# Patient Record
Sex: Female | Born: 1945 | Race: Black or African American | Hispanic: No | Marital: Single | Smoking: Never smoker
Health system: Southern US, Community
[De-identification: ages and names within clinical notes are randomized; demographics above are authoritative.]

## PROBLEM LIST (undated history)

## (undated) DIAGNOSIS — I471 Supraventricular tachycardia, unspecified: Secondary | ICD-10-CM

## (undated) DIAGNOSIS — E559 Vitamin D deficiency, unspecified: Secondary | ICD-10-CM

## (undated) DIAGNOSIS — R000 Tachycardia, unspecified: Secondary | ICD-10-CM

## (undated) DIAGNOSIS — I6523 Occlusion and stenosis of bilateral carotid arteries: Secondary | ICD-10-CM

## (undated) DIAGNOSIS — I2510 Atherosclerotic heart disease of native coronary artery without angina pectoris: Secondary | ICD-10-CM

## (undated) DIAGNOSIS — M545 Low back pain, unspecified: Secondary | ICD-10-CM

## (undated) DIAGNOSIS — K635 Polyp of colon: Secondary | ICD-10-CM

## (undated) DIAGNOSIS — R32 Unspecified urinary incontinence: Secondary | ICD-10-CM

## (undated) DIAGNOSIS — I5189 Other ill-defined heart diseases: Secondary | ICD-10-CM

## (undated) DIAGNOSIS — R202 Paresthesia of skin: Secondary | ICD-10-CM

## (undated) DIAGNOSIS — F419 Anxiety disorder, unspecified: Secondary | ICD-10-CM

## (undated) DIAGNOSIS — E538 Deficiency of other specified B group vitamins: Secondary | ICD-10-CM

## (undated) DIAGNOSIS — N8110 Cystocele, unspecified: Secondary | ICD-10-CM

## (undated) DIAGNOSIS — E039 Hypothyroidism, unspecified: Secondary | ICD-10-CM

## (undated) DIAGNOSIS — K219 Gastro-esophageal reflux disease without esophagitis: Secondary | ICD-10-CM

## (undated) DIAGNOSIS — E669 Obesity, unspecified: Secondary | ICD-10-CM

## (undated) DIAGNOSIS — G459 Transient cerebral ischemic attack, unspecified: Secondary | ICD-10-CM

## (undated) DIAGNOSIS — Z7902 Long term (current) use of antithrombotics/antiplatelets: Secondary | ICD-10-CM

## (undated) DIAGNOSIS — I639 Cerebral infarction, unspecified: Secondary | ICD-10-CM

## (undated) DIAGNOSIS — J45909 Unspecified asthma, uncomplicated: Secondary | ICD-10-CM

## (undated) DIAGNOSIS — G8929 Other chronic pain: Secondary | ICD-10-CM

## (undated) DIAGNOSIS — F32A Depression, unspecified: Secondary | ICD-10-CM

## (undated) DIAGNOSIS — I10 Essential (primary) hypertension: Secondary | ICD-10-CM

## (undated) DIAGNOSIS — E785 Hyperlipidemia, unspecified: Secondary | ICD-10-CM

## (undated) DIAGNOSIS — N3281 Overactive bladder: Secondary | ICD-10-CM

## (undated) DIAGNOSIS — E119 Type 2 diabetes mellitus without complications: Secondary | ICD-10-CM

## (undated) DIAGNOSIS — M1990 Unspecified osteoarthritis, unspecified site: Secondary | ICD-10-CM

## (undated) HISTORY — DX: Supraventricular tachycardia: I47.1

## (undated) HISTORY — DX: Atherosclerotic heart disease of native coronary artery without angina pectoris: I25.10

## (undated) HISTORY — PX: CARPAL TUNNEL RELEASE: SHX101

## (undated) HISTORY — DX: Supraventricular tachycardia, unspecified: I47.10

## (undated) HISTORY — PX: ABDOMINAL HYSTERECTOMY: SHX81

## (undated) HISTORY — PX: CERVICAL FUSION: SHX112

## (undated) HISTORY — DX: Hyperlipidemia, unspecified: E78.5

## (undated) HISTORY — DX: Essential (primary) hypertension: I10

## (undated) HISTORY — DX: Tachycardia, unspecified: R00.0

## (undated) HISTORY — PX: TONSILLECTOMY: SUR1361

## (undated) HISTORY — PX: TUBAL LIGATION: SHX77

## (undated) HISTORY — PX: KNEE ARTHROSCOPY: SUR90

## (undated) SURGERY — ECHOCARDIOGRAM, TRANSESOPHAGEAL
Anesthesia: Choice

---

## 1997-05-16 HISTORY — PX: PARTIAL HYSTERECTOMY: SHX80

## 2005-01-13 ENCOUNTER — Ambulatory Visit: Payer: Self-pay | Admitting: General Practice

## 2005-05-28 ENCOUNTER — Emergency Department: Payer: Self-pay | Admitting: Emergency Medicine

## 2005-10-29 ENCOUNTER — Ambulatory Visit: Payer: Self-pay | Admitting: Orthopedic Surgery

## 2005-12-14 ENCOUNTER — Ambulatory Visit: Payer: Self-pay | Admitting: Orthopaedic Surgery

## 2008-05-16 DIAGNOSIS — R000 Tachycardia, unspecified: Secondary | ICD-10-CM

## 2008-05-16 HISTORY — DX: Tachycardia, unspecified: R00.0

## 2009-05-16 DIAGNOSIS — I2510 Atherosclerotic heart disease of native coronary artery without angina pectoris: Secondary | ICD-10-CM

## 2009-05-16 HISTORY — DX: Atherosclerotic heart disease of native coronary artery without angina pectoris: I25.10

## 2009-05-16 HISTORY — PX: CARDIAC CATHETERIZATION: SHX172

## 2012-05-02 ENCOUNTER — Emergency Department: Payer: Self-pay | Admitting: Emergency Medicine

## 2012-05-02 LAB — COMPREHENSIVE METABOLIC PANEL
Anion Gap: 7 (ref 7–16)
BUN: 13 mg/dL (ref 7–18)
Calcium, Total: 9.1 mg/dL (ref 8.5–10.1)
Co2: 26 mmol/L (ref 21–32)
EGFR (African American): >60
EGFR (Non-African Amer.): >60
Potassium: 3.3 mmol/L — ABNORMAL LOW (ref 3.5–5.1)
SGOT(AST): 25 U/L (ref 15–37)
Sodium: 140 mmol/L (ref 136–145)

## 2012-05-02 LAB — CBC
HCT: 43.8 % (ref 35.0–47.0)
HGB: 15.3 g/dL (ref 12.0–16.0)
MCH: 29.2 pg (ref 26.0–34.0)
MCHC: 34.9 g/dL (ref 32.0–36.0)
MCV: 83 fL (ref 80–100)
Platelet: 329 10*3/uL (ref 150–440)
RBC: 5.25 10*6/uL — ABNORMAL HIGH (ref 3.80–5.20)
RDW: 12.7 % (ref 11.5–14.5)
WBC: 5.8 10*3/uL (ref 3.6–11.0)

## 2012-05-02 LAB — MAGNESIUM: Magnesium: 1.8 mg/dL

## 2012-05-02 LAB — TROPONIN I: Troponin-I: 0.02 ng/mL

## 2012-05-02 LAB — CK TOTAL AND CKMB (NOT AT ARMC): CK, Total: 364 U/L — ABNORMAL HIGH (ref 21–215)

## 2012-05-14 ENCOUNTER — Ambulatory Visit (INDEPENDENT_AMBULATORY_CARE_PROVIDER_SITE_OTHER): Payer: BC Managed Care – PPO | Admitting: Cardiovascular Disease

## 2012-05-14 ENCOUNTER — Encounter: Payer: Self-pay | Admitting: Cardiovascular Disease

## 2012-05-14 VITALS — BP 140/80 | HR 71 | Ht 62.0 in | Wt 165.8 lb

## 2012-05-14 DIAGNOSIS — I471 Supraventricular tachycardia: Secondary | ICD-10-CM

## 2012-05-14 DIAGNOSIS — I2510 Atherosclerotic heart disease of native coronary artery without angina pectoris: Secondary | ICD-10-CM

## 2012-05-14 DIAGNOSIS — R002 Palpitations: Secondary | ICD-10-CM

## 2012-05-14 DIAGNOSIS — I10 Essential (primary) hypertension: Secondary | ICD-10-CM

## 2012-05-14 NOTE — Patient Instructions (Addendum)
Your physician has requested that you have an echocardiogram. Echocardiography is a painless test that uses sound waves to create images of your heart. It provides your doctor with information about the size and shape of your heart and how well your heart's chambers and valves are working. This procedure takes approximately one hour. There are no restrictions for this procedure.  Follow up in 3 months.  

## 2012-05-19 ENCOUNTER — Encounter: Payer: Self-pay | Admitting: Cardiovascular Disease

## 2012-05-19 DIAGNOSIS — I2510 Atherosclerotic heart disease of native coronary artery without angina pectoris: Secondary | ICD-10-CM | POA: Insufficient documentation

## 2012-05-19 DIAGNOSIS — E785 Hyperlipidemia, unspecified: Secondary | ICD-10-CM | POA: Insufficient documentation

## 2012-05-19 DIAGNOSIS — I471 Supraventricular tachycardia, unspecified: Secondary | ICD-10-CM | POA: Insufficient documentation

## 2012-05-19 DIAGNOSIS — I10 Essential (primary) hypertension: Secondary | ICD-10-CM | POA: Insufficient documentation

## 2012-05-19 NOTE — Assessment & Plan Note (Signed)
Her blood pressure is well controlled on current medications. 

## 2012-05-19 NOTE — Assessment & Plan Note (Signed)
The patient had a recent episode of supraventricular tachycardia. Her episodes overall doesn't seem to be very frequent. We discussed vagal maneuvers today to terminate the tachycardia if needed. She was recently started on diltiazem to take in addition to metoprolol. If she develops any further frequent episodes, an antiarrhythmic medication or catheter ablation can be considered. I will request an echocardiogram to make sure she does not have any structural heart abnormalities.

## 2012-05-19 NOTE — Progress Notes (Signed)
HPI  This is a 67 year old female who was referred from the emergency room at [facility] for evaluation of supraventricular tachycardia. The patient has known history of paroxysmal supraventricular tachycardia which was diagnosed about 2 years ago. Since that time she reports having 3 episodes that required emergency room visits and termination with adenosine. She has been evaluated at [facility] in the past and was treated with metoprolol. She had a cardiac catheterization done in 2011 which showed a 50% LAD stenosis without any other obstructive disease. She was evaluated with a nuclear stress test in October of 2013 due to atypical chest pain. The stress test was normal. She presented recently to [facility] with tachycardia and was noted to have narrow complex tachycardia with a heart rate of 140 beats per minute. She converted to normal sinus rhythm with adenosine. Her labs were unremarkable except for mild hypokalemia with a potassium of 3.3. Cardiac enzymes were normal. D-dimer and TSH were both normal as well. Chest x-ray showed no acute abnormalities. Diltiazem extended release 120 mg once daily was added. She denies any chest pain or dyspnea at this time.  Allergies  Allergen Reactions  . Sulfa Antibiotics      Current Outpatient Prescriptions on File Prior to Visit  Medication Sig Dispense Refill  . diltiazem (CARDIZEM) 120 MG tablet Take 120 mg by mouth every 12 (twelve) hours.      Marland Kitchen estrogens, conjugated, (PREMARIN) 0.3 MG tablet Take 0.3 mg by mouth daily. Take daily for 21 days then do not take for 7 days.      Marland Kitchen lovastatin (MEVACOR) 40 MG tablet Take 40 mg by mouth at bedtime.      . metoprolol succinate (TOPROL-XL) 50 MG 24 hr tablet Take 50 mg by mouth at bedtime. Take with or immediately following a meal.         Past Medical History  Diagnosis Date  . Tachycardia 2010  . PSVT (paroxysmal supraventricular tachycardia)   . Coronary artery disease 2011    Cardiac cath in 2011 at [facility]: 50%  mid LAD stenosis without evidence of obstructive disease. Normal ejection fraction. Nuclear stress test done in October of 2013 4 atypical chest pain was normal.  . Hyperlipidemia   . Hypertension      Past Surgical History  Procedure Date  . Partial hysterectomy 1999  . Tonsillectomy   . Cardiac catheterization 2011    50% to the mid LAD lesion     Family History  Problem Relation Age of Onset  . Hypertension Mother   . Heart attack Father 75    MI  . Heart disease Father   . Hypertension Father   . Hypertension Sister   . Hyperlipidemia Sister   . Heart attack Brother     cardiac arrest  . Hypertension Sister   . Hyperlipidemia Sister      History   Social History  . Marital Status: Married    Spouse Name: N/A    Number of Children: N/A  . Years of Education: N/A   Occupational History  . Not on file.   Social History Main Topics  . Smoking status: Never Smoker   . Smokeless tobacco: Not on file  . Alcohol Use: No  . Drug Use: No  . Sexually Active: Not on file   Other Topics Concern  . Not on file   Social History Narrative  . No narrative on file     ROS Constitutional: Negative for fever, chills, diaphoresis,  activity change, appetite change and fatigue.  HENT: Negative for hearing loss, nosebleeds, congestion, sore throat, facial swelling, drooling, trouble swallowing, neck pain, voice change, sinus pressure and tinnitus.  Eyes: Negative for photophobia, pain, discharge and visual disturbance.  Respiratory: Negative for apnea, cough, chest tightness, shortness of breath and wheezing.  Cardiovascular: Negative for chest pain, palpitations and leg swelling.  Gastrointestinal: Negative for nausea, vomiting, abdominal pain, diarrhea, constipation, blood in stool and abdominal distention.  Genitourinary: Negative for dysuria, urgency, frequency, hematuria and decreased urine volume.  Musculoskeletal: Negative for myalgias, back pain, joint swelling,  arthralgias and gait problem.  Skin: Negative for color change, pallor, rash and wound.  Neurological: Negative for dizziness, tremors, seizures, syncope, speech difficulty, weakness, light-headedness, numbness and headaches.  Psychiatric/Behavioral: Negative for suicidal ideas, hallucinations, behavioral problems and agitation. The patient is not nervous/anxious.     PHYSICAL EXAM   BP 140/80  Pulse 71  Ht 5\' 2"  (1.575 m)  Wt 165 lb 12 oz (75.184 kg)  BMI 30.32 kg/m2 Constitutional: She is oriented to person, place, and time. She appears well-developed and well-nourished. No distress.  HENT: No nasal discharge.  Head: Normocephalic and atraumatic.  Eyes: Pupils are equal and round. Right eye exhibits no discharge. Left eye exhibits no discharge.  Neck: Normal range of motion. Neck supple. No JVD present. No thyromegaly present.  Cardiovascular: Normal rate, regular rhythm, normal heart sounds. Exam reveals no gallop and no friction rub. No murmur heard.  Pulmonary/Chest: Effort normal and breath sounds normal. No stridor. No respiratory distress. She has no wheezes. She has no rales. She exhibits no tenderness.  Abdominal: Soft. Bowel sounds are normal. She exhibits no distension. There is no tenderness. There is no rebound and no guarding.  Musculoskeletal: Normal range of motion. She exhibits no edema and no tenderness.  Neurological: She is alert and oriented to person, place, and time. Coordination normal.  Skin: Skin is warm and dry. No rash noted. She is not diaphoretic. No erythema. No pallor.  Psychiatric: She has a normal mood and affect. Her behavior is normal. Judgment and thought content normal.     EKG: Normal sinus rhythm.   ASSESSMENT AND PLAN

## 2012-05-19 NOTE — Assessment & Plan Note (Signed)
She has no symptoms suggestive of angina. Recent stress test in October  was normal.

## 2012-05-29 ENCOUNTER — Other Ambulatory Visit (INDEPENDENT_AMBULATORY_CARE_PROVIDER_SITE_OTHER): Payer: BC Managed Care – PPO

## 2012-05-29 ENCOUNTER — Other Ambulatory Visit: Payer: Self-pay

## 2012-05-29 DIAGNOSIS — I471 Supraventricular tachycardia: Secondary | ICD-10-CM

## 2012-08-20 ENCOUNTER — Ambulatory Visit: Payer: BC Managed Care – PPO | Admitting: Cardiovascular Disease

## 2012-12-04 ENCOUNTER — Ambulatory Visit (INDEPENDENT_AMBULATORY_CARE_PROVIDER_SITE_OTHER): Payer: BC Managed Care – PPO | Admitting: Cardiovascular Disease

## 2012-12-04 ENCOUNTER — Encounter: Payer: Self-pay | Admitting: Cardiovascular Disease

## 2012-12-04 VITALS — BP 138/84 | HR 79 | Ht 62.0 in | Wt 174.8 lb

## 2012-12-04 DIAGNOSIS — R609 Edema, unspecified: Secondary | ICD-10-CM

## 2012-12-04 DIAGNOSIS — I471 Supraventricular tachycardia: Secondary | ICD-10-CM

## 2012-12-04 DIAGNOSIS — I2510 Atherosclerotic heart disease of native coronary artery without angina pectoris: Secondary | ICD-10-CM

## 2012-12-04 DIAGNOSIS — I25810 Atherosclerosis of coronary artery bypass graft(s) without angina pectoris: Secondary | ICD-10-CM

## 2012-12-04 DIAGNOSIS — R600 Localized edema: Secondary | ICD-10-CM | POA: Insufficient documentation

## 2012-12-04 MED ORDER — DILTIAZEM HCL ER COATED BEADS 120 MG PO CP24: 120.0000 mg | ORAL_CAPSULE | Freq: Every day | ORAL | Status: DC

## 2012-12-04 MED ORDER — METOPROLOL SUCCINATE ER 100 MG PO TB24: 100.0000 mg | ORAL_TABLET | Freq: Every day | ORAL | Status: DC

## 2012-12-04 NOTE — Progress Notes (Signed)
HPI  This is a 67 year old female who is here for a follow up regarding paroxysmal supraventricular tachycardia. The patient has known history of paroxysmal supraventricular tachycardia which was diagnosed in 2011-2012 . Since that time she reports having 3 episodes that required emergency room visits and termination with adenosine. She has been evaluated at [facility] in the past and was treated with metoprolol. She had a cardiac catheterization done in 2011 which showed a 50% LAD stenosis without any other obstructive disease. She was evaluated with a nuclear stress test in October of 2013 due to atypical chest pain. The stress test was normal. She presented last year to [facility] with tachycardia and was noted to have narrow complex tachycardia with a heart rate of 140 beats per minute. She converted to normal sinus rhythm with adenosine. Her labs were unremarkable except for mild hypokalemia with a potassium of 3.3. Cardiac enzymes were normal. D-dimer and TSH were both normal as well.  Diltiazem extended release as needed. No episodes since then but she reports significant LE edema.     Allergies  Allergen Reactions  . Sulfa Antibiotics      Current Outpatient Prescriptions on File Prior to Visit  Medication Sig Dispense Refill  . aspirin 81 MG tablet Take 81 mg by mouth daily.      Marland Kitchen diltiazem (CARDIZEM) 120 MG tablet Take 120 mg by mouth every 12 (twelve) hours.      Marland Kitchen estrogens, conjugated, (PREMARIN) 0.3 MG tablet Take 0.3 mg by mouth daily. Take daily for 21 days then do not take for 7 days.      Marland Kitchen LORazepam (ATIVAN) 0.5 MG tablet Take 0.5 mg by mouth daily as needed.      . lovastatin (MEVACOR) 40 MG tablet Take 40 mg by mouth at bedtime.      . metoprolol succinate (TOPROL-XL) 50 MG 24 hr tablet Take 50 mg by mouth at bedtime. Take with or immediately following a meal.      . Misc Natural Products (GINKOGIN PO) Take by mouth daily.      . Multiple Vitamin (MULTIVITAMIN) tablet Take 1 tablet  by mouth daily.      . niacin 500 MG tablet Take 500 mg by mouth daily with breakfast.       No current facility-administered medications on file prior to visit.     Past Medical History  Diagnosis Date  . Tachycardia 2010  . PSVT (paroxysmal supraventricular tachycardia)   . Coronary artery disease 2011    Cardiac cath in 2011 at [facility]: 50% mid LAD stenosis without evidence of obstructive disease. Normal ejection fraction. Nuclear stress test done in October of 2013 4 atypical chest pain was normal.  . Hyperlipidemia   . Hypertension      Past Surgical History  Procedure Laterality Date  . Partial hysterectomy  1999  . Tonsillectomy    . Cardiac catheterization  2011    50% to the mid LAD lesion     Family History  Problem Relation Age of Onset  . Hypertension Mother   . Heart attack Father 29    MI  . Heart disease Father   . Hypertension Father   . Hypertension Sister   . Hyperlipidemia Sister   . Heart attack Brother     cardiac arrest  . Hypertension Sister   . Hyperlipidemia Sister      History   Social History  . Marital Status: Married    Spouse Name: N/A  Number of Children: N/A  . Years of Education: N/A   Occupational History  . Not on file.   Social History Main Topics  . Smoking status: Never Smoker   . Smokeless tobacco: Not on file  . Alcohol Use: No  . Drug Use: No  . Sexually Active: Not on file   Other Topics Concern  . Not on file   Social History Narrative  . No narrative on file        PHYSICAL EXAM   There were no vitals taken for this visit. Constitutional: She is oriented to person, place, and time. She appears well-developed and well-nourished. No distress.  HENT: No nasal discharge.  Head: Normocephalic and atraumatic.  Eyes: Pupils are equal and round. Right eye exhibits no discharge. Left eye exhibits no discharge.  Neck: Normal range of motion. Neck supple. No JVD present. No thyromegaly present.    Cardiovascular: Normal rate, regular rhythm, normal heart sounds. Exam reveals no gallop and no friction rub. No murmur heard.  Pulmonary/Chest: Effort normal and breath sounds normal. No stridor. No respiratory distress. She has no wheezes. She has no rales. She exhibits no tenderness.  Abdominal: Soft. Bowel sounds are normal. She exhibits no distension. There is no tenderness. There is no rebound and no guarding.  Musculoskeletal: Normal range of motion. She exhibits +1  edema and no tenderness.  Neurological: She is alert and oriented to person, place, and time. Coordination normal.  Skin: Skin is warm and dry. No rash noted. She is not diaphoretic. No erythema. No pallor.  Psychiatric: She has a normal mood and affect. Her behavior is normal. Judgment and thought content normal.     EKG: Normal sinus rhythm.   ASSESSMENT AND PLAN

## 2012-12-04 NOTE — Assessment & Plan Note (Signed)
No recurrent episodes since last visit. Due to LE edema, I will decrease Diltiazem ER to once daily and double the dose of Metoprolol.

## 2012-12-04 NOTE — Patient Instructions (Addendum)
Decrease Diltiazem ER to 120 mg once daily.  Increase Metoprolol ER to 100 mg once daily.   Follow up in 3 months.

## 2012-12-04 NOTE — Assessment & Plan Note (Signed)
This is bilateral and started after initiating treatment with Diltiazem which is likely the culprit. Thus, I will decrease the dose by half and reevaluate. If this continues to be an issue, I might have to stop this or add small dose thiazide diuretic.

## 2012-12-04 NOTE — Assessment & Plan Note (Signed)
No symptoms of angina 

## 2012-12-10 ENCOUNTER — Telehealth: Payer: Self-pay

## 2012-12-10 NOTE — Telephone Encounter (Signed)
Pt is calling you back w readings/BP 179/92.HR 86

## 2012-12-10 NOTE — Telephone Encounter (Signed)
Pt saw dr Kirke Corin on Tues last week, had some medication changes, states she has been light headed and dizzy all weekend. Please call

## 2012-12-10 NOTE — Telephone Encounter (Signed)
Pt saw Dr Kirke Corin on 12/04/12 he decreased Diltiazem ER to 120 mg once daily and Increased Metoprolol ER to 100 mg once daily. Pt reports feeling dizzy and lightheaded over the weekend.  Pt states she does have a BP monitor at home and at work, but she has not checked.  Pt is going to check BP and HR and call back with results.

## 2012-12-10 NOTE — Telephone Encounter (Signed)
BP was 138/84 on 12/04/12 when pt saw Dr Kirke Corin.

## 2012-12-10 NOTE — Telephone Encounter (Signed)
Per Dr Mariah Milling keep log of BP readings and call back to report after several days worth. Called spoke with pt when she checked BP at home this even 136/84.  She will continue to montior all week and call on Friday to report reading.  Will await call back.

## 2012-12-20 ENCOUNTER — Telehealth: Payer: Self-pay

## 2012-12-20 NOTE — Telephone Encounter (Signed)
Called patient and advised that Dr.Arida has reviewed her BP readings and feels they are stable and would like her to stay on the same medications. She will continue to monitor her BP and call us with any changes.

## 2012-12-20 NOTE — Telephone Encounter (Signed)
reasonable readings. Continue same medications.

## 2012-12-20 NOTE — Telephone Encounter (Signed)
Pt calling with BP readings: 12/10/12 179/92 am, 136/85 pm 12/11/12 139/81 am, 134/85 pm 12/12/12 148/75 am, 127/83 and 138/83 pm 12/13/12 139/86 am, 129/80 pm  12/14/12 130/81am, 123/83 pm

## 2013-01-23 ENCOUNTER — Telehealth: Payer: Self-pay

## 2013-01-23 NOTE — Telephone Encounter (Signed)
Pt has question regarding side effects of her medication, states she feels "drunk" during the day, very wobbly, running into the wall, dizzy headed. Wants to know if it is the medication. States she is not like this before she goes to bed, she takes her medicine at night, but when she wakes up she is this way.

## 2013-01-23 NOTE — Telephone Encounter (Signed)
d/w pt about her sxms of "feeling drunk" in the AM. Pt states she takes all of her meds before bedtime 9-10:30 ish, says feels drunk in the AM. I advised pt to try taking the diltiazem 120 mg in the AM and Toprol xl 100 mg in the PM. I will d/w DR. Gollan and cb later with his recommendation. Pt said thank you.

## 2013-01-23 NOTE — Telephone Encounter (Signed)
lmom d/w Dr. Mariah Milling about call earlier about her have a "drunk feeling" in the AM, see phone note from earlier today. Per Dr. Mariah Milling since Dr. Kirke Corin is pt's cardio doc and he knows pt and her meds better than he does to let Dr. Kirke Corin handle this.

## 2013-01-23 NOTE — Telephone Encounter (Signed)
Does she record her BP and heart rate? If not, she needs to start doing that and let us know the readings. We need to make sure that she is not bradycardiac.

## 2013-01-24 NOTE — Telephone Encounter (Signed)
Spoke w/ pt.  She will monitor her BP and heart rate once or twice a day, at the same time of day, and when she is feeling symptomatic. She will call the office in a few days to report her readings.

## 2013-03-04 ENCOUNTER — Encounter: Payer: Self-pay | Admitting: Cardiovascular Disease

## 2013-03-04 ENCOUNTER — Ambulatory Visit (INDEPENDENT_AMBULATORY_CARE_PROVIDER_SITE_OTHER): Payer: BC Managed Care – PPO | Admitting: Cardiovascular Disease

## 2013-03-04 VITALS — BP 150/85 | HR 65 | Ht 62.0 in | Wt 175.5 lb

## 2013-03-04 DIAGNOSIS — E785 Hyperlipidemia, unspecified: Secondary | ICD-10-CM

## 2013-03-04 DIAGNOSIS — I2510 Atherosclerotic heart disease of native coronary artery without angina pectoris: Secondary | ICD-10-CM

## 2013-03-04 DIAGNOSIS — I471 Supraventricular tachycardia: Secondary | ICD-10-CM

## 2013-03-04 DIAGNOSIS — I10 Essential (primary) hypertension: Secondary | ICD-10-CM

## 2013-03-04 NOTE — Patient Instructions (Signed)
Continue same medications.   Your physician wants you to follow-up in: 6 months.  You will receive a reminder letter in the mail two months in advance. If you don't receive a letter, please call our office to schedule the follow-up appointment.  

## 2013-03-04 NOTE — Progress Notes (Signed)
HPI  This is a 67 year old [place] who is here for a follow up regarding paroxysmal supraventricular tachycardia. The patient has known history of paroxysmal supraventricular tachycardia which was diagnosed in 2011-2012 . Since that time she had 3 episodes that required emergency room visits and termination with adenosine. She has been evaluated at [facility] in the past and was treated with metoprolol. She had a cardiac catheterization done in 2011 which showed a 50% LAD stenosis without any other obstructive disease. She was evaluated with a nuclear stress test in October of 2013 due to atypical chest pain. The stress test was normal. She presented in 2013 to [facility] with tachycardia and was noted to have narrow complex tachycardia with a heart rate of 140 beats per minute. She converted to normal sinus rhythm with adenosine. Her labs were unremarkable except for mild hypokalemia with a potassium of 3.3. Cardiac enzymes were normal. D-dimer and TSH were both normal as well.  Diltiazem extended release was added. The dose was subsequently decreased due to LE edema. Toprol was increased to 100 mg once daily. She had dizziness after making this change for few days but symptoms resolved. She has been doing very well with no recurrent tachycardia.     Allergies  Allergen Reactions  . Sulfa Antibiotics      Current Outpatient Prescriptions on File Prior to Visit  Medication Sig Dispense Refill  . aspirin 81 MG tablet Take 81 mg by mouth daily.      Marland Kitchen diltiazem (CARDIZEM CD) 120 MG 24 hr capsule Take 1 capsule (120 mg total) by mouth daily.  30 capsule  6  . diltiazem (CARDIZEM) 30 MG tablet Take 30 mg by mouth 4 (four) times daily as needed.      Marland Kitchen estradiol (ESTRACE) 0.5 MG tablet Take 0.5 mg by mouth daily.      Marland Kitchen lovastatin (MEVACOR) 40 MG tablet Take 40 mg by mouth at bedtime.      . metoprolol succinate (TOPROL-XL) 100 MG 24 hr tablet Take 1 tablet (100 mg total) by mouth daily. Take with or immediately  following a meal.  30 tablet  6  . Misc Natural Products (GINKOGIN PO) Take by mouth daily.      . Multiple Vitamin (MULTIVITAMIN) tablet Take 1 tablet by mouth daily.      . niacin 500 MG tablet Take 500 mg by mouth daily.       . trospium (SANCTURA) 20 MG tablet Take 20 mg by mouth at bedtime.       No current facility-administered medications on file prior to visit.     Past Medical History  Diagnosis Date  . Tachycardia 2010  . PSVT (paroxysmal supraventricular tachycardia)   . Coronary artery disease 2011    Cardiac cath in 2011 at [facility]: 50% mid LAD stenosis without evidence of obstructive disease. Normal ejection fraction. Nuclear stress test done in October of 2013 4 atypical chest pain was normal.  . Hyperlipidemia   . Hypertension      Past Surgical History  Procedure Laterality Date  . Partial hysterectomy  1999  . Tonsillectomy    . Cardiac catheterization  2011    50% to the mid LAD lesion     Family History  Problem Relation Age of Onset  . Hypertension Mother   . Heart attack Father 3    MI  . Heart disease Father   . Hypertension Father   . Hypertension Sister   . Hyperlipidemia Sister   .  Heart attack Brother     cardiac arrest  . Hypertension Sister   . Hyperlipidemia Sister      History   Social History  . Marital Status: Married    Spouse Name: N/A    Number of Children: N/A  . Years of Education: N/A   Occupational History  . Not on file.   Social History Main Topics  . Smoking status: Never Smoker   . Smokeless tobacco: Not on file  . Alcohol Use: No  . Drug Use: No  . Sexual Activity: Not on file   Other Topics Concern  . Not on file   Social History Narrative  . No narrative on file        PHYSICAL EXAM   BP 150/85  Pulse 65  Ht 5\' 2"  (1.575 m)  Wt 175 lb 8 oz (79.606 kg)  BMI 32.09 kg/m2 Constitutional: She is oriented to person, place, and time. She appears well-developed and well-nourished. No distress.    HENT: No nasal discharge.  Head: Normocephalic and atraumatic.  Eyes: Pupils are equal and round. Right eye exhibits no discharge. Left eye exhibits no discharge.  Neck: Normal range of motion. Neck supple. No JVD present. No thyromegaly present.  Cardiovascular: Normal rate, regular rhythm, normal heart sounds. Exam reveals no gallop and no friction rub. No murmur heard.  Pulmonary/Chest: Effort normal and breath sounds normal. No stridor. No respiratory distress. She has no wheezes. She has no rales. She exhibits no tenderness.  Abdominal: Soft. Bowel sounds are normal. She exhibits no distension. There is no tenderness. There is no rebound and no guarding.  Musculoskeletal: Normal range of motion. She exhibits trace  edema and no tenderness.  Neurological: She is alert and oriented to person, place, and time. Coordination normal.  Skin: Skin is warm and dry. No rash noted. She is not diaphoretic. No erythema. No pallor.  Psychiatric: She has a normal mood and affect. Her behavior is normal. Judgment and thought content normal.     EKG: Sinus  Rhythm  Low voltage in precordial leads.   -  Nonspecific T-abnormality.   ABNORMAL    ASSESSMENT AND PLAN

## 2013-03-06 ENCOUNTER — Encounter: Payer: Self-pay | Admitting: Cardiovascular Disease

## 2013-03-06 NOTE — Assessment & Plan Note (Signed)
On Lovastatin 

## 2013-03-06 NOTE — Assessment & Plan Note (Signed)
No recurrent arrhythmia since most recent visit. Continue treatment with Toprol and Diltiazem.

## 2013-03-06 NOTE — Assessment & Plan Note (Signed)
Nonobstructive. No symptoms suggestive of angina. Continue medical therapy.

## 2013-03-06 NOTE — Assessment & Plan Note (Signed)
BP is high today but usually more controlled. Continue to monitor.

## 2013-03-12 ENCOUNTER — Ambulatory Visit: Payer: Self-pay | Admitting: Gastroenterology

## 2013-03-12 ENCOUNTER — Ambulatory Visit: Payer: BC Managed Care – PPO | Admitting: Cardiovascular Disease

## 2013-03-13 LAB — PATHOLOGY REPORT

## 2013-07-22 ENCOUNTER — Other Ambulatory Visit: Payer: Self-pay | Admitting: Cardiovascular Disease

## 2013-08-21 ENCOUNTER — Other Ambulatory Visit: Payer: Self-pay

## 2013-08-21 MED ORDER — METOPROLOL SUCCINATE ER 100 MG PO TB24: ORAL_TABLET | ORAL | Status: DC

## 2013-09-02 ENCOUNTER — Encounter: Payer: Self-pay | Admitting: Cardiovascular Disease

## 2013-09-02 ENCOUNTER — Ambulatory Visit (INDEPENDENT_AMBULATORY_CARE_PROVIDER_SITE_OTHER): Payer: BC Managed Care – PPO | Admitting: Cardiovascular Disease

## 2013-09-02 VITALS — BP 153/86 | HR 77 | Ht 62.0 in | Wt 177.0 lb

## 2013-09-02 DIAGNOSIS — I10 Essential (primary) hypertension: Secondary | ICD-10-CM

## 2013-09-02 DIAGNOSIS — I2510 Atherosclerotic heart disease of native coronary artery without angina pectoris: Secondary | ICD-10-CM

## 2013-09-02 DIAGNOSIS — E785 Hyperlipidemia, unspecified: Secondary | ICD-10-CM

## 2013-09-02 DIAGNOSIS — I471 Supraventricular tachycardia: Secondary | ICD-10-CM

## 2013-09-02 NOTE — Assessment & Plan Note (Addendum)
She reports significant flushing with niacin. Thus, I stopped his medication. Continue treatment with lovastatin with a target LDL of less than 100.

## 2013-09-02 NOTE — Patient Instructions (Signed)
Stop taking Niacin.  Continue other medications.   Your physician wants you to follow-up in: 6 months.  You will receive a reminder letter in the mail two months in advance. If you don't receive a letter, please call our office to schedule the follow-up appointment.

## 2013-09-02 NOTE — Assessment & Plan Note (Signed)
Blood pressure is elevated. She reports that her readings at home. Continue to monitor.

## 2013-09-02 NOTE — Assessment & Plan Note (Signed)
Previous catheterization in 2007 showed moderate mid LAD stenosis. Continue medical therapy. She has no symptoms of angina.

## 2013-09-02 NOTE — Assessment & Plan Note (Signed)
She had no recurrent tachycardia trunk dose of Toprol and diltiazem. Continue same medications. Catheter ablation can be considered for recurrent episodes.

## 2013-09-02 NOTE — Progress Notes (Signed)
Primary care physician: Dr. Allyson Sabal   HPI  This is a 68 year old female who is here for a follow up regarding paroxysmal supraventricular tachycardia. The patient has known history of paroxysmal supraventricular tachycardia which was diagnosed in 2011-2012 . Since that time she had 3 episodes that required emergency room visits and termination with adenosine. She has been evaluated at [facility] in the past and was treated with metoprolol. She had a cardiac catheterization done in 2011 which showed a 50% LAD stenosis without any other obstructive disease. She was evaluated with a nuclear stress test in October of 2013 due to atypical chest pain. The stress test was normal. She presented in 2013 to [facility] with tachycardia and was noted to have narrow complex tachycardia with a heart rate of 140 beats per minute. She converted to normal sinus rhythm with adenosine. Her labs were unremarkable except for mild hypokalemia with a potassium of 3.3. Cardiac enzymes were normal. D-dimer and TSH were both normal as well.  Diltiazem extended release was added. The dose was subsequently decreased due to LE edema. Toprol was increased to 100 mg once daily. She had no recurrent tachycardia on this regimen. She has been doing well. She reports significant flushing when she takes niacin.    Allergies  Allergen Reactions  . Sulfa Antibiotics      Current Outpatient Prescriptions on File Prior to Visit  Medication Sig Dispense Refill  . aspirin 81 MG tablet Take 81 mg by mouth daily.      . Chlorpheniramine Maleate (ALLERGY PO) Take by mouth daily.      Marland Kitchen diltiazem (CARDIZEM CD) 120 MG 24 hr capsule Take 1 capsule (120 mg total) by mouth daily.  30 capsule  6  . diltiazem (CARDIZEM) 30 MG tablet Take 30 mg by mouth 4 (four) times daily as needed.      Marland Kitchen estradiol (ESTRACE) 0.5 MG tablet Take 0.5 mg by mouth daily.      Marland Kitchen lovastatin (MEVACOR) 40 MG tablet Take 40 mg by mouth at bedtime.      . metoprolol succinate  (TOPROL-XL) 100 MG 24 hr tablet TAKE ONE TABLET BY MOUTH ONCE DAILY WITH OR IMMEDIATELY FOLLOWING A MEAL.  30 tablet  3  . Misc Natural Products (GINKOGIN PO) Take by mouth daily.      . Multiple Vitamin (MULTIVITAMIN) tablet Take 1 tablet by mouth daily.      . niacin 500 MG tablet Take 500 mg by mouth daily.       . trospium (SANCTURA) 20 MG tablet Take 20 mg by mouth at bedtime.       No current facility-administered medications on file prior to visit.     Past Medical History  Diagnosis Date  . Tachycardia 2010  . PSVT (paroxysmal supraventricular tachycardia)   . Coronary artery disease 2011    Cardiac cath in 2011 at [facility]: 50% mid LAD stenosis without evidence of obstructive disease. Normal ejection fraction. Nuclear stress test done in October of 2013 4 atypical chest pain was normal.  . Hyperlipidemia   . Hypertension      Past Surgical History  Procedure Laterality Date  . Partial hysterectomy  1999  . Tonsillectomy    . Cardiac catheterization  2011    50% to the mid LAD lesion     Family History  Problem Relation Age of Onset  . Hypertension Mother   . Heart attack Father 57    MI  . Heart disease Father   .  Hypertension Father   . Hypertension Sister   . Hyperlipidemia Sister   . Heart attack Brother     cardiac arrest  . Hypertension Sister   . Hyperlipidemia Sister      History   Social History  . Marital Status: Married    Spouse Name: N/A    Number of Children: N/A  . Years of Education: N/A   Occupational History  . Not on file.   Social History Main Topics  . Smoking status: Never Smoker   . Smokeless tobacco: Not on file  . Alcohol Use: No  . Drug Use: No  . Sexual Activity: Not on file   Other Topics Concern  . Not on file   Social History Narrative  . No narrative on file        PHYSICAL EXAM   BP 153/86  Pulse 77  Ht 5\' 2"  (1.575 m)  Wt 177 lb (80.287 kg)  BMI 32.37 kg/m2 Constitutional: She is oriented to person,  place, and time. She appears well-developed and well-nourished. No distress.  HENT: No nasal discharge.  Head: Normocephalic and atraumatic.  Eyes: Pupils are equal and round. Right eye exhibits no discharge. Left eye exhibits no discharge.  Neck: Normal range of motion. Neck supple. No JVD present. No thyromegaly present.  Cardiovascular: Normal rate, regular rhythm, normal heart sounds. Exam reveals no gallop and no friction rub. No murmur heard.  Pulmonary/Chest: Effort normal and breath sounds normal. No stridor. No respiratory distress. She has no wheezes. She has no rales. She exhibits no tenderness.  Abdominal: Soft. Bowel sounds are normal. She exhibits no distension. There is no tenderness. There is no rebound and no guarding.  Musculoskeletal: Normal range of motion. She exhibits trace  edema and no tenderness.  Neurological: She is alert and oriented to person, place, and time. Coordination normal.  Skin: Skin is warm and dry. No rash noted. She is not diaphoretic. No erythema. No pallor.  Psychiatric: She has a normal mood and affect. Her behavior is normal. Judgment and thought content normal.     EKG: Sinus  Rhythm  Low voltage in precordial leads.   -  Nonspecific T-abnormality.   ABNORMAL    ASSESSMENT AND PLAN

## 2013-12-20 ENCOUNTER — Other Ambulatory Visit: Payer: Self-pay | Admitting: Cardiovascular Disease

## 2014-03-04 ENCOUNTER — Ambulatory Visit (INDEPENDENT_AMBULATORY_CARE_PROVIDER_SITE_OTHER): Payer: BC Managed Care – PPO | Admitting: Cardiovascular Disease

## 2014-03-04 ENCOUNTER — Encounter: Payer: Self-pay | Admitting: Cardiovascular Disease

## 2014-03-04 VITALS — BP 150/80 | HR 75 | Ht 62.0 in | Wt 175.5 lb

## 2014-03-04 DIAGNOSIS — I10 Essential (primary) hypertension: Secondary | ICD-10-CM

## 2014-03-04 DIAGNOSIS — I471 Supraventricular tachycardia: Secondary | ICD-10-CM

## 2014-03-04 DIAGNOSIS — I2510 Atherosclerotic heart disease of native coronary artery without angina pectoris: Secondary | ICD-10-CM

## 2014-03-04 DIAGNOSIS — E785 Hyperlipidemia, unspecified: Secondary | ICD-10-CM

## 2014-03-04 NOTE — Assessment & Plan Note (Signed)
Continue treatment with lovastatin with a target LDL of less than 100.   

## 2014-03-04 NOTE — Progress Notes (Signed)
Primary care physician: Dr. Allyson Sabal   HPI  This is a 68 year old female who is here for a follow up regarding paroxysmal supraventricular tachycardia. The patient has known history of paroxysmal supraventricular tachycardia which was diagnosed in 2011-2012 . Since that time she had 3 episodes that required emergency room visits and termination with adenosine. She has been evaluated at [facility] in the past and was treated with metoprolol. She had a cardiac catheterization done in 2011 which showed a 50% LAD stenosis without any other obstructive disease. She was evaluated with a nuclear stress test in October of 2013 due to atypical chest pain. The stress test was normal.  She had no recurrent episodes of SVT since she was started on diltiazem. She is also on Toprol. She denies chest pain, shortness of breath or palpitations.   Allergies  Allergen Reactions  . Sulfa Antibiotics      Current Outpatient Prescriptions on File Prior to Visit  Medication Sig Dispense Refill  . aspirin 81 MG tablet Take 81 mg by mouth daily.      . Chlorpheniramine Maleate (ALLERGY PO) Take by mouth daily.      Marland Kitchen diltiazem (CARDIZEM) 30 MG tablet Take 30 mg by mouth 4 (four) times daily as needed.      Marland Kitchen estradiol (ESTRACE) 0.5 MG tablet Take 0.5 mg by mouth daily.      . fluocinonide cream (LIDEX) 6.60 % Apply 1 application topically as needed.       . lovastatin (MEVACOR) 40 MG tablet Take 40 mg by mouth at bedtime.      . metoprolol succinate (TOPROL-XL) 100 MG 24 hr tablet TAKE ONE TABLET BY MOUTH ONCE DAILY WITH OR IMMEDIATELY FOLLOWING A MEAL  30 tablet  6  . Misc Natural Products (GINKOGIN PO) Take by mouth daily.      . Multiple Vitamin (MULTIVITAMIN) tablet Take 1 tablet by mouth daily.      . VENTOLIN HFA 108 (90 BASE) MCG/ACT inhaler Inhale 2 puffs into the lungs every 4 (four) hours as needed.        No current facility-administered medications on file prior to visit.     Past Medical History    Diagnosis Date  . Tachycardia 2010  . PSVT (paroxysmal supraventricular tachycardia)   . Coronary artery disease 2011    Cardiac cath in 2011 at [facility]: 50% mid LAD stenosis without evidence of obstructive disease. Normal ejection fraction. Nuclear stress test done in October of 2013 4 atypical chest pain was normal.  . Hyperlipidemia   . Hypertension      Past Surgical History  Procedure Laterality Date  . Partial hysterectomy  1999  . Tonsillectomy    . Cardiac catheterization  2011    50% to the mid LAD lesion     Family History  Problem Relation Age of Onset  . Hypertension Mother   . Heart attack Father 38    MI  . Heart disease Father   . Hypertension Father   . Hypertension Sister   . Hyperlipidemia Sister   . Heart attack Brother     cardiac arrest  . Hypertension Sister   . Hyperlipidemia Sister      History   Social History  . Marital Status: Married    Spouse Name: N/A    Number of Children: N/A  . Years of Education: N/A   Occupational History  . Not on file.   Social History Main Topics  . Smoking status: Never Smoker   .  Smokeless tobacco: Not on file  . Alcohol Use: No  . Drug Use: No  . Sexual Activity: Not on file   Other Topics Concern  . Not on file   Social History Narrative  . No narrative on file        PHYSICAL EXAM   BP 150/80  Pulse 75  Ht 5\' 2"  (1.575 m)  Wt 175 lb 8 oz (79.606 kg)  BMI 32.09 kg/m2 Constitutional: She is oriented to person, place, and time. She appears well-developed and well-nourished. No distress.  HENT: No nasal discharge.  Head: Normocephalic and atraumatic.  Eyes: Pupils are equal and round. Right eye exhibits no discharge. Left eye exhibits no discharge.  Neck: Normal range of motion. Neck supple. No JVD present. No thyromegaly present.  Cardiovascular: Normal rate, regular rhythm, normal heart sounds. Exam reveals no gallop and no friction rub. No murmur heard.  Pulmonary/Chest: Effort  normal and breath sounds normal. No stridor. No respiratory distress. She has no wheezes. She has no rales. She exhibits no tenderness.  Abdominal: Soft. Bowel sounds are normal. She exhibits no distension. There is no tenderness. There is no rebound and no guarding.  Musculoskeletal: Normal range of motion. She exhibits trace  edema and no tenderness.  Neurological: She is alert and oriented to person, place, and time. Coordination normal.  Skin: Skin is warm and dry. No rash noted. She is not diaphoretic. No erythema. No pallor.  Psychiatric: She has a normal mood and affect. Her behavior is normal. Judgment and thought content normal.     EKG: Sinus  Rhythm  Low voltage in precordial leads.   -  Nonspecific T-abnormality.   ABNORMAL    ASSESSMENT AND PLAN

## 2014-03-04 NOTE — Patient Instructions (Signed)
Continue same medications.   Your physician wants you to follow-up in: 1 year.  You will receive a reminder letter in the mail two months in advance. If you don't receive a letter, please call our office to schedule the follow-up appointment.  Your next appointment will be scheduled in our new office located at :  [place]  [address] [place]  [place], [place] [address]

## 2014-03-04 NOTE — Assessment & Plan Note (Signed)
She had no recurrent episodes on diltiazem and metoprolol. No changes were made.

## 2014-03-04 NOTE — Assessment & Plan Note (Signed)
Blood pressure is elevated. Continue to monitor. Consider adding an ACE inhibitor or ARB if blood pressure remains elevated.

## 2014-03-04 NOTE — Assessment & Plan Note (Signed)
She had moderate one-vessel coronary artery disease. No symptoms of angina. Continue medical therapy.

## 2014-06-09 ENCOUNTER — Encounter: Payer: Self-pay | Admitting: Cardiovascular Disease

## 2014-06-09 ENCOUNTER — Ambulatory Visit (INDEPENDENT_AMBULATORY_CARE_PROVIDER_SITE_OTHER): Payer: Medicare Other | Admitting: Cardiovascular Disease

## 2014-06-09 VITALS — BP 130/78 | HR 68 | Ht 62.0 in | Wt 174.0 lb

## 2014-06-09 DIAGNOSIS — E785 Hyperlipidemia, unspecified: Secondary | ICD-10-CM

## 2014-06-09 DIAGNOSIS — I471 Supraventricular tachycardia, unspecified: Secondary | ICD-10-CM

## 2014-06-09 DIAGNOSIS — M549 Dorsalgia, unspecified: Secondary | ICD-10-CM

## 2014-06-09 DIAGNOSIS — R0789 Other chest pain: Secondary | ICD-10-CM

## 2014-06-09 DIAGNOSIS — I10 Essential (primary) hypertension: Secondary | ICD-10-CM

## 2014-06-09 DIAGNOSIS — I2510 Atherosclerotic heart disease of native coronary artery without angina pectoris: Secondary | ICD-10-CM

## 2014-06-09 NOTE — Progress Notes (Signed)
Primary care physician: Dr. Allyson Sabal   HPI  This is a 69 year old female who is here for a follow up regarding paroxysmal supraventricular tachycardia and nonobstructive coronary artery disease. The patient has known history of paroxysmal supraventricular tachycardia which was diagnosed in 2011-2012 . Since that time she had 3 episodes that required emergency room visits and termination with adenosine. She has been evaluated at [facility] in the past and was treated with metoprolol. She had a cardiac catheterization done in 2011 which showed a 50% LAD stenosis without any other obstructive disease. She was evaluated with a nuclear stress test in October of 2013 due to atypical chest pain. The stress test was normal.  She had no recurrent episodes of SVT since she was started on diltiazem. She is also on Toprol.  He had recent episodes of back discomfort between her shoulder blades both at rest and with activities. No chest discomfort. No shortness of breath.   Allergies  Allergen Reactions  . Sulfa Antibiotics      Current Outpatient Prescriptions on File Prior to Visit  Medication Sig Dispense Refill  . aspirin 81 MG tablet Take 81 mg by mouth daily.    . Chlorpheniramine Maleate (ALLERGY PO) Take by mouth daily.    Marland Kitchen diltiazem (CARDIZEM CD) 240 MG 24 hr capsule Take 240 mg by mouth daily.    Marland Kitchen diltiazem (CARDIZEM) 30 MG tablet Take 30 mg by mouth 4 (four) times daily as needed.    Marland Kitchen estradiol (ESTRACE) 0.5 MG tablet Take 0.5 mg by mouth daily.    . fluocinonide cream (LIDEX) 2.42 % Apply 1 application topically as needed.     . lovastatin (MEVACOR) 40 MG tablet Take 40 mg by mouth at bedtime.    . metoprolol succinate (TOPROL-XL) 100 MG 24 hr tablet TAKE ONE TABLET BY MOUTH ONCE DAILY WITH OR IMMEDIATELY FOLLOWING A MEAL 30 tablet 6  . Misc Natural Products (GINKOGIN PO) Take by mouth daily.    . Multiple Vitamin (MULTIVITAMIN) tablet Take 1 tablet by mouth daily.    Marland Kitchen oxybutynin (DITROPAN) 5 MG  tablet Take 5 mg by mouth daily.    . VENTOLIN HFA 108 (90 BASE) MCG/ACT inhaler Inhale 2 puffs into the lungs every 4 (four) hours as needed.      No current facility-administered medications on file prior to visit.     Past Medical History  Diagnosis Date  . Tachycardia 2010  . PSVT (paroxysmal supraventricular tachycardia)   . Coronary artery disease 2011    Cardiac cath in 2011 at [facility]: 50% mid LAD stenosis without evidence of obstructive disease. Normal ejection fraction. Nuclear stress test done in October of 2013 4 atypical chest pain was normal.  . Hyperlipidemia   . Hypertension      Past Surgical History  Procedure Laterality Date  . Partial hysterectomy  1999  . Tonsillectomy    . Cardiac catheterization  2011    50% to the mid LAD lesion     Family History  Problem Relation Age of Onset  . Hypertension Mother   . Heart attack Father 10    MI  . Heart disease Father   . Hypertension Father   . Hypertension Sister   . Hyperlipidemia Sister   . Heart attack Brother     cardiac arrest  . Hypertension Sister   . Hyperlipidemia Sister      History   Social History  . Marital Status: Married    Spouse Name: N/A  Number of Children: N/A  . Years of Education: N/A   Occupational History  . Not on file.   Social History Main Topics  . Smoking status: Never Smoker   . Smokeless tobacco: Not on file  . Alcohol Use: No  . Drug Use: No  . Sexual Activity: Not on file   Other Topics Concern  . Not on file   Social History Narrative        PHYSICAL EXAM   BP 130/78 mmHg  Pulse 68  Ht 5\' 2"  (1.575 m)  Wt 174 lb (78.926 kg)  BMI 31.82 kg/m2 Constitutional: She is oriented to person, place, and time. She appears well-developed and well-nourished. No distress.  HENT: No nasal discharge.  Head: Normocephalic and atraumatic.  Eyes: Pupils are equal and round. Right eye exhibits no discharge. Left eye exhibits no discharge.  Neck: Normal range  of motion. Neck supple. No JVD present. No thyromegaly present.  Cardiovascular: Normal rate, regular rhythm, normal heart sounds. Exam reveals no gallop and no friction rub. No murmur heard.  Pulmonary/Chest: Effort normal and breath sounds normal. No stridor. No respiratory distress. She has no wheezes. She has no rales. She exhibits no tenderness.  Abdominal: Soft. Bowel sounds are normal. She exhibits no distension. There is no tenderness. There is no rebound and no guarding.  Musculoskeletal: Normal range of motion. She exhibits trace  edema and no tenderness.  Neurological: She is alert and oriented to person, place, and time. Coordination normal.  Skin: Skin is warm and dry. No rash noted. She is not diaphoretic. No erythema. No pallor.  Psychiatric: She has a normal mood and affect. Her behavior is normal. Judgment and thought content normal.     EKG: Sinus  Rhythm  Low voltage in precordial leads.   -Poor R-wave progression -may be secondary to pulmonary disease   consider old anterior infarct.   -  Nonspecific T-abnormality.   ABNORMAL    ASSESSMENT AND PLAN

## 2014-06-09 NOTE — Patient Instructions (Addendum)
[place]  Your caregiver has ordered a Stress Test with nuclear imaging. The purpose of this test is to evaluate the blood supply to your heart muscle. This procedure is referred to as a "Non-Invasive Stress Test." This is because other than having an IV started in your vein, nothing is inserted or "invades" your body. Cardiac stress tests are done to find areas of poor blood flow to the heart by determining the extent of coronary artery disease (CAD). Some patients exercise on a treadmill, which naturally increases the blood flow to your heart, while others who are  unable to walk on a treadmill due to physical limitations have a pharmacologic/chemical stress agent called Lexiscan . This medicine will mimic walking on a treadmill by temporarily increasing your coronary blood flow.   Please note: these test may take anywhere between 2-4 hours to complete  PLEASE REPORT TO [place] AT THE FIRST DESK WILL DIRECT YOU WHERE TO GO  Date of Procedure:___________1/29/16__________________________  Arrival Time for Procedure:______0715 am________________________  Instructions regarding medication:    __x__:  Hold Metoprolol and Diltiazem the night before and the morning of. Please bring these medications with you to take afterwards.   PLEASE NOTIFY THE OFFICE AT LEAST 60 HOURS IN ADVANCE IF YOU ARE UNABLE TO KEEP YOUR APPOINTMENT.  [phone] AND  PLEASE NOTIFY NUCLEAR MEDICINE AT [facility] AT LEAST 24 HOURS IN ADVANCE IF YOU ARE UNABLE TO KEEP YOUR APPOINTMENT. [phone]  How to prepare for your Myoview test:  1. Do not eat or drink after midnight 2. No caffeine for 24 hours prior to test 3. No smoking 24 hours prior to test. 4. Your medication may be taken with water.  If your doctor stopped a medication because of this test, do not take that medication. 5. Ladies, please do not wear dresses.  Skirts or pants are appropriate. Please wear a short sleeve  shirt. 6. No perfume, cologne or lotion. 7. Wear comfortable walking shoes. No heels!  Your physician wants you to follow-up in: 6 months. You will receive a reminder letter in the mail two months in advance. If you don't receive a letter, please call our office to schedule the follow-up appointment.

## 2014-06-10 NOTE — Assessment & Plan Note (Signed)
Blood pressure is well controlled on current medications. 

## 2014-06-10 NOTE — Assessment & Plan Note (Signed)
She has not had any recurrent arrhythmia on metoprolol and diltiazem.

## 2014-06-10 NOTE — Assessment & Plan Note (Signed)
The patient had recent episodes of back discomfort which could be angina equivalent but the symptoms are overall atypical. I recommend evaluation with a treadmill nuclear stress test. EKG is slightly abnormal with poor R-wave progression in the precordial leads and nonspecific T wave changes.

## 2014-06-10 NOTE — Assessment & Plan Note (Signed)
Continue Treatment with Lovastatin with a target LDL of less than 100 and preferably less than 70.

## 2014-06-13 ENCOUNTER — Other Ambulatory Visit: Payer: Self-pay

## 2014-06-13 ENCOUNTER — Ambulatory Visit: Payer: Self-pay | Admitting: Cardiovascular Disease

## 2014-06-13 DIAGNOSIS — R079 Chest pain, unspecified: Secondary | ICD-10-CM

## 2014-06-13 DIAGNOSIS — R0789 Other chest pain: Secondary | ICD-10-CM

## 2014-07-04 ENCOUNTER — Emergency Department: Payer: Self-pay | Admitting: Emergency Medicine

## 2014-10-15 ENCOUNTER — Other Ambulatory Visit: Payer: Self-pay | Admitting: Unknown Physician Specialty

## 2014-10-15 DIAGNOSIS — Z1231 Encounter for screening mammogram for malignant neoplasm of breast: Secondary | ICD-10-CM

## 2014-10-21 ENCOUNTER — Other Ambulatory Visit: Payer: Self-pay | Admitting: Unknown Physician Specialty

## 2014-10-21 ENCOUNTER — Ambulatory Visit
Admission: RE | Admit: 2014-10-21 | Discharge: 2014-10-21 | Disposition: A | Discharge status: Home / Self Care | Payer: BLUE CROSS/BLUE SHIELD | Source: Ambulatory Visit | Attending: Unknown Physician Specialty | Admitting: Unknown Physician Specialty

## 2014-10-21 DIAGNOSIS — Z1231 Encounter for screening mammogram for malignant neoplasm of breast: Secondary | ICD-10-CM

## 2014-11-20 ENCOUNTER — Ambulatory Visit (INDEPENDENT_AMBULATORY_CARE_PROVIDER_SITE_OTHER): Payer: 59 | Admitting: Cardiovascular Disease

## 2014-11-20 ENCOUNTER — Encounter: Payer: Self-pay | Admitting: Cardiovascular Disease

## 2014-11-20 VITALS — BP 118/62 | HR 60 | Ht 62.0 in | Wt 170.5 lb

## 2014-11-20 DIAGNOSIS — I471 Supraventricular tachycardia: Secondary | ICD-10-CM

## 2014-11-20 DIAGNOSIS — E785 Hyperlipidemia, unspecified: Secondary | ICD-10-CM | POA: Diagnosis not present

## 2014-11-20 DIAGNOSIS — I2510 Atherosclerotic heart disease of native coronary artery without angina pectoris: Secondary | ICD-10-CM | POA: Diagnosis not present

## 2014-11-20 DIAGNOSIS — I10 Essential (primary) hypertension: Secondary | ICD-10-CM | POA: Diagnosis not present

## 2014-11-20 NOTE — Progress Notes (Signed)
Primary care physician: Dr. Allyson Sabal   HPI  This is a 69 year old female who is here for a follow up regarding paroxysmal supraventricular tachycardia and nonobstructive coronary artery disease. The patient has known history of paroxysmal supraventricular tachycardia which was diagnosed in 2011-2012 . Since that time she had 3 episodes that required emergency room visits and termination with adenosine. She has been evaluated at [facility] in the past and was treated with metoprolol. She had a cardiac catheterization done in 2011 which showed a 50% LAD stenosis without any other obstructive disease. She was evaluated with a nuclear stress test in 2013 and January 2016 due to atypical chest pain. The stress test was normal.  She had no recurrent episodes of SVT since she was started on diltiazem. She is also on Toprol.  She has been doing well overall with no recurrent palpitations.   Allergies  Allergen Reactions  . Sulfa Antibiotics      Current Outpatient Prescriptions on File Prior to Visit  Medication Sig Dispense Refill  . aspirin 81 MG tablet Take 81 mg by mouth daily.    . Chlorpheniramine Maleate (ALLERGY PO) Take by mouth daily.    . Cholecalciferol (VITAMIN D PO) Take by mouth daily.    Marland Kitchen diltiazem (CARDIZEM CD) 240 MG 24 hr capsule Take 240 mg by mouth daily.    Marland Kitchen diltiazem (CARDIZEM) 30 MG tablet Take 30 mg by mouth 4 (four) times daily as needed.    Marland Kitchen estradiol (ESTRACE) 0.5 MG tablet Take 0.5 mg by mouth daily.    . fluocinonide cream (LIDEX) 9.50 % Apply 1 application topically as needed.     . lovastatin (MEVACOR) 40 MG tablet Take 40 mg by mouth at bedtime.    . metoprolol succinate (TOPROL-XL) 100 MG 24 hr tablet TAKE ONE TABLET BY MOUTH ONCE DAILY WITH OR IMMEDIATELY FOLLOWING A MEAL 30 tablet 6  . Misc Natural Products (GINKOGIN PO) Take by mouth daily.    . Multiple Vitamin (MULTIVITAMIN) tablet Take 1 tablet by mouth daily.    Marland Kitchen oxybutynin (DITROPAN) 5 MG tablet Take 5 mg by  mouth daily.    . VENTOLIN HFA 108 (90 BASE) MCG/ACT inhaler Inhale 2 puffs into the lungs every 4 (four) hours as needed.     . vitamin C (ASCORBIC ACID) 500 MG tablet Take 500 mg by mouth daily.     No current facility-administered medications on file prior to visit.     Past Medical History  Diagnosis Date  . Tachycardia 2010  . PSVT (paroxysmal supraventricular tachycardia)   . Coronary artery disease 2011    Cardiac cath in 2011 at [facility]: 50% mid LAD stenosis without evidence of obstructive disease. Normal ejection fraction. Nuclear stress test done in October of 2013 4 atypical chest pain was normal.  . Hyperlipidemia   . Hypertension      Past Surgical History  Procedure Laterality Date  . Partial hysterectomy  1999  . Tonsillectomy    . Cardiac catheterization  2011    50% to the mid LAD lesion     Family History  Problem Relation Age of Onset  . Hypertension Mother   . Heart attack Father 87    MI  . Heart disease Father   . Hypertension Father   . Hypertension Sister   . Hyperlipidemia Sister   . Heart attack Brother     cardiac arrest  . Hypertension Sister   . Hyperlipidemia Sister      History  Social History  . Marital Status: Married    Spouse Name: N/A  . Number of Children: N/A  . Years of Education: N/A   Occupational History  . Not on file.   Social History Main Topics  . Smoking status: Never Smoker   . Smokeless tobacco: Not on file  . Alcohol Use: No  . Drug Use: No  . Sexual Activity: Not on file   Other Topics Concern  . Not on file   Social History Narrative        PHYSICAL EXAM   BP 118/62 mmHg  Pulse 60  Ht 5\' 2"  (1.575 m)  Wt 170 lb 8 oz (77.338 kg)  BMI 31.18 kg/m2 Constitutional: She is oriented to person, place, and time. She appears well-developed and well-nourished. No distress.  HENT: No nasal discharge.  Head: Normocephalic and atraumatic.  Eyes: Pupils are equal and round. Right eye exhibits no  discharge. Left eye exhibits no discharge.  Neck: Normal range of motion. Neck supple. No JVD present. No thyromegaly present.  Cardiovascular: Normal rate, regular rhythm, normal heart sounds. Exam reveals no gallop and no friction rub. No murmur heard.  Pulmonary/Chest: Effort normal and breath sounds normal. No stridor. No respiratory distress. She has no wheezes. She has no rales. She exhibits no tenderness.  Abdominal: Soft. Bowel sounds are normal. She exhibits no distension. There is no tenderness. There is no rebound and no guarding.  Musculoskeletal: Normal range of motion. She exhibits trace  edema and no tenderness.  Neurological: She is alert and oriented to person, place, and time. Coordination normal.  Skin: Skin is warm and dry. No rash noted. She is not diaphoretic. No erythema. No pallor.  Psychiatric: She has a normal mood and affect. Her behavior is normal. Judgment and thought content normal.     EKG: Sinus  Rhythm  -  Nonspecific T-abnormality.   ABNORMAL    ASSESSMENT AND PLAN

## 2014-11-20 NOTE — Assessment & Plan Note (Signed)
Continue low-dose aspirin and medical therapy for risk factors. Most recent nuclear stress test early this year showed no evidence of ischemia.

## 2014-11-20 NOTE — Assessment & Plan Note (Signed)
Blood pressure is controlled on current medications. 

## 2014-11-20 NOTE — Patient Instructions (Signed)
Medication Instructions: Continue same medications.   Labwork: None.   Procedures/Testing: None.   Follow-Up: 1 year with Dr. Arida  Any Additional Special Instructions Will Be Listed Below (If Applicable).   

## 2014-11-20 NOTE — Assessment & Plan Note (Signed)
Continue treatment with atorvastatin with a target LDL of less than 100. 

## 2014-11-20 NOTE — Assessment & Plan Note (Signed)
No recurrent episodes of tachycardia on metoprolol and diltiazem. Continue same medications and follow up on a yearly basis or earlier if needed.

## 2014-11-24 ENCOUNTER — Telehealth: Payer: Self-pay | Admitting: *Deleted

## 2014-11-24 MED ORDER — METOPROLOL TARTRATE 50 MG PO TABS: 50.0000 mg | ORAL_TABLET | Freq: Two times a day (BID) | ORAL | Status: DC

## 2014-11-24 NOTE — Telephone Encounter (Signed)
Switch to Metoprolol Tartrate 50 mg bid.

## 2014-11-24 NOTE — Telephone Encounter (Signed)
Do you want to switch this patient to metoprolol tart 50 mg twice a day or should she stay at Metoprolol Succ 50 mg twice a day? The patient is currently taking Metoprolol succ 100 mg one tablet daily.

## 2014-11-24 NOTE — Telephone Encounter (Signed)
  1. Which medications need to be refilled? Metroprolol 100mg  is $48.  Oxybutynin 5 mg $ 40.  2. Which pharmacy is medication to be sent to? [facility]   3. Do they need a 30 day or 90 day supply? 90 day supply  4. Would they like a call back once the medication has been sent to the pharmacy? Yes   Metroprolol 50mg  60tab only cost her $4.  Oxybutynin 100m 60 pills $4.

## 2014-11-24 NOTE — Telephone Encounter (Signed)
New Rx sent for metoprolol tart 50 mg take one tablet twice a day for 90 day supply.

## 2015-02-21 ENCOUNTER — Encounter: Payer: Self-pay | Admitting: *Deleted

## 2015-02-21 ENCOUNTER — Emergency Department
Admission: EM | Admit: 2015-02-21 | Discharge: 2015-02-21 | Disposition: A | Discharge status: Home / Self Care | Payer: Medicare Other | Attending: Emergency Medicine | Admitting: Emergency Medicine

## 2015-02-21 ENCOUNTER — Emergency Department: Payer: Medicare Other

## 2015-02-21 DIAGNOSIS — R079 Chest pain, unspecified: Secondary | ICD-10-CM

## 2015-02-21 DIAGNOSIS — Z79890 Hormone replacement therapy (postmenopausal): Secondary | ICD-10-CM | POA: Insufficient documentation

## 2015-02-21 DIAGNOSIS — I2510 Atherosclerotic heart disease of native coronary artery without angina pectoris: Secondary | ICD-10-CM | POA: Insufficient documentation

## 2015-02-21 DIAGNOSIS — Z7982 Long term (current) use of aspirin: Secondary | ICD-10-CM | POA: Insufficient documentation

## 2015-02-21 DIAGNOSIS — I10 Essential (primary) hypertension: Secondary | ICD-10-CM | POA: Diagnosis not present

## 2015-02-21 DIAGNOSIS — R0789 Other chest pain: Secondary | ICD-10-CM | POA: Insufficient documentation

## 2015-02-21 DIAGNOSIS — R0602 Shortness of breath: Secondary | ICD-10-CM | POA: Diagnosis not present

## 2015-02-21 DIAGNOSIS — Z79899 Other long term (current) drug therapy: Secondary | ICD-10-CM | POA: Insufficient documentation

## 2015-02-21 LAB — BASIC METABOLIC PANEL
Anion gap: 10 (ref 5–15)
BUN: 21 mg/dL — ABNORMAL HIGH (ref 6–20)
CHLORIDE: 103 mmol/L (ref 101–111)
CO2: 26 mmol/L (ref 22–32)
Calcium: 9.3 mg/dL (ref 8.9–10.3)
Creatinine, Ser: 0.92 mg/dL (ref 0.44–1.00)
GFR calc Af Amer: >60 mL/min (ref >60)
GFR calc non Af Amer: >60 mL/min (ref >60)
Glucose, Bld: 103 mg/dL — ABNORMAL HIGH (ref 65–99)
Potassium: 3.7 mmol/L (ref 3.5–5.1)
Sodium: 139 mmol/L (ref 135–145)

## 2015-02-21 LAB — CBC
HCT: 43.3 % (ref 35.0–47.0)
HEMOGLOBIN: 14.7 g/dL (ref 12.0–16.0)
MCH: 28.7 pg (ref 26.0–34.0)
MCHC: 33.9 g/dL (ref 32.0–36.0)
MCV: 84.8 fL (ref 80.0–100.0)
Platelets: 285 10*3/uL (ref 150–440)
RBC: 5.11 MIL/uL (ref 3.80–5.20)
RDW: 13 % (ref 11.5–14.5)
WBC: 7.1 10*3/uL (ref 3.6–11.0)

## 2015-02-21 LAB — TROPONIN I
Troponin I: <0.03 ng/mL (ref <0.031)
Troponin I: <0.03 ng/mL (ref <0.031)

## 2015-02-21 MED ORDER — IOHEXOL 350 MG/ML SOLN
100.0000 mL | Freq: Once | INTRAVENOUS | Status: AC | PRN
Start: 2015-02-21 — End: 2015-02-21
  Administered 2015-02-21: 100 mL via INTRAVENOUS

## 2015-02-21 NOTE — ED Notes (Signed)
PT reports onset of back pain around 11am, states now radiating into chest and neck. Also c/o shortness of breath and dizziness.

## 2015-02-21 NOTE — ED Provider Notes (Signed)
[facility] Emergency Department Provider Note REMINDER - THIS NOTE IS NOT A FINAL MEDICAL RECORD UNTIL IT IS SIGNED. UNTIL THEN, THE CONTENT BELOW MAY REFLECT INFORMATION FROM A DOCUMENTATION TEMPLATE, NOT THE ACTUAL PATIENT VISIT. ____________________________________________  Time seen: Approximately 5:17 PM  I have reviewed the triage vital signs and the nursing notes.   HISTORY  Chief Complaint Chest Pain    HPI Kimberly Trevino is a 69 y.o. female previous history of coronary disease, hypertension and high cholesterol. She presents today after noticing that she was having pain between her shoulder blades about 11 AM while at rest. It moved forward into her chest described as a uncomfortable pain that did radiate somewhat up into her neck and had some mild shortness of breath and a slight weakness feeling. She states her symptoms of all resolved as of about 2 hours ago after taking 4 baby aspirin at home.  No nausea or vomiting. No abdominal pain.   Past Medical History  Diagnosis Date  . Tachycardia 2010  . PSVT (paroxysmal supraventricular tachycardia) ([place])   . Coronary artery disease 2011    Cardiac cath in 2011 at [facility]: 50% mid LAD stenosis without evidence of obstructive disease. Normal ejection fraction. Nuclear stress test done in October of 2013 4 atypical chest pain was normal.  . Hyperlipidemia   . Hypertension     Patient Active Problem List   Diagnosis Date Noted  . Leg edema 12/04/2012  . PSVT (paroxysmal supraventricular tachycardia) ([place])   . Coronary artery disease   . Hyperlipidemia   . Hypertension     Past Surgical History  Procedure Laterality Date  . Partial hysterectomy  1999  . Tonsillectomy    . Cardiac catheterization  2011    50% to the mid LAD lesion    Current Outpatient Rx  Name  Route  Sig  Dispense  Refill  . aspirin 81 MG tablet   Oral   Take 81 mg by mouth daily.         . Chlorpheniramine Maleate  (ALLERGY PO)   Oral   Take by mouth daily.         . Cholecalciferol (VITAMIN D PO)   Oral   Take by mouth daily.         Marland Kitchen diltiazem (CARDIZEM CD) 240 MG 24 hr capsule   Oral   Take 240 mg by mouth daily.         Marland Kitchen diltiazem (CARDIZEM) 30 MG tablet   Oral   Take 30 mg by mouth 4 (four) times daily as needed.         Marland Kitchen estradiol (ESTRACE) 0.5 MG tablet   Oral   Take 0.5 mg by mouth daily.         . fluocinonide cream (LIDEX) 0.05 %   Topical   Apply 1 application topically as needed.          . lovastatin (MEVACOR) 40 MG tablet   Oral   Take 40 mg by mouth at bedtime.         . metoprolol (LOPRESSOR) 50 MG tablet   Oral   Take 1 tablet (50 mg total) by mouth 2 (two) times daily.   180 tablet   3   . Misc Natural Products (GINKOGIN PO)   Oral   Take by mouth daily.         . Multiple Vitamin (MULTIVITAMIN) tablet   Oral   Take 1 tablet by  mouth daily.         Marland Kitchen oxybutynin (DITROPAN) 5 MG tablet   Oral   Take 5 mg by mouth daily.         Cristino Martes Root 100 MG CAPS   Oral   Take 100 mg by mouth at bedtime.         . VENTOLIN HFA 108 (90 BASE) MCG/ACT inhaler   Inhalation   Inhale 2 puffs into the lungs every 4 (four) hours as needed.          . vitamin C (ASCORBIC ACID) 500 MG tablet   Oral   Take 500 mg by mouth daily.           Allergies Sulfa antibiotics  Family History  Problem Relation Age of Onset  . Hypertension Mother   . Heart attack Father 32    MI  . Heart disease Father   . Hypertension Father   . Hypertension Sister   . Hyperlipidemia Sister   . Heart attack Brother     cardiac arrest  . Hypertension Sister   . Hyperlipidemia Sister     Social History Social History  Substance Use Topics  . Smoking status: Never Smoker   . Smokeless tobacco: None  . Alcohol Use: No    Review of Systems Constitutional: No fever/chills Eyes: No visual changes. ENT: No sore throat. Cardiovascular: See history  of present illness Respiratory: See history of present illness Gastrointestinal: No abdominal pain.  No nausea, no vomiting.  No diarrhea.  No constipation. Genitourinary: Negative for dysuria. Musculoskeletal: Negative for back pain. Skin: Negative for rash. Neurological: Negative for headaches, focal weakness or numbness.  10-point ROS otherwise negative.  ____________________________________________   PHYSICAL EXAM:  VITAL SIGNS: ED Triage Vitals  Enc Vitals Group     BP 02/21/15 1302 192/78 mmHg     Pulse Rate 02/21/15 1302 61     Resp --      Temp 02/21/15 1302 97.9 F (36.6 C)     Temp Source 02/21/15 1302 Oral     SpO2 02/21/15 1302 98 %     Weight 02/21/15 1302 173 lb (78.472 kg)     Height 02/21/15 1302 5\' 2"  (1.575 m)     Head Cir --      Peak Flow --      Pain Score 02/21/15 1256 4     Pain Loc --      Pain Edu? --      Excl. in [place]? --    Constitutional: Alert and oriented. Well appearing and in no acute distress. She and her husband are both very pleasant. Eyes: Conjunctivae are normal. PERRL. EOMI. Head: Atraumatic. Nose: No congestion/rhinnorhea. Mouth/Throat: Mucous membranes are moist.  Oropharynx non-erythematous. Neck: No stridor.   Cardiovascular: Normal rate, regular rhythm. Grossly normal heart sounds.  Good peripheral circulation. Respiratory: Normal respiratory effort.  No retractions. Lungs CTAB. Gastrointestinal: Soft and nontender. No distention. No abdominal bruits. No CVA tenderness. Musculoskeletal: No lower extremity tenderness nor edema.  No joint effusions. Neurologic:  Normal speech and language. No gross focal neurologic deficits are appreciated. Skin:  Skin is warm, dry and intact. No rash noted. Psychiatric: Mood and affect are normal. Speech and behavior are normal.  ____________________________________________   LABS (all labs ordered are listed, but only abnormal results are displayed)  Labs Reviewed  BASIC METABOLIC PANEL -  Abnormal; Notable for the following:    Glucose, Bld 103 (*)    BUN  21 (*)    All other components within normal limits  CBC  TROPONIN I  TROPONIN I   ____________________________________________  EKG  ED ECG REPORT I, QUALE, MARK, the attending physician, personally viewed and interpreted this ECG.  Date: 02/21/2015 EKG Time: 1300 Rate: 60 Rhythm: normal sinus rhythm QRS Axis: normal Intervals: normal ST/T Wave abnormalities: normal Conduction Disutrbances: none Narrative Interpretation: Q wave noted in the inferior leads otherwise unremarkable  ____________________________________________  RADIOLOGY  CT ANGIO CHEST AORTA W/CM &/OR WO/CM (Final result) Result time: 02/21/15 18:31:20   Final result by Rad Results In Interface (02/21/15 18:31:20)   Narrative:   CLINICAL DATA: Acute onset of chest and back pain beginning 6 hours ago. Shortness of breath and dizziness.  EXAM: CT ANGIOGRAPHY CHEST WITH CONTRAST, with precontrast scanning for evaluation of the aorta.  TECHNIQUE: Multidetector CT imaging of the chest was performed using the standard protocol during bolus administration of intravenous contrast. Precontrast scanning was also performed for evaluation of the aorta. Multiplanar CT image reconstructions and MIPs were obtained to evaluate the vascular anatomy.  CONTRAST: 151mL OMNIPAQUE IOHEXOL 350 MG/ML SOLN  COMPARISON: Chest radiography same day  FINDINGS: Vascular opacification is excellent. There is no aortic aneurysm or dissection. The patient does have extensive coronary artery calcification, particularly in the left system. There are no visible pulmonary emboli.  The lungs are clear. No infiltrate, collapse or mass. No pleural or pericardial fluid.  Scans in the upper abdomen appear to show hydronephrosis of the right kidney. This was not completely evaluated.  No acute or significant bone finding. There are ordinary mild degenerative  changes of the thoracic spine.  Review of the MIP images confirms the above findings.  IMPRESSION: No evidence of aortic pathology or pulmonary emboli. The patient does have extensive coronary artery calcification in the left system.  Lungs are clear.  No acute bony finding.  Probable hydronephrosis of the right kidney. This was not fully evaluated.    ____________________________________________   PROCEDURES  Procedure(s) performed: None  Critical Care performed: No  ____________________________________________   INITIAL IMPRESSION / ASSESSMENT AND PLAN / ED COURSE  Pertinent labs & imaging results that were available during my care of the patient were reviewed by me and considered in my medical decision making (see chart for details).  Patient presents with a brief episode of chest pain lasted approximately one hour started in her back radiating to the chest and neck. Currently resolved with a reassuring twelve-lead and negative troponin initially. Patient does however have significant risk factors for coronary disease including a known 50% stenosis. She is moderate risk for coronary disease based on heart score.  In addition because of the back pain which radiated forward I will obtain CT angiography to evaluate and rule out dissection given initial blood pressure 190 on arrival.  ----------------------------------------- 7:08 PM on 02/21/2015 -----------------------------------------  Patient remains pain and symptom-free. She does have known 50% stenosis of the LAD. I discussed labs, clinical history, and EKGs with Dr. Harrington Challenger on call cardiology. She advises that at this point the patient would be safe for discharge, I agree given her asymptomatic status and 2 negative troponins. I discussed with the patient and she will follow up closely with Dr. Fletcher Anon.  Careful return precautions advised. Patient and family very  agreeable. ____________________________________________   FINAL CLINICAL IMPRESSION(S) / ED DIAGNOSES  Final diagnoses:  Moderate risk chest pain      Delman Kitten, MD 02/21/15 1910

## 2015-02-21 NOTE — Discharge Instructions (Signed)
You have been seen in the Emergency Department (ED) today for chest pain.  As we have discussed today’s test results are normal, but you may require further testing. ° °Please follow up with the recommended doctor as instructed above in these documents regarding today’s emergent visit and your recent symptoms to discuss further management.  Continue to take your regular medications. If you are not doing so already, please also take a daily baby aspirin (81 mg), at least until you follow up with your doctor. ° °Return to the Emergency Department (ED) if you experience any further chest pain/pressure/tightness, difficulty breathing, or sudden sweating, or other symptoms that concern you. ° ° °Chest Pain Observation °It is often hard to give a specific diagnosis for the cause of chest pain. Among other possibilities your symptoms might be caused by inadequate oxygen delivery to your heart (angina). Angina that is not treated or evaluated can lead to a heart attack (myocardial infarction) or death. °Blood tests, electrocardiograms, and X-rays may have been done to help determine a possible cause of your chest pain. After evaluation and observation, your health care provider has determined that it is unlikely your pain was caused by an unstable condition that requires hospitalization. However, a full evaluation of your pain may need to be completed, with additional diagnostic testing as directed. It is very important to keep your follow-up appointments. Not keeping your follow-up appointments could result in permanent heart damage, disability, or death. If there is any problem keeping your follow-up appointments, you must call your health care provider. °HOME CARE INSTRUCTIONS  °Due to the slight chance that your pain could be angina, it is important to follow your health care provider's treatment plan and also maintain a healthy lifestyle: °· Maintain or work toward achieving a healthy weight. °· Stay physically active  and exercise regularly. °· Decrease your salt intake. °· Eat a balanced, healthy diet. Talk to a dietitian to learn about heart-healthy foods. °· Increase your fiber intake by including whole grains, vegetables, fruits, and nuts in your diet. °· Avoid situations that cause stress, anger, or depression. °· Take medicines as advised by your health care provider. Report any side effects to your health care provider. Do not stop medicines or adjust the dosages on your own. °· Quit smoking. Do not use nicotine patches or gum until you check with your health care provider. °· Keep your blood pressure, blood sugar, and cholesterol levels within normal limits. °· Limit alcohol intake to no more than 1 drink per day for women who are not pregnant and 2 drinks per day for men. °· Do not abuse drugs. °SEEK IMMEDIATE MEDICAL CARE IF: °You have severe chest pain or pressure which may include symptoms such as: °· You feel pain or pressure in your arms, neck, jaw, or back. °· You have severe back or abdominal pain, feel sick to your stomach (nauseous), or throw up (vomit). °· You are sweating profusely. °· You are having a fast or irregular heartbeat. °· You feel short of breath while at rest. °· You notice increasing shortness of breath during rest, sleep, or with activity. °· You have chest pain that does not get better after rest or after taking your usual medicine. °· You wake from sleep with chest pain. °· You are unable to sleep because you cannot breathe. °· You develop a frequent cough or you are coughing up blood. °· You feel dizzy, faint, or experience extreme fatigue. °· You develop severe weakness, dizziness, fainting,   or chills. °Any of these symptoms may represent a serious problem that is an emergency. Do not wait to see if the symptoms will go away. Call your local emergency services (911 in the [place] Do not drive yourself to the hospital. °MAKE SURE YOU: °· Understand these instructions. °· Will watch your  condition. °· Will get help right away if you are not doing well or get worse. °  °This information is not intended to replace advice given to you by your health care provider. Make sure you discuss any questions you have with your health care provider. °  °Document Released: 06/04/2010 Document Revised: 05/07/2013 Document Reviewed: 11/01/2012 °Elsevier Interactive Patient Education ©2016 [employer] ° °

## 2015-02-23 ENCOUNTER — Telehealth: Payer: Self-pay | Admitting: *Deleted

## 2015-02-23 NOTE — Telephone Encounter (Signed)
Lmov to make hosptial fu with Dr Fletcher Anon

## 2015-02-23 NOTE — Telephone Encounter (Signed)
Patient called back and she doesn't want to schedule an apptointment at this time.

## 2015-04-21 ENCOUNTER — Other Ambulatory Visit: Payer: Self-pay | Admitting: Orthopedic Surgery

## 2015-04-21 DIAGNOSIS — M5412 Radiculopathy, cervical region: Secondary | ICD-10-CM

## 2015-04-21 DIAGNOSIS — M542 Cervicalgia: Secondary | ICD-10-CM

## 2015-05-13 ENCOUNTER — Ambulatory Visit
Admission: RE | Admit: 2015-05-13 | Discharge: 2015-05-13 | Disposition: A | Discharge status: Home / Self Care | Payer: Medicare Other | Source: Ambulatory Visit | Attending: Orthopedic Surgery | Admitting: Orthopedic Surgery

## 2015-05-13 DIAGNOSIS — M542 Cervicalgia: Secondary | ICD-10-CM

## 2015-05-13 DIAGNOSIS — Z981 Arthrodesis status: Secondary | ICD-10-CM | POA: Diagnosis not present

## 2015-05-13 DIAGNOSIS — M50221 Other cervical disc displacement at C4-C5 level: Secondary | ICD-10-CM | POA: Insufficient documentation

## 2015-05-13 DIAGNOSIS — M5412 Radiculopathy, cervical region: Secondary | ICD-10-CM | POA: Diagnosis not present

## 2015-05-13 DIAGNOSIS — M4802 Spinal stenosis, cervical region: Secondary | ICD-10-CM | POA: Diagnosis not present

## 2015-05-13 MED ORDER — GADOBENATE DIMEGLUMINE 529 MG/ML IV SOLN
20.0000 mL | Freq: Once | INTRAVENOUS | Status: AC | PRN
  Administered 2015-05-13: 16 mL via INTRAVENOUS

## 2015-05-17 DIAGNOSIS — I639 Cerebral infarction, unspecified: Secondary | ICD-10-CM

## 2015-05-17 HISTORY — DX: Cerebral infarction, unspecified: I63.9

## 2015-10-28 ENCOUNTER — Other Ambulatory Visit: Payer: Self-pay | Admitting: Family Medicine

## 2015-10-28 DIAGNOSIS — Z1231 Encounter for screening mammogram for malignant neoplasm of breast: Secondary | ICD-10-CM

## 2015-11-11 ENCOUNTER — Ambulatory Visit
Admission: RE | Admit: 2015-11-11 | Discharge: 2015-11-11 | Disposition: A | Discharge status: Home / Self Care | Payer: Medicare Other | Source: Ambulatory Visit | Attending: Family Medicine | Admitting: Family Medicine

## 2015-11-11 DIAGNOSIS — Z1231 Encounter for screening mammogram for malignant neoplasm of breast: Secondary | ICD-10-CM | POA: Diagnosis not present

## 2015-11-18 ENCOUNTER — Other Ambulatory Visit: Payer: Self-pay | Admitting: Family Medicine

## 2015-11-18 DIAGNOSIS — N63 Unspecified lump in unspecified breast: Secondary | ICD-10-CM

## 2015-11-27 ENCOUNTER — Ambulatory Visit
Admission: RE | Admit: 2015-11-27 | Discharge: 2015-11-27 | Disposition: A | Discharge status: Home / Self Care | Payer: Medicare Other | Source: Ambulatory Visit | Attending: Family Medicine | Admitting: Family Medicine

## 2015-11-27 DIAGNOSIS — N6002 Solitary cyst of left breast: Secondary | ICD-10-CM | POA: Diagnosis not present

## 2015-11-27 DIAGNOSIS — N63 Unspecified lump in unspecified breast: Secondary | ICD-10-CM

## 2016-02-29 ENCOUNTER — Other Ambulatory Visit: Payer: Self-pay | Admitting: Gastroenterology

## 2016-02-29 DIAGNOSIS — R1314 Dysphagia, pharyngoesophageal phase: Secondary | ICD-10-CM

## 2016-03-03 ENCOUNTER — Ambulatory Visit
Admission: RE | Admit: 2016-03-03 | Discharge: 2016-03-03 | Disposition: A | Discharge status: Home / Self Care | Payer: Medicare Other | Source: Ambulatory Visit | Attending: Gastroenterology | Admitting: Gastroenterology

## 2016-03-03 DIAGNOSIS — R1314 Dysphagia, pharyngoesophageal phase: Secondary | ICD-10-CM

## 2016-03-03 DIAGNOSIS — K219 Gastro-esophageal reflux disease without esophagitis: Secondary | ICD-10-CM | POA: Insufficient documentation

## 2016-03-03 DIAGNOSIS — R933 Abnormal findings on diagnostic imaging of other parts of digestive tract: Secondary | ICD-10-CM | POA: Insufficient documentation

## 2016-03-15 ENCOUNTER — Encounter: Payer: Self-pay | Admitting: Cardiovascular Disease

## 2016-03-15 ENCOUNTER — Ambulatory Visit (INDEPENDENT_AMBULATORY_CARE_PROVIDER_SITE_OTHER): Payer: Medicare Other | Admitting: Cardiovascular Disease

## 2016-03-15 VITALS — BP 118/60 | HR 65 | Ht 62.0 in | Wt 177.5 lb

## 2016-03-15 DIAGNOSIS — I471 Supraventricular tachycardia: Secondary | ICD-10-CM

## 2016-03-15 DIAGNOSIS — I2510 Atherosclerotic heart disease of native coronary artery without angina pectoris: Secondary | ICD-10-CM

## 2016-03-15 DIAGNOSIS — I10 Essential (primary) hypertension: Secondary | ICD-10-CM | POA: Diagnosis not present

## 2016-03-15 DIAGNOSIS — E782 Mixed hyperlipidemia: Secondary | ICD-10-CM

## 2016-03-15 NOTE — Patient Instructions (Signed)
Medication Instructions: Continue same medications.   Labwork: None.   Procedures/Testing: None.   Follow-Up: 1 year with Dr. Arida.   Any Additional Special Instructions Will Be Listed Below (If Applicable).     If you need a refill on your cardiac medications before your next appointment, please call your pharmacy.   

## 2016-03-15 NOTE — Progress Notes (Signed)
Cardiology Office Note   Date:  03/15/2016   ID:  Ciri, Estrela 07/12/1945, MRN [id number]  PCP:  Annice Needy, MD  Cardiologist:   [name], MD   Chief Complaint  Patient presents with  . other    12 month follow up. Meds reviewed by the pt. verbally. "doing well."       History of Present Illness: Kimberly Trevino is a 70 y.o. female who presents for a follow up regarding paroxysmal supraventricular tachycardia and nonobstructive coronary artery disease. The patient has known history of paroxysmal supraventricular tachycardia which was diagnosed in 2011-2012 . Since that time she had 3 episodes that required emergency room visits and termination with adenosine.  Previous cardiac catheterization done in 2011  showed a 50% LAD stenosis without any other obstructive disease. Nuclear stress test in January 2016 was normal.  She had no recurrent episodes of SVT since she was started on diltiazem. She is also on Toprol.  She has been doing well overall with no recurrent palpitations.   Past Medical History:  Diagnosis Date  . Coronary artery disease 2011   Cardiac cath in 2011 at [facility]: 50% mid LAD stenosis without evidence of obstructive disease. Normal ejection fraction. Nuclear stress test done in October of 2013 4 atypical chest pain was normal.  . Hyperlipidemia   . Hypertension   . PSVT (paroxysmal supraventricular tachycardia) ([place])   . Tachycardia 2010    Past Surgical History:  Procedure Laterality Date  . ABDOMINAL HYSTERECTOMY    . CARDIAC CATHETERIZATION  2011   50% to the mid LAD lesion  . PARTIAL HYSTERECTOMY  1999  . TONSILLECTOMY       Current Outpatient Prescriptions  Medication Sig Dispense Refill  . aspirin 81 MG tablet Take 81 mg by mouth daily.    . Chlorpheniramine Maleate (ALLERGY PO) Take by mouth daily.    . Cholecalciferol (VITAMIN D PO) Take by mouth daily.    Marland Kitchen diltiazem (CARDIZEM CD) 240 MG 24 hr capsule Take 240 mg by  mouth daily.    Marland Kitchen diltiazem (CARDIZEM) 30 MG tablet Take 30 mg by mouth 4 (four) times daily as needed.    Marland Kitchen estradiol (ESTRACE) 0.5 MG tablet Take 0.5 mg by mouth daily.    . fluocinonide cream (LIDEX) [id number] % Apply 1 application topically as needed.     Marland Kitchen lisinopril (PRINIVIL,ZESTRIL) 10 MG tablet Take 10 mg by mouth daily.    Marland Kitchen lovastatin (MEVACOR) 40 MG tablet Take 40 mg by mouth at bedtime.    . metoprolol (LOPRESSOR) 50 MG tablet Take 1 tablet (50 mg total) by mouth 2 (two) times daily. 180 tablet 3  . Misc Natural Products (GINKOGIN PO) Take by mouth daily.    . Multiple Vitamin (MULTIVITAMIN) tablet Take 1 tablet by mouth daily.    Marland Kitchen oxybutynin (DITROPAN) 5 MG tablet Take 5 mg by mouth daily.    Cristino Martes Root 100 MG CAPS Take 100 mg by mouth at bedtime.    . VENTOLIN HFA 108 (90 BASE) MCG/ACT inhaler Inhale 2 puffs into the lungs every 4 (four) hours as needed.     . vitamin C (ASCORBIC ACID) 500 MG tablet Take 500 mg by mouth daily.     No current facility-administered medications for this visit.     Allergies:   Sulfa antibiotics    Social History:  The patient  reports that she has never smoked. She has never used smokeless tobacco.  She reports that she does not drink alcohol or use drugs.   Family History:  The patient's family history includes Heart attack in her brother; Heart attack (age of onset: 6) in her father; Heart disease in her father; Hyperlipidemia in her sister and sister; Hypertension in her father, mother, sister, and sister.    ROS:  Please see the history of present illness.   Otherwise, review of systems are positive for none.   All other systems are reviewed and negative.    PHYSICAL EXAM: VS:  BP 118/60 (BP Location: Left Arm, Patient Position: Sitting, Cuff Size: Normal)   Pulse 65   Ht 5\' 2"  (1.575 m)   Wt 177 lb 8 oz (80.5 kg)   BMI 32.47 kg/m  , BMI Body mass index is 32.47 kg/m. GEN: Well nourished, well developed, in no acute distress    HEENT: normal  Neck: no JVD, carotid bruits, or masses Cardiac: RRR; no  rubs, or gallops, trace edema . One out of 6 systolic ejection murmur in the aortic area Respiratory:  clear to auscultation bilaterally, normal work of breathing GI: soft, nontender, nondistended, + BS MS: no deformity or atrophy  Skin: warm and dry, no rash Neuro:  Strength and sensation are intact Psych: euthymic mood, full affect   EKG:  EKG is ordered today. The ekg ordered today demonstrates normal sinus rhythm with no significant ST or T wave changes.   Recent Labs: No results found for requested labs within last 8760 hours.    Lipid Panel No results found for: CHOL, TRIG, HDL, CHOLHDL, VLDL, LDLCALC, LDLDIRECT    Wt Readings from Last 3 Encounters:  03/15/16 177 lb 8 oz (80.5 kg)  02/21/15 173 lb (78.5 kg)  11/20/14 170 lb 8 oz (77.3 kg)      No flowsheet data found.    ASSESSMENT AND PLAN:  1.   Paroxysmal supraventricular tachycardia: No recurrent episodes on diltiazem and metoprolol. Continue same treatment.  2. Essential hypertension: Blood pressure is well controlled on current medications.  3. Coronary artery disease involving native coronary arteries without angina: The new medical therapy.  4. Hyperlipidemia: Into new treatment with lovastatin . Most recent lipid profile in September showed a triglyceride of 191 and an LDL of 111. Consider treatment with a more potent statin to achieve an LDL less than 70 given the presence of coronary artery disease on previous cardiac catheterization.    Disposition:   FU with me in 1 year  Signed,  [name], MD  03/15/2016 2:38 PM    [place]

## 2016-04-04 ENCOUNTER — Encounter: Admission: RE | Payer: Self-pay | Source: Ambulatory Visit

## 2016-04-04 ENCOUNTER — Ambulatory Visit: Admission: RE | Admit: 2016-04-04 | Payer: Medicare Other | Source: Ambulatory Visit | Admitting: Gastroenterology

## 2016-04-04 SURGERY — ESOPHAGOGASTRODUODENOSCOPY (EGD) WITH PROPOFOL
Anesthesia: General

## 2016-04-10 ENCOUNTER — Emergency Department: Payer: Medicare Other

## 2016-04-10 ENCOUNTER — Observation Stay: Payer: Medicare Other

## 2016-04-10 ENCOUNTER — Encounter: Payer: Self-pay | Admitting: Emergency Medicine

## 2016-04-10 ENCOUNTER — Inpatient Hospital Stay
Admission: EM | Admit: 2016-04-10 | Discharge: 2016-04-12 | DRG: 065 | Disposition: A | Discharge status: Home / Self Care | Payer: Medicare Other | Attending: Internal Medicine | Admitting: Internal Medicine

## 2016-04-10 DIAGNOSIS — Z7982 Long term (current) use of aspirin: Secondary | ICD-10-CM

## 2016-04-10 DIAGNOSIS — R29898 Other symptoms and signs involving the musculoskeletal system: Secondary | ICD-10-CM

## 2016-04-10 DIAGNOSIS — I471 Supraventricular tachycardia: Secondary | ICD-10-CM | POA: Diagnosis present

## 2016-04-10 DIAGNOSIS — I10 Essential (primary) hypertension: Secondary | ICD-10-CM | POA: Diagnosis present

## 2016-04-10 DIAGNOSIS — R531 Weakness: Secondary | ICD-10-CM | POA: Diagnosis not present

## 2016-04-10 DIAGNOSIS — Z8249 Family history of ischemic heart disease and other diseases of the circulatory system: Secondary | ICD-10-CM

## 2016-04-10 DIAGNOSIS — I6340 Cerebral infarction due to embolism of unspecified cerebral artery: Secondary | ICD-10-CM | POA: Diagnosis not present

## 2016-04-10 DIAGNOSIS — R402254 Coma scale, best verbal response, oriented, 24 hours or more after hospital admission: Secondary | ICD-10-CM | POA: Diagnosis present

## 2016-04-10 DIAGNOSIS — N3281 Overactive bladder: Secondary | ICD-10-CM | POA: Diagnosis present

## 2016-04-10 DIAGNOSIS — I639 Cerebral infarction, unspecified: Secondary | ICD-10-CM | POA: Diagnosis present

## 2016-04-10 DIAGNOSIS — G8321 Monoplegia of upper limb affecting right dominant side: Secondary | ICD-10-CM | POA: Diagnosis present

## 2016-04-10 DIAGNOSIS — G459 Transient cerebral ischemic attack, unspecified: Secondary | ICD-10-CM

## 2016-04-10 DIAGNOSIS — Z882 Allergy status to sulfonamides status: Secondary | ICD-10-CM

## 2016-04-10 DIAGNOSIS — E785 Hyperlipidemia, unspecified: Secondary | ICD-10-CM | POA: Diagnosis present

## 2016-04-10 DIAGNOSIS — I2510 Atherosclerotic heart disease of native coronary artery without angina pectoris: Secondary | ICD-10-CM | POA: Diagnosis present

## 2016-04-10 DIAGNOSIS — R402364 Coma scale, best motor response, obeys commands, 24 hours or more after hospital admission: Secondary | ICD-10-CM | POA: Diagnosis present

## 2016-04-10 DIAGNOSIS — R29701 NIHSS score 1: Secondary | ICD-10-CM | POA: Diagnosis present

## 2016-04-10 DIAGNOSIS — Z79890 Hormone replacement therapy (postmenopausal): Secondary | ICD-10-CM

## 2016-04-10 DIAGNOSIS — R2681 Unsteadiness on feet: Secondary | ICD-10-CM

## 2016-04-10 DIAGNOSIS — R402144 Coma scale, eyes open, spontaneous, 24 hours or more after hospital admission: Secondary | ICD-10-CM | POA: Diagnosis present

## 2016-04-10 DIAGNOSIS — Z8241 Family history of sudden cardiac death: Secondary | ICD-10-CM

## 2016-04-10 LAB — URINALYSIS COMPLETE WITH MICROSCOPIC (ARMC ONLY)
Bilirubin Urine: NEGATIVE
Glucose, UA: NEGATIVE mg/dL
Hgb urine dipstick: NEGATIVE
Ketones, ur: NEGATIVE mg/dL
Leukocytes, UA: NEGATIVE
Nitrite: NEGATIVE
PROTEIN: NEGATIVE mg/dL
RBC / HPF: NONE SEEN RBC/hpf (ref 0–5)
Specific Gravity, Urine: 1.003 — ABNORMAL LOW (ref 1.005–1.030)
pH: 7 (ref 5.0–8.0)

## 2016-04-10 LAB — CBC
HEMATOCRIT: 41.4 % (ref 35.0–47.0)
Hemoglobin: 14.8 g/dL (ref 12.0–16.0)
MCH: 29.9 pg (ref 26.0–34.0)
MCHC: 35.7 g/dL (ref 32.0–36.0)
MCV: 83.7 fL (ref 80.0–100.0)
Platelets: 278 10*3/uL (ref 150–440)
RBC: 4.94 MIL/uL (ref 3.80–5.20)
RDW: 13.7 % (ref 11.5–14.5)
WBC: 3.6 10*3/uL (ref 3.6–11.0)

## 2016-04-10 LAB — DIFFERENTIAL
BASOS ABS: 0 10*3/uL (ref 0–0.1)
BASOS PCT: 1 %
Eosinophils Absolute: 0.2 10*3/uL (ref 0–0.7)
Eosinophils Relative: 7 %
Lymphocytes Relative: 32 %
Lymphs Abs: 1.2 10*3/uL (ref 1.0–3.6)
MONOS PCT: 10 %
Monocytes Absolute: 0.4 10*3/uL (ref 0.2–0.9)
NEUTROS ABS: 1.8 10*3/uL (ref 1.4–6.5)
Neutrophils Relative %: 50 %

## 2016-04-10 LAB — COMPREHENSIVE METABOLIC PANEL
ALT: 25 U/L (ref 14–54)
AST: 31 U/L (ref 15–41)
Albumin: 4.4 g/dL (ref 3.5–5.0)
Alkaline Phosphatase: 64 U/L (ref 38–126)
Anion gap: 7 (ref 5–15)
BUN: 15 mg/dL (ref 6–20)
CHLORIDE: 109 mmol/L (ref 101–111)
CO2: 24 mmol/L (ref 22–32)
CREATININE: 0.78 mg/dL (ref 0.44–1.00)
Calcium: 9.5 mg/dL (ref 8.9–10.3)
GFR calc Af Amer: >60 mL/min (ref >60)
Glucose, Bld: 144 mg/dL — ABNORMAL HIGH (ref 65–99)
Potassium: 3.6 mmol/L (ref 3.5–5.1)
SODIUM: 140 mmol/L (ref 135–145)
Total Bilirubin: 0.7 mg/dL (ref 0.3–1.2)
Total Protein: 7.5 g/dL (ref 6.5–8.1)

## 2016-04-10 LAB — PROTIME-INR
INR: 0.92
Prothrombin Time: 12.3 seconds (ref 11.4–15.2)

## 2016-04-10 LAB — TROPONIN I: Troponin I: <0.03 ng/mL (ref <0.03)

## 2016-04-10 LAB — MAGNESIUM: MAGNESIUM: 2 mg/dL (ref 1.7–2.4)

## 2016-04-10 LAB — APTT: APTT: 27 s (ref 24–36)

## 2016-04-10 LAB — ETHANOL

## 2016-04-10 MED ORDER — SODIUM CHLORIDE 0.9 % IV BOLUS (SEPSIS)
500.0000 mL | Freq: Once | INTRAVENOUS | Status: AC
  Administered 2016-04-10: 500 mL via INTRAVENOUS

## 2016-04-10 MED ORDER — ASPIRIN EC 81 MG PO TBEC
81.0000 mg | DELAYED_RELEASE_TABLET | Freq: Every day | ORAL | Status: DC
  Administered 2016-04-10 – 2016-04-11 (×2): 81 mg via ORAL
  Filled 2016-04-10 (×2): qty 1

## 2016-04-10 MED ORDER — ASPIRIN 81 MG PO CHEW
324.0000 mg | CHEWABLE_TABLET | Freq: Once | ORAL | Status: AC
  Administered 2016-04-10: 324 mg via ORAL
  Filled 2016-04-10: qty 4

## 2016-04-10 MED ORDER — ALBUTEROL SULFATE HFA 108 (90 BASE) MCG/ACT IN AERS: 2.0000 | INHALATION_SPRAY | RESPIRATORY_TRACT | Status: DC | PRN

## 2016-04-10 MED ORDER — ALBUTEROL SULFATE (2.5 MG/3ML) 0.083% IN NEBU: 2.5000 mg | INHALATION_SOLUTION | RESPIRATORY_TRACT | Status: DC | PRN

## 2016-04-10 MED ORDER — ADULT MULTIVITAMIN W/MINERALS CH
1.0000 | ORAL_TABLET | Freq: Every day | ORAL | Status: DC
  Administered 2016-04-10 – 2016-04-12 (×3): 1 via ORAL
  Filled 2016-04-10 (×4): qty 1

## 2016-04-10 MED ORDER — DILTIAZEM HCL ER COATED BEADS 120 MG PO CP24
240.0000 mg | ORAL_CAPSULE | Freq: Every day | ORAL | Status: DC
  Administered 2016-04-10 – 2016-04-12 (×3): 240 mg via ORAL
  Filled 2016-04-10 (×3): qty 2

## 2016-04-10 MED ORDER — ENOXAPARIN SODIUM 40 MG/0.4ML ~~LOC~~ SOLN
40.0000 mg | SUBCUTANEOUS | Status: DC
  Administered 2016-04-10 – 2016-04-11 (×2): 40 mg via SUBCUTANEOUS
  Filled 2016-04-10 (×2): qty 0.4

## 2016-04-10 MED ORDER — OXYBUTYNIN CHLORIDE 5 MG PO TABS
5.0000 mg | ORAL_TABLET | Freq: Every day | ORAL | Status: DC
  Administered 2016-04-10 – 2016-04-12 (×3): 5 mg via ORAL
  Filled 2016-04-10 (×4): qty 1

## 2016-04-10 MED ORDER — STROKE: EARLY STAGES OF RECOVERY BOOK
Freq: Once | Status: AC
  Administered 2016-04-10: 11:00:00

## 2016-04-10 MED ORDER — LISINOPRIL 20 MG PO TABS
20.0000 mg | ORAL_TABLET | Freq: Every day | ORAL | Status: DC
  Administered 2016-04-10 – 2016-04-12 (×3): 20 mg via ORAL
  Filled 2016-04-10 (×3): qty 1

## 2016-04-10 MED ORDER — ESTRADIOL 1 MG PO TABS
0.5000 mg | ORAL_TABLET | Freq: Every day | ORAL | Status: DC
Start: 2016-04-10 — End: 2016-04-12
  Administered 2016-04-10 – 2016-04-12 (×3): 0.5 mg via ORAL
  Filled 2016-04-10 (×3): qty 1

## 2016-04-10 MED ORDER — SENNOSIDES-DOCUSATE SODIUM 8.6-50 MG PO TABS: 1.0000 | ORAL_TABLET | Freq: Every evening | ORAL | Status: DC | PRN

## 2016-04-10 MED ORDER — METOPROLOL SUCCINATE ER 50 MG PO TB24
100.0000 mg | ORAL_TABLET | Freq: Every day | ORAL | Status: DC
  Administered 2016-04-10 – 2016-04-12 (×3): 100 mg via ORAL
  Filled 2016-04-10 (×3): qty 2

## 2016-04-10 MED ORDER — PRAVASTATIN SODIUM 40 MG PO TABS
40.0000 mg | ORAL_TABLET | Freq: Every day | ORAL | Status: DC
  Administered 2016-04-10: 18:00:00 40 mg via ORAL
  Filled 2016-04-10: qty 1

## 2016-04-10 NOTE — H&P (Signed)
[place] [facility] [place] NAME: Kimberly Trevino    MR#:  [id number]  DATE OF BIRTH:  May 12, 1946  DATE OF ADMISSION:  04/10/2016  PRIMARY CARE PHYSICIAN: Annice Needy, MD   REQUESTING/REFERRING PHYSICIAN: Merlyn Lot MD  CHIEF COMPLAINT:   Chief Complaint  Patient presents with  . Extremity Weakness    HISTORY OF PRESENT ILLNESS: Kimberly Trevino  is a 70 y.o. female with a known history of  Essential hypertension, hyperlipidemia, PSVT and nonobstructive coronary artery disease who is presenting to the ED with complaint of having right upper extremity weakness which she woke up with. She reports that she could not lift her arm earlier. Could not hold things. This has gotten better since she has gotten to the ED. CT scan of the head is negative. She denies any other numbness or tingling or any visual difficulties. PAST MEDICAL HISTORY:   Past Medical History:  Diagnosis Date  . Coronary artery disease 2011   Cardiac cath in 2011 at [facility]: 50% mid LAD stenosis without evidence of obstructive disease. Normal ejection fraction. Nuclear stress test done in October of 2013 4 atypical chest pain was normal.  . Hyperlipidemia   . Hypertension   . PSVT (paroxysmal supraventricular tachycardia) ([place])   . Tachycardia 2010    PAST SURGICAL HISTORY: Past Surgical History:  Procedure Laterality Date  . ABDOMINAL HYSTERECTOMY    . CARDIAC CATHETERIZATION  2011   50% to the mid LAD lesion  . PARTIAL HYSTERECTOMY  1999  . TONSILLECTOMY      SOCIAL HISTORY:  Social History  Substance Use Topics  . Smoking status: Never Smoker  . Smokeless tobacco: Never Used  . Alcohol use No    FAMILY HISTORY:  Family History  Problem Relation Age of Onset  . Hypertension Mother   . Heart attack Father 27    MI  . Heart disease Father   . Hypertension Father   . Hypertension Sister   . Hyperlipidemia Sister   . Heart attack Brother     cardiac arrest  .  Hypertension Sister   . Hyperlipidemia Sister   . Breast cancer Neg Hx     DRUG ALLERGIES:  Allergies  Allergen Reactions  . Sulfa Antibiotics     REVIEW OF SYSTEMS:   CONSTITUTIONAL: No fever, fatigue or weakness.  EYES: No blurred or double vision.  EARS, NOSE, AND THROAT: No tinnitus or ear pain.  RESPIRATORY: No cough, shortness of breath, wheezing or hemoptysis.  CARDIOVASCULAR: No chest pain, orthopnea, edema.  GASTROINTESTINAL: No nausea, vomiting, diarrhea or abdominal pain.  GENITOURINARY: No dysuria, hematuria.  ENDOCRINE: No polyuria, nocturia,  HEMATOLOGY: No anemia, easy bruising or bleeding SKIN: No rash or lesion. MUSCULOSKELETAL: No joint pain or arthritis.   NEUROLOGIC: No tingling, numbness,  right upper extremity weakness PSYCHIATRY: No anxiety or depression.   MEDICATIONS AT HOME:  Prior to Admission medications   Medication Sig Start Date End Date Taking? Authorizing Provider  aspirin 81 MG tablet Take 81 mg by mouth daily.   Yes Historical Provider, MD  CARTIA XT 240 MG 24 hr capsule Take 240 mg by mouth daily. 03/29/16  Yes Historical Provider, MD  Chlorpheniramine Maleate (ALLERGY PO) Take by mouth daily.   Yes Historical Provider, MD  Cholecalciferol (VITAMIN D PO) Take by mouth daily.   Yes Historical Provider, MD  estradiol (ESTRACE) 0.5 MG tablet Take 0.5 mg by mouth daily.   Yes Historical Provider, MD  fluocinonide cream (LIDEX) [id number] % Apply 1 application topically as needed.  07/08/13  Yes Historical Provider, MD  fluocinonide cream (LIDEX) 0.05 % Apply two times a day as needed 04/04/16  Yes Historical Provider, MD  lisinopril (PRINIVIL,ZESTRIL) 20 MG tablet Take 20 mg by mouth daily.   Yes Historical Provider, MD  lovastatin (MEVACOR) 40 MG tablet Take 40 mg by mouth at bedtime.   Yes Historical Provider, MD  metoprolol succinate (TOPROL-XL) 100 MG 24 hr tablet Take 100 mg by mouth daily. Take with or immediately following a meal.   Yes  Historical Provider, MD  Misc Natural Products (GINKOGIN PO) Take by mouth daily.   Yes Historical Provider, MD  Multiple Vitamin (MULTIVITAMIN) tablet Take 1 tablet by mouth daily.   Yes Historical Provider, MD  mupirocin ointment (BACTROBAN) 2 % APPLY TOPICALLY THREE TIMES DAILY 04/06/16  Yes Historical Provider, MD  oxybutynin (DITROPAN) 5 MG tablet Take 5 mg by mouth daily.   Yes Historical Provider, MD  Valerian Root 100 MG CAPS Take 100 mg by mouth at bedtime.   Yes Historical Provider, MD  vitamin C (ASCORBIC ACID) 500 MG tablet Take 500 mg by mouth daily.   Yes Historical Provider, MD  diltiazem (CARDIZEM) 30 MG tablet Take 30 mg by mouth 4 (four) times daily as needed.    Historical Provider, MD  VENTOLIN HFA 108 (90 BASE) MCG/ACT inhaler Inhale 2 puffs into the lungs every 4 (four) hours as needed.  06/08/13   Historical Provider, MD      PHYSICAL EXAMINATION:   VITAL SIGNS: Blood pressure (!) 154/63, pulse 67, temperature 98 F (36.7 C), resp. rate 20, height 5\' 2"  (1.575 m), weight 175 lb (79.4 kg), SpO2 98 %.  GENERAL:  70 y.o.-year-old patient lying in the bed with no acute distress.  EYES: Pupils equal, round, reactive to light and accommodation. No scleral icterus. Extraocular muscles intact.  HEENT: Head atraumatic, normocephalic. Oropharynx and nasopharynx clear.  NECK:  Supple, no jugular venous distention. No thyroid enlargement, no tenderness.  LUNGS: Normal breath sounds bilaterally, no wheezing, rales,rhonchi or crepitation. No use of accessory muscles of respiration.  CARDIOVASCULAR: S1, S2 normal. No murmurs, rubs, or gallops.  ABDOMEN: Soft, nontender, nondistended. Bowel sounds present. No organomegaly or mass.  EXTREMITIES: No pedal edema, cyanosis, or clubbing.  NEUROLOGIC: Cranial nerves II through XII are intact. Muscle strength 4/5 In right upper extremity rest of the extremity strength is normal. Sensation intact. Gait not checked.  PSYCHIATRIC: The patient  is alert and oriented x 3.  SKIN: No obvious rash, lesion, or ulcer.   LABORATORY PANEL:   CBC  Recent Labs Lab 04/10/16 0746  WBC 3.6  HGB 14.8  HCT 41.4  PLT 278  MCV 83.7  MCH 29.9  MCHC 35.7  RDW 13.7  LYMPHSABS 1.2  MONOABS 0.4  EOSABS 0.2  BASOSABS 0.0   ------------------------------------------------------------------------------------------------------------------  Chemistries   Recent Labs Lab 04/10/16 0746  NA 140  K 3.6  CL 109  CO2 24  GLUCOSE 144*  BUN 15  CREATININE 0.78  CALCIUM 9.5  MG 2.0  AST 31  ALT 25  ALKPHOS 64  BILITOT 0.7   ------------------------------------------------------------------------------------------------------------------ estimated creatinine clearance is 64.8 mL/min (by C-G formula based on SCr of 0.78 mg/dL). ------------------------------------------------------------------------------------------------------------------ No results for input(s): TSH, T4TOTAL, T3FREE, THYROIDAB in the last 72 hours.  Invalid input(s): FREET3   Coagulation profile  Recent Labs Lab 04/10/16 0746  INR 0.92   ------------------------------------------------------------------------------------------------------------------- No results for  input(s): DDIMER in the last 72 hours. -------------------------------------------------------------------------------------------------------------------  Cardiac Enzymes  Recent Labs Lab 04/10/16 0746  TROPONINI <0.03   ------------------------------------------------------------------------------------------------------------------ Invalid input(s): POCBNP  ---------------------------------------------------------------------------------------------------------------  Urinalysis No results found for: COLORURINE, APPEARANCEUR, LABSPEC, PHURINE, GLUCOSEU, HGBUR, BILIRUBINUR, KETONESUR, PROTEINUR, UROBILINOGEN, NITRITE, LEUKOCYTESUR   RADIOLOGY: Dg Chest 2 View  Result Date:  04/10/2016 CLINICAL DATA:  70 year old female with acute right upper extremity weakness, possible pneumonia EXAM: CHEST  2 VIEW COMPARISON:  Prior chest x-ray and chest CT 02/21/2015 FINDINGS: The lungs are clear and negative for focal airspace consolidation, pulmonary edema or suspicious pulmonary nodule. No pleural effusion or pneumothorax. Cardiac and mediastinal contours are within normal limits. No acute fracture or lytic or blastic osseous lesions. The visualized upper abdominal bowel gas pattern is unremarkable. Incompletely imaged anterior cervical fusion hardware. Radiopacity is projecting over the left upper quadrant may represent retained oral contrast material within colonic diverticulum. IMPRESSION: No active cardiopulmonary disease. Electronically Signed   By: Jacqulynn Cadet M.D.   On: 04/10/2016 08:29   Ct Head Wo Contrast  Result Date: 04/10/2016 CLINICAL DATA:  70 year old female with right upper extremity weakness and heaviness since 0500 hours. Initial encounter. EXAM: CT HEAD WITHOUT CONTRAST TECHNIQUE: Contiguous axial images were obtained from the base of the skull through the vertex without intravenous contrast. COMPARISON:  Cervical spine MRI 07/13/2014. FINDINGS: Brain: No midline shift, ventriculomegaly, mass effect, evidence of mass lesion, intracranial hemorrhage or evidence of cortically based acute infarction. Gray-white matter differentiation is within normal limits throughout the brain. Cerebral volume is within normal limits for age. Vascular: No definite abnormal vessel hyperdensity. Skull: No acute osseous abnormality identified. Sinuses/Orbits: Minor ethmoid sinus mucosal thickening, otherwise clear. Other: Negative orbit and scalp soft tissues. ASPECTS [facility] Stroke Program Early CT Score) - Ganglionic level infarction (caudate, lentiform nuclei, internal capsule, insula, M1-M3 cortex): 7 - Supraganglionic infarction (M4-M6 cortex): 3 Total score (0-10 with 10 being  normal): 10 IMPRESSION: 1. Normal for age non contrast CT appearance of the brain. 2. ASPECTS is 10. 3. Study discussed by telephone with Dr. Merlyn Lot on 04/10/2016 at 08:07 . Electronically Signed   By: Genevie Ann M.D.   On: 04/10/2016 08:08    EKG: Orders placed or performed during the hospital encounter of 04/10/16  . ED EKG  . ED EKG    IMPRESSION AND PLAN: Patient is a 70 year old presenting with right upper extremity weakness  1. TIA Continue aspirin as taking at home. I will order MRI/MRA of the brain and carotid Dopplers and echocardiogram of the heart Physical therapy and occupational therapy evaluation  2. Essential hypertension we'll continue metoprolol and lisinopril  3. psvt tele continue metoprolol  4. Misc: lovenox    All the records are reviewed and case discussed with ED provider. Management plans discussed with the patient, family and they are in agreement.  CODE STATUS: Code Status History    This patient does not have a recorded code status. Please follow your organizational policy for patients in this situation.       TOTAL TIME TAKING CARE OF THIS PATIENT: 59minutes.    Dustin Flock M.D on 04/10/2016 at 8:59 AM  Between 7am to 6pm - Pager - [phone]  After 6pm go to [email or web address] - password [email or web address] [place] [facility]  Office  [phone]  CC: Primary care physician; Annice Needy, MD

## 2016-04-10 NOTE — Care Management Obs Status (Signed)
[facility] NOTIFICATION   Patient Details  Name: Alauna Urdiales MRN: [id number] Date of Birth: 05/03/1946   Medicare Observation Status Notification Given:  Yes    Ival Bible, RN 04/10/2016, 2:14 PM

## 2016-04-10 NOTE — ED Notes (Addendum)
Pt reports that when she got up this am she felt fine but had little to no use of right arm - she was unable to make a fist or even brush her hair - states right arm felt heavy and weak - pt states symptoms have gotten better - observed pt make fist with both hands - she was decreased on right side with slight drift noted - PEARL - denies problems with speech - denies difficulty walking - A&O x4 - Dr Quentin Cornwall at bedside

## 2016-04-10 NOTE — ED Notes (Signed)
Patient transported to CT 

## 2016-04-10 NOTE — ED Provider Notes (Signed)
[facility] Emergency Department Provider Note    First MD Initiated Contact with Patient 04/10/16 [phone]     (approximate)  I have reviewed the triage vital signs and the nursing notes.   HISTORY  Chief Complaint Extremity Weakness    HPI Kimberly Trevino is a 70 y.o. female with a history of CAD, hyperlipidemia, hypertension presenting with acute right upper extremity weakness and heaviness that was first noticed this morning when she woke up from sleep at 6 AM. Patient went to bed last night with no symptoms. Patient states that she was getting dressed this morning and noted that she was having difficulty coming her hair and feeling that her right arm was heavy. Symptoms lasted over 2 hours with some improvement in her weakness. Bedtime she comes in the ER she states that she has some strength back but that she still feels a heaviness in her right arm. She denies any chest pain or shortness of breath. No radiation to her jaw. No visual disturbance. No difficulty ambulating. No recent trauma. She does not take any blood thinners. She does take a baby aspirin.   Past Medical History:  Diagnosis Date  . Coronary artery disease 2011   Cardiac cath in 2011 at [facility]: 50% mid LAD stenosis without evidence of obstructive disease. Normal ejection fraction. Nuclear stress test done in October of 2013 4 atypical chest pain was normal.  . Hyperlipidemia   . Hypertension   . PSVT (paroxysmal supraventricular tachycardia) ([place])   . Tachycardia 2010   Family History  Problem Relation Age of Onset  . Hypertension Mother   . Heart attack Father 66    MI  . Heart disease Father   . Hypertension Father   . Hypertension Sister   . Hyperlipidemia Sister   . Heart attack Brother     cardiac arrest  . Hypertension Sister   . Hyperlipidemia Sister   . Breast cancer Neg Hx    Past Surgical History:  Procedure Laterality Date  . ABDOMINAL HYSTERECTOMY    . CARDIAC  CATHETERIZATION  2011   50% to the mid LAD lesion  . PARTIAL HYSTERECTOMY  1999  . TONSILLECTOMY     Patient Active Problem List   Diagnosis Date Noted  . TIA (transient ischemic attack) 04/10/2016  . Leg edema 12/04/2012  . PSVT (paroxysmal supraventricular tachycardia) ([place])   . Coronary artery disease   . Hyperlipidemia   . Hypertension       Prior to Admission medications   Medication Sig Start Date End Date Taking? Authorizing Provider  aspirin 81 MG tablet Take 81 mg by mouth daily.   Yes Historical Provider, MD  CARTIA XT 240 MG 24 hr capsule Take 240 mg by mouth daily. 03/29/16  Yes Historical Provider, MD  Chlorpheniramine Maleate (ALLERGY PO) Take by mouth daily.   Yes Historical Provider, MD  Cholecalciferol (VITAMIN D PO) Take by mouth daily.   Yes Historical Provider, MD  estradiol (ESTRACE) 0.5 MG tablet Take 0.5 mg by mouth daily.   Yes Historical Provider, MD  fluocinonide cream (LIDEX) [id number] % Apply 1 application topically as needed.  07/08/13  Yes Historical Provider, MD  fluocinonide cream (LIDEX) 0.05 % Apply two times a day as needed 04/04/16  Yes Historical Provider, MD  lisinopril (PRINIVIL,ZESTRIL) 20 MG tablet Take 20 mg by mouth daily.   Yes Historical Provider, MD  lovastatin (MEVACOR) 40 MG tablet Take 40 mg by mouth at bedtime.  Yes Historical Provider, MD  metoprolol succinate (TOPROL-XL) 100 MG 24 hr tablet Take 100 mg by mouth daily. Take with or immediately following a meal.   Yes Historical Provider, MD  Misc Natural Products (GINKOGIN PO) Take by mouth daily.   Yes Historical Provider, MD  Multiple Vitamin (MULTIVITAMIN) tablet Take 1 tablet by mouth daily.   Yes Historical Provider, MD  mupirocin ointment (BACTROBAN) 2 % APPLY TOPICALLY THREE TIMES DAILY 04/06/16  Yes Historical Provider, MD  oxybutynin (DITROPAN) 5 MG tablet Take 5 mg by mouth daily.   Yes Historical Provider, MD  Valerian Root 100 MG CAPS Take 100 mg by mouth at bedtime.   Yes  Historical Provider, MD  vitamin C (ASCORBIC ACID) 500 MG tablet Take 500 mg by mouth daily.   Yes Historical Provider, MD  diltiazem (CARDIZEM) 30 MG tablet Take 30 mg by mouth 4 (four) times daily as needed.    Historical Provider, MD  VENTOLIN HFA 108 (90 BASE) MCG/ACT inhaler Inhale 2 puffs into the lungs every 4 (four) hours as needed.  06/08/13   Historical Provider, MD    Allergies Sulfa antibiotics    Social History Social History  Substance Use Topics  . Smoking status: Never Smoker  . Smokeless tobacco: Never Used  . Alcohol use No    Review of Systems Patient denies headaches, rhinorrhea, blurry vision, numbness, shortness of breath, chest pain, edema, cough, abdominal pain, nausea, vomiting, diarrhea, dysuria, fevers, rashes or hallucinations unless otherwise stated above in HPI. ____________________________________________   PHYSICAL EXAM:  VITAL SIGNS: Vitals:   04/10/16 1425 04/10/16 1545  BP: (!) 170/70 (!) 158/72  Pulse: (!) 58 64  Resp:    Temp: 97.5 F (36.4 C) 98.2 F (36.8 C)    Constitutional: Alert and oriented. Well appearing and in no acute distress. Eyes: Conjunctivae are normal. PERRL. EOMI. Head: Atraumatic. Nose: No congestion/rhinnorhea. Mouth/Throat: Mucous membranes are moist.  Oropharynx non-erythematous. Neck: No stridor. Painless ROM. No cervical spine tenderness to palpation Hematological/Lymphatic/Immunilogical: No cervical lymphadenopathy. Cardiovascular: Normal rate, regular rhythm. Grossly normal heart sounds.  Good peripheral circulation. Respiratory: Normal respiratory effort.  No retractions. Lungs CTAB. Gastrointestinal: Soft and nontender. No distention. No abdominal bruits. No CVA tenderness. Genitourinary:  Musculoskeletal: No lower extremity tenderness nor edema.  No joint effusions. Neurologic:  CN- intact.  No facial droop, Normal FNF.  Normal heel to shin.  Sensation intact bilaterally. Normal speech and language. 4/5  grip, bicep, and tricep of RUE.  5/5 strenght in LUE and BLE.  Skin:  Skin is warm, dry and intact. No rash noted. Psychiatric: Mood and affect are normal. Speech and behavior are normal.  ____________________________________________   LABS (all labs ordered are listed, but only abnormal results are displayed)  Results for orders placed or performed during the hospital encounter of 04/10/16 (from the past 24 hour(s))  Ethanol     Status: None   Collection Time: 04/10/16  7:46 AM  Result Value Ref Range   Alcohol, Ethyl (B) <5 <5 mg/dL  Protime-INR     Status: None   Collection Time: 04/10/16  7:46 AM  Result Value Ref Range   Prothrombin Time 12.3 11.4 - 15.2 seconds   INR 0.92   APTT     Status: None   Collection Time: 04/10/16  7:46 AM  Result Value Ref Range   aPTT 27 24 - 36 seconds  CBC     Status: None   Collection Time: 04/10/16  7:46 AM  Result Value Ref Range   WBC 3.6 3.6 - 11.0 K/uL   RBC 4.94 3.80 - 5.20 MIL/uL   Hemoglobin 14.8 12.0 - 16.0 g/dL   HCT 41.4 35.0 - 47.0 %   MCV 83.7 80.0 - 100.0 fL   MCH 29.9 26.0 - 34.0 pg   MCHC 35.7 32.0 - 36.0 g/dL   RDW 13.7 11.5 - 14.5 %   Platelets 278 150 - 440 K/uL  Differential     Status: None   Collection Time: 04/10/16  7:46 AM  Result Value Ref Range   Neutrophils Relative % 50 %   Neutro Abs 1.8 1.4 - 6.5 K/uL   Lymphocytes Relative 32 %   Lymphs Abs 1.2 1.0 - 3.6 K/uL   Monocytes Relative 10 %   Monocytes Absolute 0.4 0.2 - 0.9 K/uL   Eosinophils Relative 7 %   Eosinophils Absolute 0.2 0 - 0.7 K/uL   Basophils Relative 1 %   Basophils Absolute 0.0 0 - 0.1 K/uL  Comprehensive metabolic panel     Status: Abnormal   Collection Time: 04/10/16  7:46 AM  Result Value Ref Range   Sodium 140 135 - 145 mmol/L   Potassium 3.6 3.5 - 5.1 mmol/L   Chloride 109 101 - 111 mmol/L   CO2 24 22 - 32 mmol/L   Glucose, Bld 144 (H) 65 - 99 mg/dL   BUN 15 6 - 20 mg/dL   Creatinine, Ser 0.78 0.44 - 1.00 mg/dL   Calcium 9.5  8.9 - 10.3 mg/dL   Total Protein 7.5 6.5 - 8.1 g/dL   Albumin 4.4 3.5 - 5.0 g/dL   AST 31 15 - 41 U/L   ALT 25 14 - 54 U/L   Alkaline Phosphatase 64 38 - 126 U/L   Total Bilirubin 0.7 0.3 - 1.2 mg/dL   GFR calc non Af Amer >60 >60 mL/min   GFR calc Af Amer >60 >60 mL/min   Anion gap 7 5 - 15  Troponin I     Status: None   Collection Time: 04/10/16  7:46 AM  Result Value Ref Range   Troponin I <0.03 <0.03 ng/mL  Magnesium     Status: None   Collection Time: 04/10/16  7:46 AM  Result Value Ref Range   Magnesium 2.0 1.7 - 2.4 mg/dL  Urinalysis complete, with microscopic ([facility] only)     Status: Abnormal   Collection Time: 04/10/16  9:24 AM  Result Value Ref Range   Color, Urine COLORLESS (A) YELLOW   APPearance CLEAR (A) CLEAR   Glucose, UA NEGATIVE NEGATIVE mg/dL   Bilirubin Urine NEGATIVE NEGATIVE   Ketones, ur NEGATIVE NEGATIVE mg/dL   Specific Gravity, Urine 1.003 (L) 1.005 - 1.030   Hgb urine dipstick NEGATIVE NEGATIVE   pH 7.0 5.0 - 8.0   Protein, ur NEGATIVE NEGATIVE mg/dL   Nitrite NEGATIVE NEGATIVE   Leukocytes, UA NEGATIVE NEGATIVE   RBC / HPF NONE SEEN 0 - 5 RBC/hpf   WBC, UA 0-5 0 - 5 WBC/hpf   Bacteria, UA RARE (A) NONE SEEN   Squamous Epithelial / LPF 0-5 (A) NONE SEEN   ____________________________________________  EKG My review and personal interpretation at Time: 7:31   Indication: cva  Rate: 70  Rhythm: sinus Axis: normal Other: borderling prolonged Qt, non specific t wave changes, no acute ischemia ____________________________________________  RADIOLOGY  I personally reviewed all radiographic images ordered to evaluate for the above acute complaints and reviewed radiology reports and findings.  These findings were personally discussed with the patient.  Please see medical record for radiology report.  ____________________________________________   PROCEDURES  Procedure(s) performed: none Procedures    Critical Care performed:  no ____________________________________________   INITIAL IMPRESSION / ASSESSMENT AND PLAN / ED COURSE  Pertinent labs & imaging results that were available during my care of the patient were reviewed by me and considered in my medical decision making (see chart for details).  DDX: cva, acs, tia, dehydration  Kimberly Trevino is a 70 y.o. who presents to the ED with right upper extremity weakness. Patient outside of the window to receive TPA. Does have neuro deficit on exam. We will order emergent CT imaging to evaluate for bleed versus early ischemia. We'll order laboratory evaluation to identify any underlying electrolyte abnormalities. Patient otherwise seemed dynamically stable and in no acute distress.  The patient will be placed on continuous pulse oximetry and telemetry for monitoring.  Laboratory evaluation will be sent to evaluate for the above complaints.     Clinical Course as of Apr 10 1602  Nancy Fetter Apr 10, 2016  0840 CTA shows no evidence of bleed or significant infarct. Work otherwise reassuring. Not consistent with ACS. Symptoms are improving in the ER making this more consistent with TIA. Based on patient's risk factors do feel patient will require admission for further evaluation and management.  Have discussed with the patient and available family all diagnostics and treatments performed thus far and all questions were answered to the best of my ability. The patient demonstrates understanding and agreement with plan.   [PR]    Clinical Course User Index [PR] Merlyn Lot, MD     ____________________________________________   FINAL CLINICAL IMPRESSION(S) / ED DIAGNOSES  Final diagnoses:  Transient cerebral ischemia, unspecified type  RUE weakness      NEW MEDICATIONS STARTED DURING THIS VISIT:  Current Discharge Medication List       Note:  This document was prepared using Dragon voice recognition software and may include unintentional dictation  errors.    Merlyn Lot, MD 04/10/16 [phone]

## 2016-04-10 NOTE — ED Triage Notes (Signed)
Pt reports went to bed normal last night and when woke up this morning was having right arm weakness and heaviness. Reports had trouble coming hair and using right hand. No facial droop. Right grip slightly weaker than left. No other weakness. Speech WNL

## 2016-04-10 NOTE — ED Notes (Signed)
Pt urinated in bathroom without giving urine sample - advised pt that the next time she had to use the restroom that I needed a urine sample - pt verbalized undertanding

## 2016-04-11 ENCOUNTER — Observation Stay: Payer: Medicare Other

## 2016-04-11 ENCOUNTER — Observation Stay
Admit: 2016-04-11 | Discharge: 2016-04-11 | Disposition: A | Discharge status: Home / Self Care | Payer: Medicare Other | Attending: Internal Medicine | Admitting: Internal Medicine

## 2016-04-11 DIAGNOSIS — I639 Cerebral infarction, unspecified: Secondary | ICD-10-CM | POA: Diagnosis not present

## 2016-04-11 DIAGNOSIS — I471 Supraventricular tachycardia: Secondary | ICD-10-CM | POA: Diagnosis present

## 2016-04-11 DIAGNOSIS — R402364 Coma scale, best motor response, obeys commands, 24 hours or more after hospital admission: Secondary | ICD-10-CM | POA: Diagnosis present

## 2016-04-11 DIAGNOSIS — R402144 Coma scale, eyes open, spontaneous, 24 hours or more after hospital admission: Secondary | ICD-10-CM | POA: Diagnosis present

## 2016-04-11 DIAGNOSIS — R29701 NIHSS score 1: Secondary | ICD-10-CM | POA: Diagnosis present

## 2016-04-11 DIAGNOSIS — Z8249 Family history of ischemic heart disease and other diseases of the circulatory system: Secondary | ICD-10-CM | POA: Diagnosis not present

## 2016-04-11 DIAGNOSIS — E785 Hyperlipidemia, unspecified: Secondary | ICD-10-CM | POA: Diagnosis present

## 2016-04-11 DIAGNOSIS — Z7982 Long term (current) use of aspirin: Secondary | ICD-10-CM | POA: Diagnosis not present

## 2016-04-11 DIAGNOSIS — Z8241 Family history of sudden cardiac death: Secondary | ICD-10-CM | POA: Diagnosis not present

## 2016-04-11 DIAGNOSIS — I63512 Cerebral infarction due to unspecified occlusion or stenosis of left middle cerebral artery: Secondary | ICD-10-CM

## 2016-04-11 DIAGNOSIS — I10 Essential (primary) hypertension: Secondary | ICD-10-CM | POA: Diagnosis present

## 2016-04-11 DIAGNOSIS — Z882 Allergy status to sulfonamides status: Secondary | ICD-10-CM | POA: Diagnosis not present

## 2016-04-11 DIAGNOSIS — I2510 Atherosclerotic heart disease of native coronary artery without angina pectoris: Secondary | ICD-10-CM | POA: Diagnosis present

## 2016-04-11 DIAGNOSIS — R531 Weakness: Secondary | ICD-10-CM | POA: Diagnosis present

## 2016-04-11 DIAGNOSIS — R402254 Coma scale, best verbal response, oriented, 24 hours or more after hospital admission: Secondary | ICD-10-CM | POA: Diagnosis present

## 2016-04-11 DIAGNOSIS — G8321 Monoplegia of upper limb affecting right dominant side: Secondary | ICD-10-CM | POA: Diagnosis present

## 2016-04-11 DIAGNOSIS — N3281 Overactive bladder: Secondary | ICD-10-CM | POA: Diagnosis present

## 2016-04-11 DIAGNOSIS — Z79890 Hormone replacement therapy (postmenopausal): Secondary | ICD-10-CM | POA: Diagnosis not present

## 2016-04-11 DIAGNOSIS — I6340 Cerebral infarction due to embolism of unspecified cerebral artery: Secondary | ICD-10-CM | POA: Diagnosis present

## 2016-04-11 HISTORY — DX: Cerebral infarction due to unspecified occlusion or stenosis of left middle cerebral artery: I63.512

## 2016-04-11 LAB — LIPID PANEL
CHOL/HDL RATIO: 5.8 ratio
Cholesterol: 208 mg/dL — ABNORMAL HIGH (ref 0–200)
HDL: 36 mg/dL — AB (ref >40)
LDL CALC: 113 mg/dL — AB (ref 0–99)
TRIGLYCERIDES: 294 mg/dL — AB (ref <150)
VLDL: 59 mg/dL — ABNORMAL HIGH (ref 0–40)

## 2016-04-11 LAB — ECHOCARDIOGRAM COMPLETE
Height: 62 in
Weight: 2800 oz

## 2016-04-11 MED ORDER — ROSUVASTATIN CALCIUM 10 MG PO TABS
5.0000 mg | ORAL_TABLET | Freq: Every day | ORAL | Status: DC
  Administered 2016-04-11: 18:00:00 5 mg via ORAL
  Filled 2016-04-11: qty 1

## 2016-04-11 MED ORDER — ACETAMINOPHEN 325 MG PO TABS
650.0000 mg | ORAL_TABLET | Freq: Four times a day (QID) | ORAL | Status: DC | PRN
  Administered 2016-04-11: 650 mg via ORAL
  Filled 2016-04-11: qty 2

## 2016-04-11 MED ORDER — CLOPIDOGREL BISULFATE 75 MG PO TABS
75.0000 mg | ORAL_TABLET | Freq: Every day | ORAL | Status: DC
  Administered 2016-04-11 – 2016-04-12 (×2): 75 mg via ORAL
  Filled 2016-04-11 (×2): qty 1

## 2016-04-11 MED ORDER — LORAZEPAM 2 MG/ML IJ SOLN
1.0000 mg | Freq: Once | INTRAMUSCULAR | Status: AC
Start: 2016-04-11 — End: 2016-04-11
  Administered 2016-04-11: 1 mg via INTRAVENOUS
  Filled 2016-04-11: qty 1

## 2016-04-11 MED ORDER — PRAVASTATIN SODIUM 40 MG PO TABS: 40.0000 mg | ORAL_TABLET | Freq: Every day | ORAL | 0 refills | Status: DC

## 2016-04-11 NOTE — Progress Notes (Signed)
*  PRELIMINARY RESULTS* Echocardiogram 2D Echocardiogram has been performed.  Kimberly Trevino 04/11/2016, 10:10 AM

## 2016-04-11 NOTE — Progress Notes (Signed)
OT Cancellation Note  Patient Details Name: Kimberly Trevino MRN: [id number] DOB: August 31, 1945   Cancelled Treatment:    Reason Eval/Treat Not Completed: Patient at procedure or test/ unavailable  Harrel Carina, MS, OTR/L 04/11/2016, 10:25 AM

## 2016-04-11 NOTE — Evaluation (Addendum)
Occupational Therapy Evaluation Patient Details Name: Kimberly Trevino MRN: [id number] DOB: 1945/08/07 Today's Date: 04/11/2016    History of Present Illness Pt. is a 70 y.o. female who was admitted to [facility] with Left Frontal Lobe Infarcts. Pt. has a history of Essential Hypertension, Hyperlipidemia, PSVT, and Nonobstructive CAD.   Clinical Impression   Pt. Is a 70 y.o female who was admitted to [facility] with Left Frontal Lobe Infarcts. Pt. presents with RUE, hand, and grip strength weakness. Pt. Is able to use her right hand to engage in and complete basic ADL and IADL tasks. Pt. was provided with pink theraputty, and a HEP. Pt. and family education was provided. Pt. could benefit from skilled OT services to review HEP, and Theraputty ex. Pt. plans to return home at discharge with her husband.      Follow Up Recommendations  No OT follow up    Equipment Recommendations       Recommendations for Other Services       Precautions / Restrictions Precautions Precautions: None Restrictions Weight Bearing Restrictions: No      Mobility Bed Mobility Overal bed mobility: Independent             General bed mobility comments: Good speed/sequencing with HOB flat and no bed rails  Transfers Overall transfer level: Independent               General transfer comment: Pt with good speed/sequencing with sit to stand transfer. Once in standing pt initially stable without UE support. However after 5 seconds she stumbles backwards but is able to self-correct. Suspect medication related as pt is still drowsy from Ativan received prior to MRI    Balance Overall balance assessment: Needs assistance Sitting-balance support: No upper extremity supported Sitting balance-Leahy Scale: Normal     Standing balance support: No upper extremity supported Standing balance-Leahy Scale: Fair Standing balance comment: Pt initially with one posterior stumble in standing but able to  self-correct. Reports feeling drowsy from Ativan prior to MRI. Mild lateral gait deviations during ambulation with horizontal and vertical head turns                            ADL Overall ADL's : Needs assistance/impaired                                       General ADL Comments: Pt. is able to use her right hand to complete basici ADL tasks.     Vision     Perception     Praxis      Pertinent Vitals/Pain Pain Assessment: No/denies pain     Hand Dominance Right   Extremity/Trunk Assessment Upper Extremity Assessment Upper Extremity Assessment: RUE deficits/detail;Generalized weakness RUE Deficits / Details:  RUE strength: Shoulder flexion, Abduction: 4/5, elbow flexion, extension 5/5, wrist flexion extension 4/5. Pt. has intact sensation, and proprioceptive awareness. Gip strength: Right: 33#, Left 40#.   Lower Extremity Assessment Lower Extremity Assessment: Overall WFL for tasks assessed       Communication Communication Communication: No difficulties   Cognition Arousal/Alertness: Awake/alert Behavior During Therapy: WFL for tasks assessed/performed Overall Cognitive Status: Within Functional Limits for tasks assessed                     General Comments       Exercises  Shoulder Instructions      Home Living Family/patient expects to be discharged to:: Private residence Living Arrangements: Spouse/significant other Available Help at Discharge: Family Type of Home: House Home Access: Stairs to enter Technical brewer of Steps: 2 Entrance Stairs-Rails: Can reach both Home Layout: One level     Bathroom Shower/Tub: Occupational psychologist: Standard     Home Equipment: None   Additional Comments: No assistive devices      Prior Functioning/Environment Level of Independence: Independent        Comments: Independent with ADLs, IADLs, diving, and working.        OT Problem List:      OT Treatment/Interventions: Self-care/ADL training;Therapeutic exercise;Therapeutic activities;Energy conservation;Patient/family education    OT Goals(Current goals can be found in the care plan section) Acute Rehab OT Goals Patient Stated Goal: To return home. OT Goal Formulation: With patient Potential to Achieve Goals: Good ADL Goals Additional ADL Goal #1: Pt. will be independent with HEP for right hand.  OT Frequency: Min 2X/week   Barriers to D/C:            Co-evaluation              End of Session    Activity Tolerance: Patient tolerated treatment well Patient left: in bed;with call bell/phone within reach;with bed alarm set;with family/visitor present   Time: 1040-1110 OT Time Calculation (min): 30 min Charges:  OT General Charges $OT Visit: 1 Procedure OT Evaluation $OT Eval Moderate Complexity: 1 Procedure OT Treatments $Self Care/Home Management : 8-22 mins G-Codes: OT G-codes **NOT FOR INPATIENT CLASS** Functional Assessment Tool Used: Clinical judgement based on pt. current functional status, dynamometer, MMT Functional Limitation: Carrying, moving and handling objects Carrying, Moving and Handling Objects Current Status [id number]): At least 1 percent but less than 20 percent impaired, limited or restricted Carrying, Moving and Handling Objects Goal Status [id number]): 0 percent impaired, limited or restricted  Harrel Carina, MS, OTR/L 04/11/2016, 2:01 PM

## 2016-04-11 NOTE — Evaluation (Signed)
Physical Therapy Evaluation Patient Details Name: Kimberly Trevino MRN: [id number] DOB: 30-Dec-1945 Today's Date: 04/11/2016   History of Present Illness  Kimberly Trevino  is a 70 y.o. female with a known history of  Essential hypertension, hyperlipidemia, PSVT and nonobstructive coronary artery disease who is presenting to the ED with complaint of having right upper extremity weakness which she woke up with. She reports that she could not lift her arm earlier. Could not hold things. This has gotten better since she has gotten to the ED. CT scan of the head is negative. She denies any other numbness or tingling or any visual difficulties. 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 Pt reports that all of her symptoms have resolved at this time  Clinical Impression  Pt admitted with above diagnosis. Pt currently with functional limitations due to the deficits listed below (see PT Problem List).  Pt reports return to her baseline function. She denies any further RUE weakness. No RUE weakness identified on exam except for possible mild R grip strength decrease. Difficult to assess by gross examination and OT asked to investigate further. Otherwise pt is neurologically intact without focal weakness or sensory deficits. Finger to nose and pronator drift negative. Pt is independent with bed mobility and transfers and requires CGA for ambulation in hallway. She does present with a mild stumble in standing but is able to self-correct. She also presents with mild lateral gait deviation with horizontal/vertical head turns. Pt reports feeling drowsy from Ativan administered prior to MRI and suspect that mild balance deficits related to this. Will keep patient on caseload for one more treatment session to ensure continued improvement in balance and no emergence of any new symptoms. If pt remains independent without deficits will discharge from PT on next date. Pt will benefit from skilled PT services  to address deficits in strength, balance, and mobility in order to return to full function at home.     Follow Up Recommendations No PT follow up    Equipment Recommendations  None recommended by PT    Recommendations for Other Services       Precautions / Restrictions Precautions Precautions: None Restrictions Weight Bearing Restrictions: No      Mobility  Bed Mobility Overal bed mobility: Independent             General bed mobility comments: Good speed/sequencing with HOB flat and no bed rails  Transfers Overall transfer level: Independent               General transfer comment: Pt with good speed/sequencing with sit to stand transfer. Once in standing pt initially stable without UE support. However after 5 seconds she stumbles backwards but is able to self-correct. Suspect medication related as pt is still drowsy from Ativan received prior to MRI  Ambulation/Gait Ambulation/Gait assistance: Min guard Ambulation Distance (Feet): 120 Feet Assistive device: None Gait Pattern/deviations: WFL(Within Functional Limits)     General Gait Details: Pt able to ambulate 1/4 way around RN station and back to room without assistive device. Able to perform horizontal and vertical head turns demonstrating mild lateral gait deviation. Denies DOE during ambulation and gait speed is functional for limited community Research officer, trade [employer] (Stroke Patients Only)       Balance Overall balance assessment: Needs assistance Sitting-balance support: No upper extremity supported Sitting balance-Leahy Scale: Normal  Standing balance support: No upper extremity supported Standing balance-Leahy Scale: Fair Standing balance comment: Pt initially with one posterior stumble in standing but able to self-correct. Reports feeling drowsy from Ativan prior to MRI. Mild lateral gait deviations during ambulation with horizontal and  vertical head turns                             Pertinent Vitals/Pain Pain Assessment: No/denies pain    Home Living Family/patient expects to be discharged to:: Private residence Living Arrangements: Spouse/significant other Available Help at Discharge: Family Type of Home: House Home Access: Stairs to enter Entrance Stairs-Rails: Psychiatric nurse of Steps: 2 Home Layout: One level Home Equipment: None Additional Comments: No assistive devices    Prior Function Level of Independence: Independent         Comments: Fully independent with ADLs/IADLs     Hand Dominance   Dominant Hand: Right    Extremity/Trunk Assessment   Upper Extremity Assessment: Overall WFL for tasks assessed;RUE deficits/detail RUE Deficits / Details: Denies N/T and senation intact to light touch. Negative pronator drift and finger to nose. Possibly slight decrease in R grip strength. OT to evaluate further         Lower Extremity Assessment: Overall WFL for tasks assessed         Communication   Communication: No difficulties  Cognition Arousal/Alertness: Awake/alert Behavior During Therapy: WFL for tasks assessed/performed Overall Cognitive Status: Within Functional Limits for tasks assessed                      General Comments      Exercises     Assessment/Plan    PT Assessment Patient needs continued PT services  PT Problem List Decreased balance          PT Treatment Interventions Gait training;Stair training;Balance training;Therapeutic exercise;Patient/family education    PT Goals (Current goals can be found in the Care Plan section)  Acute Rehab PT Goals Patient Stated Goal: Return to prior level of function at home PT Goal Formulation: With patient/family Time For Goal Achievement: 04/25/16 Potential to Achieve Goals: Good    Frequency 7X/week   Barriers to discharge        Co-evaluation               End of  Session Equipment Utilized During Treatment: Gait belt Activity Tolerance: Patient tolerated treatment well Patient left: in bed;with call bell/phone within reach;with bed alarm set;with family/visitor present Nurse Communication: Mobility status    Functional Assessment Tool Used: clinical judgement Functional Limitation: Mobility: Walking and moving around Mobility: Walking and Moving Around Current Status [id number]): At least 1 percent but less than 20 percent impaired, limited or restricted Mobility: Walking and Moving Around Goal Status [phone]): 0 percent impaired, limited or restricted    Time: [id number] PT Time Calculation (min) (ACUTE ONLY): 16 min   Charges:   PT Evaluation $PT Eval Low Complexity: 1 Procedure     PT G Codes:   PT G-Codes **NOT FOR INPATIENT CLASS** Functional Assessment Tool Used: clinical judgement Functional Limitation: Mobility: Walking and moving around Mobility: Walking and Moving Around Current Status [id number]): At least 1 percent but less than 20 percent impaired, limited or restricted Mobility: Walking and Moving Around Goal Status [phone]): 0 percent impaired, limited or restricted   Phillips Grout PT, DPT   Mehtaab Mayeda 04/11/2016, 10:51 AM

## 2016-04-11 NOTE — Progress Notes (Signed)
PT Attempt Note  Patient Details Name: Ruchama Wilkowski MRN: [id number] DOB: 11/12/1945   Cancelled Treatment:    Reason Eval/Treat Not Completed: Patient at procedure or test/unavailable . Chart reviewed. Attempted to evaluation patient but out of room for testing. Will attempt at later time/date as pt is available and appropriate.  Lyndel Safe Paulino Cork PT, DPT   Fujie Dickison 04/11/2016, 9:00 AM

## 2016-04-11 NOTE — Discharge Summary (Addendum)
Kimberly Trevino, is a 70 y.o. female  DOB 10/05/1945  MRN [id number].  Admission date:  04/10/2016  Admitting Physician  Dustin Flock, MD  Discharge Date:  04/11/2016   Primary MD  Annice Needy, MD  Recommendations for primary care physician for things to follow:   Follow Up with primary doctor in 1 week   Admission Diagnosis  RUE weakness [R29.898] Transient cerebral ischemia, unspecified type [G45.9]   Discharge Diagnosis  RUE weakness [R29.898] Transient cerebral ischemia, unspecified type [G45.9]    Active Problems:   TIA (transient ischemic attack)      Past Medical History:  Diagnosis Date  . Coronary artery disease 2011   Cardiac cath in 2011 at [facility]: 50% mid LAD stenosis without evidence of obstructive disease. Normal ejection fraction. Nuclear stress test done in October of 2013 4 atypical chest pain was normal.  . Hyperlipidemia   . Hypertension   . PSVT (paroxysmal supraventricular tachycardia) ([place])   . Tachycardia 2010    Past Surgical History:  Procedure Laterality Date  . ABDOMINAL HYSTERECTOMY    . CARDIAC CATHETERIZATION  2011   50% to the mid LAD lesion  . PARTIAL HYSTERECTOMY  1999  . TONSILLECTOMY         History of present illness and  Hospital Course:     Kindly see H&P for history of present illness and admission details, please review complete Labs, Consult reports and Test reports for all details in brief  HPI  from the history and physical done on the day of admission  70 year old female patient with a history of hypertension, hyperlipidemia, admitted for right upper extremity weakness, possible TIA.  Hospital Course    Weakness of right upper extremity without any further neurological deficit: Head CT unremarkable. Patient is going for MRI of the brain,  echocardiogram. Patient ldl  is  113.She is on aspirin,statins.Did not have any arrhythmias on themonitor.Continue aspirin,highintensitystatinsordischarge #2PSVT:Rate controlled.continuemetoprolol,Cardizem  #3.overactivebladder;continueoxybutynin5MG daily    possible Discharge home today. MRI of the brain results are pending  echocardiogram, carotid ultrasound are within normal limits.   Discharge Condition:   Follow UP      Discharge Instructions  and  Discharge Medications        Medication List    STOP taking these medications   diltiazem 30 MG tablet Commonly known as:  CARDIZEM   lovastatin 40 MG tablet Commonly known as:  MEVACOR Replaced by:  pravastatin 40 MG tablet     TAKE these medications   ALLERGY PO Take by mouth daily.   aspirin 81 MG tablet Take 81 mg by mouth daily.   CARTIA XT 240 MG 24 hr capsule Generic drug:  diltiazem Take 240 mg by mouth daily.   estradiol 0.5 MG tablet Commonly known as:  ESTRACE Take 0.5 mg by mouth daily.   fluocinonide cream 0.05 % Commonly known as:  LIDEX Apply 1 application topically as needed.   fluocinonide cream 0.05 % Commonly known as:  LIDEX Apply two times a day as needed   GINKOGIN PO Take by mouth daily.   lisinopril 20 MG tablet Commonly known as:  PRINIVIL,ZESTRIL Take 20 mg by mouth daily.   metoprolol succinate 100 MG 24 hr tablet Commonly known as:  TOPROL-XL Take 100 mg by mouth daily. Take with or immediately following a meal.   multivitamin tablet Take 1 tablet by mouth daily.   mupirocin ointment 2 % Commonly known as:  BACTROBAN APPLY TOPICALLY THREE TIMES DAILY  oxybutynin 5 MG tablet Commonly known as:  DITROPAN Take 5 mg by mouth daily.   pravastatin 40 MG tablet Commonly known as:  PRAVACHOL Take 1 tablet (40 mg total) by mouth daily at 6 PM. Replaces:  lovastatin 40 MG tablet   Valerian Root 100 MG Caps Take 100 mg by mouth at bedtime.   VENTOLIN HFA 108 (90  Base) MCG/ACT inhaler Generic drug:  albuterol Inhale 2 puffs into the lungs every 4 (four) hours as needed.   vitamin C 500 MG tablet Commonly known as:  ASCORBIC ACID Take 500 mg by mouth daily.   VITAMIN D PO Take by mouth daily.         Diet and Activity recommendation: See Discharge Instructions above   Consults obtained - none   Major procedures and Radiology Reports - PLEASE review detailed and final reports for all details, in brief -     Dg Chest 2 View  Result Date: 04/10/2016 CLINICAL DATA:  70 year old female with acute right upper extremity weakness, possible pneumonia EXAM: CHEST  2 VIEW COMPARISON:  Prior chest x-ray and chest CT 02/21/2015 FINDINGS: The lungs are clear and negative for focal airspace consolidation, pulmonary edema or suspicious pulmonary nodule. No pleural effusion or pneumothorax. Cardiac and mediastinal contours are within normal limits. No acute fracture or lytic or blastic osseous lesions. The visualized upper abdominal bowel gas pattern is unremarkable. Incompletely imaged anterior cervical fusion hardware. Radiopacity is projecting over the left upper quadrant may represent retained oral contrast material within colonic diverticulum. IMPRESSION: No active cardiopulmonary disease. Electronically Signed   By: Jacqulynn Cadet M.D.   On: 04/10/2016 08:29   Ct Head Wo Contrast  Result Date: 04/10/2016 CLINICAL DATA:  70 year old female with right upper extremity weakness and heaviness since 0500 hours. Initial encounter. EXAM: CT HEAD WITHOUT CONTRAST TECHNIQUE: Contiguous axial images were obtained from the base of the skull through the vertex without intravenous contrast. COMPARISON:  Cervical spine MRI 07/13/2014. FINDINGS: Brain: No midline shift, ventriculomegaly, mass effect, evidence of mass lesion, intracranial hemorrhage or evidence of cortically based acute infarction. Gray-white matter differentiation is within normal limits throughout  the brain. Cerebral volume is within normal limits for age. Vascular: No definite abnormal vessel hyperdensity. Skull: No acute osseous abnormality identified. Sinuses/Orbits: Minor ethmoid sinus mucosal thickening, otherwise clear. Other: Negative orbit and scalp soft tissues. ASPECTS [facility] Stroke Program Early CT Score) - Ganglionic level infarction (caudate, lentiform nuclei, internal capsule, insula, M1-M3 cortex): 7 - Supraganglionic infarction (M4-M6 cortex): 3 Total score (0-10 with 10 being normal): 10 IMPRESSION: 1. Normal for age non contrast CT appearance of the brain. 2. ASPECTS is 10. 3. Study discussed by telephone with Dr. Merlyn Lot on 04/10/2016 at 08:07 . Electronically Signed   By: Genevie Ann M.D.   On: 04/10/2016 08:08   US Carotid Bilateral (at [facility] Only)  Result Date: 04/10/2016 CLINICAL DATA:  70 year old female with symptoms of transient ischemic attack EXAM: BILATERAL CAROTID DUPLEX ULTRASOUND TECHNIQUE: Pearline Cables scale imaging, color Doppler and duplex ultrasound were performed of bilateral carotid and vertebral arteries in the neck. COMPARISON:  Head CT 04/10/2016 FINDINGS: Criteria: Quantification of carotid stenosis is based on velocity parameters that correlate the residual internal carotid diameter with NASCET-based stenosis levels, using the diameter of the distal internal carotid lumen as the denominator for stenosis measurement. The following velocity measurements were obtained: RIGHT ICA:  69/19 cm/sec CCA:  [id number] cm/sec SYSTOLIC ICA/CCA RATIO:  0.9 DIASTOLIC ICA/CCA RATIO:  1.2 ECA:  80 cm/sec LEFT ICA:  109/24 cm/sec CCA:  [id number] cm/sec SYSTOLIC ICA/CCA RATIO:  1.2 DIASTOLIC ICA/CCA RATIO:  2.1 ECA:  150 cm/sec RIGHT CAROTID ARTERY: Trace focal heterogeneous atherosclerotic plaque in the proximal internal carotid artery. By peak systolic velocity criteria the estimated stenosis remains less than 50%. RIGHT VERTEBRAL ARTERY:  Patent with normal antegrade flow. LEFT  CAROTID ARTERY: Mild smooth heterogeneous atherosclerotic plaque in the carotid bifurcation, largely in the origin of the external carotid artery. By peak systolic velocity criteria the estimated internal carotid stenosis remains less than 50%. LEFT VERTEBRAL ARTERY:  Patent with normal antegrade flow. IMPRESSION: 1. Mild (1-49%) stenosis proximal right internal carotid artery secondary to heterogenous atherosclerotic plaque. 2. Mild (1-49%) stenosis proximal left internal carotid artery secondary to heterogenous atherosclerotic plaque. 3. Vertebral arteries are patent with normal antegrade flow. Signed, Criselda Peaches, MD [facility] Radiology Electronically Signed   By: Jacqulynn Cadet M.D.   On: 04/10/2016 15:14    Micro Results    No results found for this or any previous visit (from the past 240 hour(s)).     Today   Subjective:   Kimberly Trevino today has no headache,no chest abdominal pain,no new weakness tingling or numbness, feels much better wants to go home today.  Objective:   Blood pressure 131/63, pulse 65, temperature 98 F (36.7 C), temperature source Oral, resp. rate 17, height 5\' 2"  (1.575 m), weight 79.4 kg (175 lb), SpO2 99 %.   Intake/Output Summary (Last 24 hours) at 04/11/16 0840 Last data filed at 04/10/16 1412  Gross per 24 hour  Intake              740 ml  Output              500 ml  Net              240 ml    Exam Awake Alert, Oriented x 3, No new F.N deficits, Normal affect [place].AT,PERRAL Supple Neck,No JVD, No cervical lymphadenopathy appriciated.  Symmetrical Chest wall movement, Good air movement bilaterally, CTAB RRR,No Gallops,Rubs or new Murmurs, No Parasternal Heave +ve B.Sounds, Abd Soft, Non tender, No organomegaly appriciated, No rebound -guarding or rigidity. No Cyanosis, Clubbing or edema, No new Rash or bruise  Data Review   CBC w Diff:  Lab Results  Component Value Date   WBC 3.6  04/10/2016   HGB 14.8 04/10/2016   HGB 15.3 05/02/2012   HCT 41.4 04/10/2016   HCT 43.8 05/02/2012   PLT 278 04/10/2016   PLT 329 05/02/2012   LYMPHOPCT 32 04/10/2016   MONOPCT 10 04/10/2016   EOSPCT 7 04/10/2016   BASOPCT 1 04/10/2016    CMP:  Lab Results  Component Value Date   NA 140 04/10/2016   NA 140 05/02/2012   K 3.6 04/10/2016   K 3.3 (L) 05/02/2012   CL 109 04/10/2016   CL 107 05/02/2012   CO2 24 04/10/2016   CO2 26 05/02/2012   BUN 15 04/10/2016   BUN 13 05/02/2012   CREATININE 0.78 04/10/2016   CREATININE 0.67 05/02/2012   PROT 7.5 04/10/2016   PROT 8.1 05/02/2012   ALBUMIN 4.4 04/10/2016   ALBUMIN 4.1 05/02/2012   BILITOT 0.7 04/10/2016   BILITOT 0.5 05/02/2012   ALKPHOS 64 04/10/2016   ALKPHOS 74 05/02/2012   AST 31 04/10/2016   AST 25 05/02/2012   ALT 25 04/10/2016   ALT 31 05/02/2012  .  Total Time in preparing paper work, data evaluation and todays exam - 50 minutes  Jeriko Kowalke M.D on 04/11/2016 at 8:40 AM    Note: This dictation was prepared with Dragon dictation along with smaller phrase technology. Any transcriptional errors that result from this process are unintentional.

## 2016-04-11 NOTE — Progress Notes (Signed)
Discharge cancelled because of acute left frontal stroke.neuro recommends plavix.possible d/c hpome am with holter and plavix.

## 2016-04-11 NOTE — Consult Note (Signed)
Referring Physician: Vianne Bulls    Chief Complaint: Right upper extremity weakness  HPI: Kimberly Trevino is an 70 y.o. female who reports awakening on yesterday and noting RUE weakness and numbness.  She could not lift her arm or hold things.  There was some improvement in her symptoms but due to their severity she presented for evaluation.  After presentation symptoms resolved almost completely.  Initial NIHSS of 1.    Date last known well: Date: 04/09/2016 Time last known well: Time: 21:00 tPA Given: No: Outside time window, improvement in symptoms  Past Medical History:  Diagnosis Date  . Coronary artery disease 2011   Cardiac cath in 2011 at [facility]: 50% mid LAD stenosis without evidence of obstructive disease. Normal ejection fraction. Nuclear stress test done in October of 2013 4 atypical chest pain was normal.  . Hyperlipidemia   . Hypertension   . PSVT (paroxysmal supraventricular tachycardia) ([place])   . Tachycardia 2010    Past Surgical History:  Procedure Laterality Date  . ABDOMINAL HYSTERECTOMY    . CARDIAC CATHETERIZATION  2011   50% to the mid LAD lesion  . PARTIAL HYSTERECTOMY  1999  . TONSILLECTOMY      Family History  Problem Relation Age of Onset  . Hypertension Mother   . Heart attack Father 81    MI  . Heart disease Father   . Hypertension Father   . Hypertension Sister   . Hyperlipidemia Sister   . Heart attack Brother     cardiac arrest  . Hypertension Sister   . Hyperlipidemia Sister   . Breast cancer Neg Hx    Social History:  reports that she has never smoked. She has never used smokeless tobacco. She reports that she does not drink alcohol or use drugs.  Allergies:  Allergies  Allergen Reactions  . Sulfa Antibiotics     Medications:  I have reviewed the patient's current medications. Prior to Admission:  Prescriptions Prior to Admission  Medication Sig Dispense Refill Last Dose  . aspirin 81 MG tablet Take 81 mg by mouth daily.    04/09/2016 at 0900  . CARTIA XT 240 MG 24 hr capsule Take 240 mg by mouth daily.   04/09/2016 at 0900  . Chlorpheniramine Maleate (ALLERGY PO) Take by mouth daily.   04/09/2016 at 0900  . Cholecalciferol (VITAMIN D PO) Take by mouth daily.   04/09/2016 at 0900  . estradiol (ESTRACE) 0.5 MG tablet Take 0.5 mg by mouth daily.   04/09/2016 at 0900  . fluocinonide cream (LIDEX) [id number] % Apply 1 application topically as needed.    04/09/2016 at 0900  . fluocinonide cream (LIDEX) 0.05 % Apply two times a day as needed   prn at prn  . lisinopril (PRINIVIL,ZESTRIL) 20 MG tablet Take 20 mg by mouth daily.   04/09/2016 at 0900  . lovastatin (MEVACOR) 40 MG tablet Take 40 mg by mouth at bedtime.   04/09/2016 at 2100  . metoprolol succinate (TOPROL-XL) 100 MG 24 hr tablet Take 100 mg by mouth daily. Take with or immediately following a meal.   04/09/2016 at 2100  . Misc Natural Products (GINKOGIN PO) Take by mouth daily.   04/09/2016 at 0900  . Multiple Vitamin (MULTIVITAMIN) tablet Take 1 tablet by mouth daily.   04/09/2016 at 0900  . mupirocin ointment (BACTROBAN) 2 % APPLY TOPICALLY THREE TIMES DAILY   04/09/2016 at 2100  . oxybutynin (DITROPAN) 5 MG tablet Take 5 mg by mouth daily.  04/09/2016 at 0900  . Valerian Root 100 MG CAPS Take 100 mg by mouth at bedtime.   Past Month at 2100  . vitamin C (ASCORBIC ACID) 500 MG tablet Take 500 mg by mouth daily.   04/09/2016 at 0900  . diltiazem (CARDIZEM) 30 MG tablet Take 30 mg by mouth 4 (four) times daily as needed.   prn at prn  . VENTOLIN HFA 108 (90 BASE) MCG/ACT inhaler Inhale 2 puffs into the lungs every 4 (four) hours as needed.    prn at prn   Scheduled: . aspirin EC  81 mg Oral Daily  . diltiazem  240 mg Oral Daily  . enoxaparin (LOVENOX) injection  40 mg Subcutaneous Q24H  . estradiol  0.5 mg Oral Daily  . lisinopril  20 mg Oral Daily  . metoprolol succinate  100 mg Oral Daily  . multivitamin with minerals  1 tablet Oral Daily  . oxybutynin  5  mg Oral Daily  . pravastatin  40 mg Oral q1800    ROS: History obtained from the patient  General ROS: negative for - chills, fatigue, fever, night sweats, weight gain or weight loss Psychological ROS: negative for - behavioral disorder, hallucinations, memory difficulties, mood swings or suicidal ideation Ophthalmic ROS: negative for - blurry vision, double vision, eye pain or loss of vision ENT ROS: negative for - epistaxis, nasal discharge, oral lesions, sore throat, tinnitus or vertigo Allergy and Immunology ROS: negative for - hives or itchy/watery eyes Hematological and Lymphatic ROS: negative for - bleeding problems, bruising or swollen lymph nodes Endocrine ROS: negative for - galactorrhea, hair pattern changes, polydipsia/polyuria or temperature intolerance Respiratory ROS: negative for - cough, hemoptysis, shortness of breath or wheezing Cardiovascular ROS: negative for - chest pain, dyspnea on exertion, edema or irregular heartbeat Gastrointestinal ROS: negative for - abdominal pain, diarrhea, hematemesis, nausea/vomiting or stool incontinence Genito-Urinary ROS: negative for - dysuria, hematuria, incontinence or urinary frequency/urgency Musculoskeletal ROS: negative for - joint swelling or muscular weakness Neurological ROS: as noted in HPI Dermatological ROS: negative for rash and skin lesion changes  Physical Examination: Blood pressure (!) 134/59, pulse (!) 59, temperature 97.5 F (36.4 C), temperature source Oral, resp. rate 18, height 5\' 2"  (1.575 m), weight 79.4 kg (175 lb), SpO2 98 %.  HEENT-  Normocephalic, no lesions, without obvious abnormality.  Normal external eye and conjunctiva.  Normal TM's bilaterally.  Normal auditory canals and external ears. Normal external nose, mucus membranes and septum.  Normal pharynx. Cardiovascular- S1, S2 normal, pulses palpable throughout   Lungs- chest clear, no wheezing, rales, normal symmetric air entry Abdomen- soft,  non-tender; bowel sounds normal; no masses,  no organomegaly Extremities- no edema Lymph-no adenopathy palpable Musculoskeletal-no joint tenderness, deformity or swelling Skin-warm and dry, no hyperpigmentation, vitiligo, or suspicious lesions  Neurological Examination Mental Status: Alert, oriented, thought content appropriate.  Speech fluent without evidence of aphasia.  Able to follow 3 step commands without difficulty. Cranial Nerves: II: Discs flat bilaterally; Visual fields grossly normal, pupils equal, round, reactive to light and accommodation III,IV, VI: ptosis not present, extra-ocular motions intact bilaterally V,VII: smile symmetric, facial light touch sensation normal bilaterally VIII: hearing normal bilaterally IX,X: gag reflex present XI: bilateral shoulder shrug XII: midline tongue extension Motor: Right : Upper extremity   5-/5    Left:     Upper extremity   5/5  Lower extremity   5/5     Lower extremity   5/5 Tone and bulk:normal tone throughout; no atrophy  noted Sensory: Pinprick and light touch intact throughout, bilaterally Deep Tendon Reflexes: 2+ and symmetric throughout Plantars: Right: equivocal   Left: downgoing Cerebellar: Normal finger-to-nose and normal heel-to-shin testing bilaterally Gait: not tested due to safety concerns    Laboratory Studies:  Basic Metabolic Panel:  Recent Labs Lab 04/10/16 0746  NA 140  K 3.6  CL 109  CO2 24  GLUCOSE 144*  BUN 15  CREATININE 0.78  CALCIUM 9.5  MG 2.0    Liver Function Tests:  Recent Labs Lab 04/10/16 0746  AST 31  ALT 25  ALKPHOS 64  BILITOT 0.7  PROT 7.5  ALBUMIN 4.4   No results for input(s): LIPASE, AMYLASE in the last 168 hours. No results for input(s): AMMONIA in the last 168 hours.  CBC:  Recent Labs Lab 04/10/16 0746  WBC 3.6  NEUTROABS 1.8  HGB 14.8  HCT 41.4  MCV 83.7  PLT 278    Cardiac Enzymes:  Recent Labs Lab 04/10/16 0746  TROPONINI <0.03     BNP: Invalid input(s): POCBNP  CBG: No results for input(s): GLUCAP in the last 168 hours.  Microbiology: No results found for this or any previous visit.  Coagulation Studies:  Recent Labs  04/10/16 0746  LABPROT 12.3  INR 0.92    Urinalysis:  Recent Labs Lab 04/10/16 0924  COLORURINE COLORLESS*  LABSPEC 1.003*  PHURINE 7.0  GLUCOSEU NEGATIVE  HGBUR NEGATIVE  BILIRUBINUR NEGATIVE  KETONESUR NEGATIVE  PROTEINUR NEGATIVE  NITRITE NEGATIVE  LEUKOCYTESUR NEGATIVE    Lipid Panel:    Component Value Date/Time   CHOL 208 (H) 04/11/2016 0449   TRIG 294 (H) 04/11/2016 0449   HDL 36 (L) 04/11/2016 0449   CHOLHDL 5.8 04/11/2016 0449   VLDL 59 (H) 04/11/2016 0449   LDLCALC 113 (H) 04/11/2016 0449    HgbA1C: No results found for: HGBA1C  Urine Drug Screen:  No results found for: LABOPIA, [place], [place], [place], THCU, LABBARB  Alcohol Level:  Recent Labs Lab 04/10/16 [place] <5    Other results: EKG:  (03/15/2016) normal sinus rhythm at 65 bpm.  Imaging: Dg Chest 2 View  Result Date: 04/10/2016 CLINICAL DATA:  70 year old female with acute right upper extremity weakness, possible pneumonia EXAM: CHEST  2 VIEW COMPARISON:  Prior chest x-ray and chest CT 02/21/2015 FINDINGS: The lungs are clear and negative for focal airspace consolidation, pulmonary edema or suspicious pulmonary nodule. No pleural effusion or pneumothorax. Cardiac and mediastinal contours are within normal limits. No acute fracture or lytic or blastic osseous lesions. The visualized upper abdominal bowel gas pattern is unremarkable. Incompletely imaged anterior cervical fusion hardware. Radiopacity is projecting over the left upper quadrant may represent retained oral contrast material within colonic diverticulum. IMPRESSION: No active cardiopulmonary disease. Electronically Signed   By: Jacqulynn Cadet M.D.   On: 04/10/2016 08:29   Ct Head Wo Contrast  Result Date:  04/10/2016 CLINICAL DATA:  70 year old female with right upper extremity weakness and heaviness since 0500 hours. Initial encounter. EXAM: CT HEAD WITHOUT CONTRAST TECHNIQUE: Contiguous axial images were obtained from the base of the skull through the vertex without intravenous contrast. COMPARISON:  Cervical spine MRI 07/13/2014. FINDINGS: Brain: No midline shift, ventriculomegaly, mass effect, evidence of mass lesion, intracranial hemorrhage or evidence of cortically based acute infarction. Gray-white matter differentiation is within normal limits throughout the brain. Cerebral volume is within normal limits for age. Vascular: No definite abnormal vessel hyperdensity. Skull: No acute osseous abnormality identified. Sinuses/Orbits: Minor ethmoid sinus  mucosal thickening, otherwise clear. Other: Negative orbit and scalp soft tissues. ASPECTS [facility] Stroke Program Early CT Score) - Ganglionic level infarction (caudate, lentiform nuclei, internal capsule, insula, M1-M3 cortex): 7 - Supraganglionic infarction (M4-M6 cortex): 3 Total score (0-10 with 10 being normal): 10 IMPRESSION: 1. Normal for age non contrast CT appearance of the brain. 2. ASPECTS is 10. 3. Study discussed by telephone with Dr. Merlyn Lot on 04/10/2016 at 08:07 . Electronically Signed   By: Genevie Ann M.D.   On: 04/10/2016 08:08   Mr Brain Wo Contrast  Result Date: 04/11/2016 CLINICAL DATA:  Right arm weakness. EXAM: MRI HEAD WITHOUT CONTRAST MRA HEAD WITHOUT CONTRAST TECHNIQUE: Multiplanar, multiecho pulse sequences of the brain and surrounding structures were obtained without intravenous contrast. Angiographic images of the head were obtained using MRA technique without contrast. COMPARISON:  Head CT 04/10/2016 FINDINGS: MRI HEAD FINDINGS Brain: There are small foci of acute cortical infarction involving the posterior left frontal lobe (including precentral gyrus) and white matter of the centrum semiovale in the MCA territory. There is  no evidence intracranial hemorrhage, mass, midline shift, or extra-axial fluid collection. The ventricles and sulci are normal. No significant chronic ischemic changes are evident. Vascular: Major intracranial vascular flow voids are preserved. Skull and upper cervical spine: No focal marrow lesion. Prior anterior cervical fusion. Sinuses/Orbits: Unremarkable orbits. Near complete opacification of the left maxillary sinus by fluid. Mild paranasal sinus mucosal thickening elsewhere. Clear mastoid air cells. Other: None. MRA HEAD FINDINGS There is mild motion artifact. The visualized distal vertebral arteries are patent with the right being dominant. Focal loss of signal in the left vertebral artery in the proximal V4 segment may reflect stenosis or artifact. PICA and SCA origins are patent. Basilar artery is patent without stenosis. There are fetal type origins of both PCAs. PCAs are patent without evidence of significant stenosis. The internal carotid arteries are widely patent from skullbase to carotid termini. ACAs and MCAs are patent without evidence of major branch occlusion or significant stenosis. The left A1 segment is mildly dominant. There is a patent anterior communicating artery. No intracranial aneurysm is identified. IMPRESSION: 1. 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 2. No major intracranial arterial occlusion or significant anterior circulation stenosis. 3. Artifact versus stenosis in the left V4 vertebral artery. Electronically Signed   By: Logan Bores M.D.   On: 04/11/2016 10:05   US Carotid Bilateral (at [facility] Only)  Result Date: 04/10/2016 CLINICAL DATA:  71 year old female with symptoms of transient ischemic attack EXAM: BILATERAL CAROTID DUPLEX ULTRASOUND TECHNIQUE: Pearline Cables scale imaging, color Doppler and duplex ultrasound were performed of bilateral carotid and vertebral arteries in the neck. COMPARISON:  Head CT 04/10/2016 FINDINGS: Criteria: Quantification of carotid stenosis  is based on velocity parameters that correlate the residual internal carotid diameter with NASCET-based stenosis levels, using the diameter of the distal internal carotid lumen as the denominator for stenosis measurement. The following velocity measurements were obtained: RIGHT ICA:  69/19 cm/sec CCA:  [id number] cm/sec SYSTOLIC ICA/CCA RATIO:  0.9 DIASTOLIC ICA/CCA RATIO:  1.2 ECA:  80 cm/sec LEFT ICA:  109/24 cm/sec CCA:  [id number] cm/sec SYSTOLIC ICA/CCA RATIO:  1.2 DIASTOLIC ICA/CCA RATIO:  2.1 ECA:  150 cm/sec RIGHT CAROTID ARTERY: Trace focal heterogeneous atherosclerotic plaque in the proximal internal carotid artery. By peak systolic velocity criteria the estimated stenosis remains less than 50%. RIGHT VERTEBRAL ARTERY:  Patent with normal antegrade flow. LEFT CAROTID ARTERY: Mild smooth heterogeneous atherosclerotic plaque in the carotid  bifurcation, largely in the origin of the external carotid artery. By peak systolic velocity criteria the estimated internal carotid stenosis remains less than 50%. LEFT VERTEBRAL ARTERY:  Patent with normal antegrade flow. IMPRESSION: 1. Mild (1-49%) stenosis proximal right internal carotid artery secondary to heterogenous atherosclerotic plaque. 2. Mild (1-49%) stenosis proximal left internal carotid artery secondary to heterogenous atherosclerotic plaque. 3. Vertebral arteries are patent with normal antegrade flow. Signed, Criselda Peaches, MD [facility] Electronically Signed   By: Jacqulynn Cadet M.D.   On: 04/10/2016 15:14   Mr Jodene Nam Head/brain [id number] Cm  Result Date: 04/11/2016 CLINICAL DATA:  Right arm weakness. EXAM: MRI HEAD WITHOUT CONTRAST MRA HEAD WITHOUT CONTRAST TECHNIQUE: Multiplanar, multiecho pulse sequences of the brain and surrounding structures were obtained without intravenous contrast. Angiographic images of the head were obtained using MRA technique without contrast. COMPARISON:  Head CT  04/10/2016 FINDINGS: MRI HEAD FINDINGS Brain: There are small foci of acute cortical infarction involving the posterior left frontal lobe (including precentral gyrus) and white matter of the centrum semiovale in the MCA territory. There is no evidence intracranial hemorrhage, mass, midline shift, or extra-axial fluid collection. The ventricles and sulci are normal. No significant chronic ischemic changes are evident. Vascular: Major intracranial vascular flow voids are preserved. Skull and upper cervical spine: No focal marrow lesion. Prior anterior cervical fusion. Sinuses/Orbits: Unremarkable orbits. Near complete opacification of the left maxillary sinus by fluid. Mild paranasal sinus mucosal thickening elsewhere. Clear mastoid air cells. Other: None. MRA HEAD FINDINGS There is mild motion artifact. The visualized distal vertebral arteries are patent with the right being dominant. Focal loss of signal in the left vertebral artery in the proximal V4 segment may reflect stenosis or artifact. PICA and SCA origins are patent. Basilar artery is patent without stenosis. There are fetal type origins of both PCAs. PCAs are patent without evidence of significant stenosis. The internal carotid arteries are widely patent from skullbase to carotid termini. ACAs and MCAs are patent without evidence of major branch occlusion or significant stenosis. The left A1 segment is mildly dominant. There is a patent anterior communicating artery. No intracranial aneurysm is identified. IMPRESSION: 1. 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 2. No major intracranial arterial occlusion or significant anterior circulation stenosis. 3. Artifact versus stenosis in the left V4 vertebral artery. Electronically Signed   By: Logan Bores M.D.   On: 04/11/2016 10:05    Assessment: 70 y.o. female who presents with right upper extremity weakness that improved.  Although patient reports that she is back to baseline she does have some continued  RUE weakness that is mild.  MRI of the brain shows small acute left frontal infarcts.  Concern is for an embolic etiology.  Carotid dopplers show no evidence of hemodynamically significant stenosis.  Echocardiogram shows no cardiac source of emboli with an EF of 60%.  A1c pending, LDL 113.  Patient on statins and ASA at home.    Stroke Risk Factors - hyperlipidemia and hypertension  Plan: 1.  PT consult, OT consult, Speech consult 2.  Prophylactic therapy-Antiplatelet med: Plavix - dose 75mg  daily 3. Telemetry monitoring while hospitalized.  If unremarkable would consider holter as an outpatient.  Patient sees Dr. Fletcher Anon.   4. Frequent neuro checks 5. Statin adjustment for aggressive lipid management with target LDL<70.   Alexis Goodell, MD Neurology [phone] 04/11/2016, 2:49 PM

## 2016-04-12 LAB — HEMOGLOBIN A1C
Hgb A1c MFr Bld: 6 % — ABNORMAL HIGH (ref 4.8–5.6)
MEAN PLASMA GLUCOSE: 126 mg/dL

## 2016-04-12 MED ORDER — ROSUVASTATIN CALCIUM 5 MG PO TABS: 5.0000 mg | ORAL_TABLET | Freq: Every day | ORAL | 0 refills | Status: DC

## 2016-04-12 MED ORDER — CLOPIDOGREL BISULFATE 75 MG PO TABS: 75.0000 mg | ORAL_TABLET | Freq: Every day | ORAL | 0 refills | Status: DC

## 2016-04-12 NOTE — Care Management Important Message (Signed)
Important Message  Patient Details  Name: Kimberly Trevino MRN: [id number] Date of Birth: Feb 09, 1946   Medicare Important Message Given:  Yes    Shelbie Ammons, RN 04/12/2016, 11:20 AM

## 2016-04-12 NOTE — Discharge Summary (Signed)
Kimberly Trevino, is a 70 y.o. female  DOB [birth year]  MRN [id number].  Admission date:  04/10/2016  Admitting Physician  Dustin Flock, MD  Discharge Date:  04/12/2016   Primary MD  Annice Needy, MD  Recommendations for primary care physician for things to follow:   Follow Up with primary doctor in 1 week Follow up with DR.Arida in one week for out pt holter..   Admission Diagnosis  RUE weakness [R29.898] Transient cerebral ischemia, unspecified type [G45.9]   Discharge Diagnosis  RUE weakness [R29.898] Transient cerebral ischemia, unspecified type [G45.9]    Active Problems:   TIA (transient ischemic attack)   CVA (cerebral vascular accident) [facility])      Past Medical History:  Diagnosis Date  . Coronary artery disease 2011   Cardiac cath in 2011 at [facility]: 50% mid LAD stenosis without evidence of obstructive disease. Normal ejection fraction. Nuclear stress test done in October of 2013 4 atypical chest pain was normal.  . Hyperlipidemia   . Hypertension   . PSVT (paroxysmal supraventricular tachycardia) ([place])   . Tachycardia 2010    Past Surgical History:  Procedure Laterality Date  . ABDOMINAL HYSTERECTOMY    . CARDIAC CATHETERIZATION  2011   50% to the mid LAD lesion  . PARTIAL HYSTERECTOMY  1999  . TONSILLECTOMY         History of present illness and  Hospital Course:     Kindly see H&P for history of present illness and admission details, please review complete Labs, Consult reports and Test reports for all details in brief  HPI  from the history and physical done on the day of admission  70 year old female patient with a history of hypertension, hyperlipidemia, admitted for right upper extremity weakness, possible TIA.  Hospital Course   #1 acute left frontal stroke: Likely embolic: EF  123456, LDL 123456, ultrasound carotids did not show any significant stenosis. Seen by Neurology. Physical therapy did not recommend any outpatient physical therapy. No arrhythmias on telemetry. Patient follows up with Dr. Arville Care to follow up with him for outpatient Holter. Started on Plavix., High intensity statins.   #3.overactivebladder;continueoxybutynin5MG daily    d/w family. Discharge Condition:   Follow UP  Follow-up Information    [name], MD Follow up in 1 week(s).   Specialty:  Cardiology Why:  needs holter ,admitted for acute stroke,and need to see if she has paroxysmal afib. Contact information: [address] [place] [address] [phone]             Discharge Instructions  and  Discharge Medications        Medication List    STOP taking these medications   aspirin 81 MG tablet   diltiazem 30 MG tablet Commonly known as:  CARDIZEM   lovastatin 40 MG tablet Commonly known as:  MEVACOR     TAKE these medications   ALLERGY PO Take by mouth daily.   CARTIA XT 240 MG 24 hr capsule Generic drug:  diltiazem Take 240 mg by mouth daily.   clopidogrel 75 MG tablet Commonly known as:  PLAVIX Take 1 tablet (75 mg total) by mouth daily. Start taking on:  04/13/2016   estradiol 0.5 MG tablet Commonly known as:  ESTRACE Take 0.5 mg by mouth daily.   fluocinonide cream 0.05 % Commonly known as:  LIDEX Apply 1 application topically as needed.   fluocinonide cream 0.05 % Commonly known as:  LIDEX Apply two times a day as needed  GINKOGIN PO Take by mouth daily.   lisinopril 20 MG tablet Commonly known as:  PRINIVIL,ZESTRIL Take 20 mg by mouth daily.   metoprolol succinate 100 MG 24 hr tablet Commonly known as:  TOPROL-XL Take 100 mg by mouth daily. Take with or immediately following a meal.   multivitamin tablet Take 1 tablet by mouth daily.   mupirocin ointment 2 % Commonly known as:  BACTROBAN APPLY  TOPICALLY THREE TIMES DAILY   oxybutynin 5 MG tablet Commonly known as:  DITROPAN Take 5 mg by mouth daily.   rosuvastatin 5 MG tablet Commonly known as:  CRESTOR Take 1 tablet (5 mg total) by mouth daily at 6 PM.   Valerian Root 100 MG Caps Take 100 mg by mouth at bedtime.   VENTOLIN HFA 108 (90 Base) MCG/ACT inhaler Generic drug:  albuterol Inhale 2 puffs into the lungs every 4 (four) hours as needed.   vitamin C 500 MG tablet Commonly known as:  ASCORBIC ACID Take 500 mg by mouth daily.   VITAMIN D PO Take by mouth daily.         Diet and Activity recommendation: See Discharge Instructions above   Consults obtained - none   Major procedures and Radiology Reports - PLEASE review detailed and final reports for all details, in brief -     Dg Chest 2 View  Result Date: 04/10/2016 CLINICAL DATA:  70 year old female with acute right upper extremity weakness, possible pneumonia EXAM: CHEST  2 VIEW COMPARISON:  Prior chest x-ray and chest CT 02/21/2015 FINDINGS: The lungs are clear and negative for focal airspace consolidation, pulmonary edema or suspicious pulmonary nodule. No pleural effusion or pneumothorax. Cardiac and mediastinal contours are within normal limits. No acute fracture or lytic or blastic osseous lesions. The visualized upper abdominal bowel gas pattern is unremarkable. Incompletely imaged anterior cervical fusion hardware. Radiopacity is projecting over the left upper quadrant may represent retained oral contrast material within colonic diverticulum. IMPRESSION: No active cardiopulmonary disease. Electronically Signed   By: Jacqulynn Cadet M.D.   On: 04/10/2016 08:29   Ct Head Wo Contrast  Result Date: 04/10/2016 CLINICAL DATA:  70 year old female with right upper extremity weakness and heaviness since 0500 hours. Initial encounter. EXAM: CT HEAD WITHOUT CONTRAST TECHNIQUE: Contiguous axial images were obtained from the base of the skull through the  vertex without intravenous contrast. COMPARISON:  Cervical spine MRI 07/13/2014. FINDINGS: Brain: No midline shift, ventriculomegaly, mass effect, evidence of mass lesion, intracranial hemorrhage or evidence of cortically based acute infarction. Gray-white matter differentiation is within normal limits throughout the brain. Cerebral volume is within normal limits for age. Vascular: No definite abnormal vessel hyperdensity. Skull: No acute osseous abnormality identified. Sinuses/Orbits: Minor ethmoid sinus mucosal thickening, otherwise clear. Other: Negative orbit and scalp soft tissues. ASPECTS [facility] Stroke Program Early CT Score) - Ganglionic level infarction (caudate, lentiform nuclei, internal capsule, insula, M1-M3 cortex): 7 - Supraganglionic infarction (M4-M6 cortex): 3 Total score (0-10 with 10 being normal): 10 IMPRESSION: 1. Normal for age non contrast CT appearance of the brain. 2. ASPECTS is 10. 3. Study discussed by telephone with Dr. Merlyn Lot on 04/10/2016 at 08:07 . Electronically Signed   By: Genevie Ann M.D.   On: 04/10/2016 08:08   Mr Brain Wo Contrast  Result Date: 04/11/2016 CLINICAL DATA:  Right arm weakness. EXAM: MRI HEAD WITHOUT CONTRAST MRA HEAD WITHOUT CONTRAST TECHNIQUE: Multiplanar, multiecho pulse sequences of the brain and surrounding structures were obtained without intravenous contrast. Angiographic images of  the head were obtained using MRA technique without contrast. COMPARISON:  Head CT 04/10/2016 FINDINGS: MRI HEAD FINDINGS Brain: There are small foci of acute cortical infarction involving the posterior left frontal lobe (including precentral gyrus) and white matter of the centrum semiovale in the MCA territory. There is no evidence intracranial hemorrhage, mass, midline shift, or extra-axial fluid collection. The ventricles and sulci are normal. No significant chronic ischemic changes are evident. Vascular: Major intracranial vascular flow voids are preserved. Skull  and upper cervical spine: No focal marrow lesion. Prior anterior cervical fusion. Sinuses/Orbits: Unremarkable orbits. Near complete opacification of the left maxillary sinus by fluid. Mild paranasal sinus mucosal thickening elsewhere. Clear mastoid air cells. Other: None. MRA HEAD FINDINGS There is mild motion artifact. The visualized distal vertebral arteries are patent with the right being dominant. Focal loss of signal in the left vertebral artery in the proximal V4 segment may reflect stenosis or artifact. PICA and SCA origins are patent. Basilar artery is patent without stenosis. There are fetal type origins of both PCAs. PCAs are patent without evidence of significant stenosis. The internal carotid arteries are widely patent from skullbase to carotid termini. ACAs and MCAs are patent without evidence of major branch occlusion or significant stenosis. The left A1 segment is mildly dominant. There is a patent anterior communicating artery. No intracranial aneurysm is identified. IMPRESSION: 1. 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 2. No major intracranial arterial occlusion or significant anterior circulation stenosis. 3. Artifact versus stenosis in the left V4 vertebral artery. Electronically Signed   By: Logan Bores M.D.   On: 04/11/2016 10:05   US Carotid Bilateral (at [facility] And [facility] Only)  Result Date: 04/10/2016 CLINICAL DATA:  70 year old female with symptoms of transient ischemic attack EXAM: BILATERAL CAROTID DUPLEX ULTRASOUND TECHNIQUE: Pearline Cables scale imaging, color Doppler and duplex ultrasound were performed of bilateral carotid and vertebral arteries in the neck. COMPARISON:  Head CT 04/10/2016 FINDINGS: Criteria: Quantification of carotid stenosis is based on velocity parameters that correlate the residual internal carotid diameter with NASCET-based stenosis levels, using the diameter of the distal internal carotid lumen as the denominator for stenosis measurement. The following velocity  measurements were obtained: RIGHT ICA:  69/19 cm/sec CCA:  [id number] cm/sec SYSTOLIC ICA/CCA RATIO:  0.9 DIASTOLIC ICA/CCA RATIO:  1.2 ECA:  80 cm/sec LEFT ICA:  109/24 cm/sec CCA:  [id number] cm/sec SYSTOLIC ICA/CCA RATIO:  1.2 DIASTOLIC ICA/CCA RATIO:  2.1 ECA:  150 cm/sec RIGHT CAROTID ARTERY: Trace focal heterogeneous atherosclerotic plaque in the proximal internal carotid artery. By peak systolic velocity criteria the estimated stenosis remains less than 50%. RIGHT VERTEBRAL ARTERY:  Patent with normal antegrade flow. LEFT CAROTID ARTERY: Mild smooth heterogeneous atherosclerotic plaque in the carotid bifurcation, largely in the origin of the external carotid artery. By peak systolic velocity criteria the estimated internal carotid stenosis remains less than 50%. LEFT VERTEBRAL ARTERY:  Patent with normal antegrade flow. IMPRESSION: 1. Mild (1-49%) stenosis proximal right internal carotid artery secondary to heterogenous atherosclerotic plaque. 2. Mild (1-49%) stenosis proximal left internal carotid artery secondary to heterogenous atherosclerotic plaque. 3. Vertebral arteries are patent with normal antegrade flow. Signed, Criselda Peaches, MD [facility] Electronically Signed   By: Jacqulynn Cadet M.D.   On: 04/10/2016 15:14   Mr Jodene Nam Head/brain [id number] Cm  Result Date: 04/11/2016 CLINICAL DATA:  Right arm weakness. EXAM: MRI HEAD WITHOUT CONTRAST MRA HEAD WITHOUT CONTRAST TECHNIQUE: Multiplanar, multiecho pulse sequences of the brain and surrounding  structures were obtained without intravenous contrast. Angiographic images of the head were obtained using MRA technique without contrast. COMPARISON:  Head CT 04/10/2016 FINDINGS: MRI HEAD FINDINGS Brain: There are small foci of acute cortical infarction involving the posterior left frontal lobe (including precentral gyrus) and white matter of the centrum semiovale in the MCA territory. There is no evidence  intracranial hemorrhage, mass, midline shift, or extra-axial fluid collection. The ventricles and sulci are normal. No significant chronic ischemic changes are evident. Vascular: Major intracranial vascular flow voids are preserved. Skull and upper cervical spine: No focal marrow lesion. Prior anterior cervical fusion. Sinuses/Orbits: Unremarkable orbits. Near complete opacification of the left maxillary sinus by fluid. Mild paranasal sinus mucosal thickening elsewhere. Clear mastoid air cells. Other: None. MRA HEAD FINDINGS There is mild motion artifact. The visualized distal vertebral arteries are patent with the right being dominant. Focal loss of signal in the left vertebral artery in the proximal V4 segment may reflect stenosis or artifact. PICA and SCA origins are patent. Basilar artery is patent without stenosis. There are fetal type origins of both PCAs. PCAs are patent without evidence of significant stenosis. The internal carotid arteries are widely patent from skullbase to carotid termini. ACAs and MCAs are patent without evidence of major branch occlusion or significant stenosis. The left A1 segment is mildly dominant. There is a patent anterior communicating artery. No intracranial aneurysm is identified. IMPRESSION: 1. 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 2. No major intracranial arterial occlusion or significant anterior circulation stenosis. 3. Artifact versus stenosis in the left V4 vertebral artery. Electronically Signed   By: Logan Bores M.D.   On: 04/11/2016 10:05    Micro Results    No results found for this or any previous visit (from the past 240 hour(s)).     Today   Subjective:   Kimberly Trevino today has no headache,no chest abdominal pain,no new weakness tingling or numbness, feels much better wants to go home today.  Objective:   Blood pressure 134/67, pulse 74, temperature 97.8 F (36.6 C), temperature source Oral, resp. rate 16, height 5\' 2"  (1.575 m), weight  79.4 kg (175 lb), SpO2 100 %.   Intake/Output Summary (Last 24 hours) at 04/12/16 1401 Last data filed at 04/12/16 1004  Gross per 24 hour  Intake              360 ml  Output                0 ml  Net              360 ml    Exam Awake Alert, Oriented x 3, No new F.N deficits, Normal affect [place].AT,PERRAL Supple Neck,No JVD, No cervical lymphadenopathy appriciated.  Symmetrical Chest wall movement, Good air movement bilaterally, CTAB RRR,No Gallops,Rubs or new Murmurs, No Parasternal Heave +ve B.Sounds, Abd Soft, Non tender, No organomegaly appriciated, No rebound -guarding or rigidity. No Cyanosis, Clubbing or edema, No new Rash or bruise  Data Review   CBC w Diff:  Lab Results  Component Value Date   WBC 3.6 04/10/2016   HGB 14.8 04/10/2016   HGB 15.3 05/02/2012   HCT 41.4 04/10/2016   HCT 43.8 05/02/2012   PLT 278 04/10/2016   PLT 329 05/02/2012   LYMPHOPCT 32 04/10/2016   MONOPCT 10 04/10/2016   EOSPCT 7 04/10/2016   BASOPCT 1 04/10/2016    CMP:  Lab Results  Component Value Date   NA 140 04/10/2016  NA 140 05/02/2012   K 3.6 04/10/2016   K 3.3 (L) 05/02/2012   CL 109 04/10/2016   CL 107 05/02/2012   CO2 24 04/10/2016   CO2 26 05/02/2012   BUN 15 04/10/2016   BUN 13 05/02/2012   CREATININE 0.78 04/10/2016   CREATININE 0.67 05/02/2012   PROT 7.5 04/10/2016   PROT 8.1 05/02/2012   ALBUMIN 4.4 04/10/2016   ALBUMIN 4.1 05/02/2012   BILITOT 0.7 04/10/2016   BILITOT 0.5 05/02/2012   ALKPHOS 64 04/10/2016   ALKPHOS 74 05/02/2012   AST 31 04/10/2016   AST 25 05/02/2012   ALT 25 04/10/2016   ALT 31 05/02/2012  .   Total Time in preparing paper work, data evaluation and todays exam - 85 minutes  Tayllor Breitenstein M.D on 04/12/2016 at 2:01 PM    Note: This dictation was prepared with Dragon dictation along with smaller phrase technology. Any transcriptional errors that result from this process are unintentional.

## 2016-04-12 NOTE — Progress Notes (Signed)
     Kimberly Trevino was admitted to the Hospital on 04/10/2016 and Discharged  04/12/2016 and should be excused from work/school   for 8  days starting 04/10/2016 , may return to work/school without any restrictions.    Epifanio Lesches M.D on 04/12/2016,at 9:41 AM

## 2016-04-12 NOTE — Progress Notes (Signed)
Discharge paperwork reviewed with patient who verbalized understanding. New prescriptions sent electronically to [facility]; medication changes reviewed with patient who verbalized understanding of changes. Patient is stable & ready for discharge. VSS. Patient's son to transport home.

## 2016-04-12 NOTE — Plan of Care (Signed)
Problem: Tissue Perfusion: Goal: Complications of Ischemic Stroke will be minimized (choose ONE based on patient diagnosis) Outcome: Progressing Pt NIH 0. No complaints of pain. Up as tolerate. Spouse at bedside. Possible discharge home today.

## 2016-04-18 ENCOUNTER — Ambulatory Visit (INDEPENDENT_AMBULATORY_CARE_PROVIDER_SITE_OTHER): Payer: Medicare Other | Admitting: Physician Assistant

## 2016-04-18 ENCOUNTER — Encounter: Payer: Self-pay | Admitting: Physician Assistant

## 2016-04-18 ENCOUNTER — Encounter: Payer: Self-pay | Admitting: *Deleted

## 2016-04-18 VITALS — BP 136/72 | HR 76 | Ht 62.0 in | Wt 174.2 lb

## 2016-04-18 DIAGNOSIS — I639 Cerebral infarction, unspecified: Secondary | ICD-10-CM | POA: Diagnosis not present

## 2016-04-18 DIAGNOSIS — I2510 Atherosclerotic heart disease of native coronary artery without angina pectoris: Secondary | ICD-10-CM | POA: Diagnosis not present

## 2016-04-18 DIAGNOSIS — I10 Essential (primary) hypertension: Secondary | ICD-10-CM

## 2016-04-18 DIAGNOSIS — E782 Mixed hyperlipidemia: Secondary | ICD-10-CM | POA: Diagnosis not present

## 2016-04-18 DIAGNOSIS — I471 Supraventricular tachycardia: Secondary | ICD-10-CM

## 2016-04-18 MED ORDER — CLOPIDOGREL BISULFATE 75 MG PO TABS: 75.0000 mg | ORAL_TABLET | Freq: Every day | ORAL | 3 refills | Status: DC

## 2016-04-18 MED ORDER — ROSUVASTATIN CALCIUM 5 MG PO TABS: 5.0000 mg | ORAL_TABLET | Freq: Every day | ORAL | 3 refills | Status: DC

## 2016-04-18 NOTE — Progress Notes (Signed)
Cardiology Office Note Date:  04/18/2016  Patient ID:  Kimberly, Trevino 06-04-1945, MRN [id number] PCP:  Annice Needy, MD  Cardiologist:  Dr. Fletcher Anon, MD    Chief Complaint: Hospital follow up  History of Present Illness: Kimberly Trevino is a 70 y.o. female with history of nonobstructive CAD by cardiac cath in 2011, pSVT, HTN, HLD, and recently diagnosed CVA in late November who presents for hospital follow up of her CVA.  She has known pSVT diagnosed in 2011-2012. Since that time she had 3 episodes that required ED visits and termination with adenosine. Previous cardiac cath in 2011 showed 50% LAD stenosis without any other obstructive diseae. Recent nuclear stress test in 05/2014 was normal. At her last follow up with Dr. Fletcher Anon on 03/15/2016 she had not had any further recurrences of SVT since being started on diltiazem. She was also on Toprol at that time. She was recent admitted to [facility] in late Novemeber to RUE weakness, felt to be CVA. MRI brain showed small acute left frontal lobe MCA infarcts without major intracranial arterial occlusion or significan anterior circulation stenosis. Neurology recommneded Plavix 75 mg daily, high-intensity statin, and cardiac monitoring. Source was felt to possibly be embolic. Telemetry without significant arrhythmia per discharge summary. Echo on 11/27 showed an EF of 60%, normal wall motion, GR1DD, mild MR, left atrium was mildly dilated, no cardiac source of embolic was identified. Carotid ultrasound did not show significant stenosis.   She has done well since her discharge. Right upper extremity is back to baseline with normal strength and sensation. No episodes of slurred speech or vision changes. Never with palpitations in the above. No chest pain. Tolerating Plavix and Crestor, needs refills. BP well controlled at home. Wants to get back to work.    Past Medical History:  Diagnosis Date  . Coronary artery disease 2011   Cardiac cath in  2011 at [facility]: 50% mid LAD stenosis without evidence of obstructive disease. Normal ejection fraction. Nuclear stress test done in October of 2013 4 atypical chest pain was normal.  . Hyperlipidemia   . Hypertension   . PSVT (paroxysmal supraventricular tachycardia) ([place])   . Tachycardia 2010    Past Surgical History:  Procedure Laterality Date  . ABDOMINAL HYSTERECTOMY    . CARDIAC CATHETERIZATION  2011   50% to the mid LAD lesion  . PARTIAL HYSTERECTOMY  1999  . TONSILLECTOMY      Current Outpatient Prescriptions  Medication Sig Dispense Refill  . CARTIA XT 240 MG 24 hr capsule Take 240 mg by mouth daily.    . Chlorpheniramine Maleate (ALLERGY PO) Take by mouth daily.    . Cholecalciferol (VITAMIN D PO) Take by mouth daily.    . clopidogrel (PLAVIX) 75 MG tablet Take 1 tablet (75 mg total) by mouth daily. 30 tablet 0  . estradiol (ESTRACE) 0.5 MG tablet Take 0.5 mg by mouth daily.    . fluocinonide cream (LIDEX) AB-123456789 % Apply 1 application topically as needed.     Marland Kitchen lisinopril (PRINIVIL,ZESTRIL) 20 MG tablet Take 20 mg by mouth daily.    . metoprolol succinate (TOPROL-XL) 100 MG 24 hr tablet Take 100 mg by mouth daily. Take with or immediately following a meal.    . Misc Natural Products (GINKOGIN PO) Take by mouth daily.    . Multiple Vitamin (MULTIVITAMIN) tablet Take 1 tablet by mouth daily.    . mupirocin ointment (BACTROBAN) 2 % APPLY TOPICALLY THREE TIMES DAILY    .  oxybutynin (DITROPAN) 5 MG tablet Take 5 mg by mouth daily.    . rosuvastatin (CRESTOR) 5 MG tablet Take 1 tablet (5 mg total) by mouth daily at 6 PM. 30 tablet 0  . Valerian Root 100 MG CAPS Take 100 mg by mouth at bedtime.    . VENTOLIN HFA 108 (90 BASE) MCG/ACT inhaler Inhale 2 puffs into the lungs every 4 (four) hours as needed.     . vitamin C (ASCORBIC ACID) 500 MG tablet Take 500 mg by mouth daily.     No current facility-administered medications for this visit.     Allergies:   Sulfa antibiotics    Social History:  The patient  reports that she has never smoked. She has never used smokeless tobacco. She reports that she does not drink alcohol or use drugs.   Family History:  The patient's family history includes Heart attack in her brother; Heart attack (age of onset: 83) in her father; Heart disease in her father; Hyperlipidemia in her sister and sister; Hypertension in her father, mother, sister, and sister.  ROS:   Review of Systems  Constitutional: Negative for chills, diaphoresis, fever, malaise/fatigue and weight loss.  HENT: Negative for congestion.   Eyes: Negative for discharge and redness.  Respiratory: Negative for cough, hemoptysis, sputum production, shortness of breath and wheezing.   Cardiovascular: Negative for chest pain, palpitations, orthopnea, claudication, leg swelling and PND.  Gastrointestinal: Negative for abdominal pain, blood in stool, heartburn, melena, nausea and vomiting.  Genitourinary: Negative for hematuria.  Musculoskeletal: Negative for falls and myalgias.  Skin: Negative for rash.  Neurological: Negative for dizziness, tingling, tremors, sensory change, speech change, focal weakness, loss of consciousness and weakness.  Endo/Heme/Allergies: Does not bruise/bleed easily.  Psychiatric/Behavioral: Negative for substance abuse. The patient is not nervous/anxious.   All other systems reviewed and are negative.    PHYSICAL EXAM:  VS:  BP 136/72 (BP Location: Left Arm, Patient Position: Sitting, Cuff Size: Normal)   Pulse 76   Ht 5\' 2"  (1.575 m)   Wt 174 lb 4 oz (79 kg)   BMI 31.87 kg/m  BMI: Body mass index is 31.87 kg/m.  Physical Exam  Constitutional: She is oriented to person, place, and time. She appears well-developed and well-nourished.  HENT:  Head: Normocephalic and atraumatic.  Eyes: Right eye exhibits no discharge. Left eye exhibits no discharge.  Neck: Normal range of motion. No JVD present.  Cardiovascular: Normal rate, regular  rhythm, S1 normal, S2 normal and normal heart sounds.  Exam reveals no distant heart sounds, no friction rub, no midsystolic click and no opening snap.   No murmur heard. Pulmonary/Chest: Effort normal and breath sounds normal. No respiratory distress. She has no decreased breath sounds. She has no wheezes. She has no rales. She exhibits no tenderness.  Abdominal: Soft. She exhibits no distension. There is no tenderness.  Musculoskeletal: She exhibits no edema.       Right shoulder: She exhibits normal strength.  Neurological: She is alert and oriented to person, place, and time. She has normal strength.  Skin: Skin is warm and dry. No cyanosis. Nails show no clubbing.  Psychiatric: She has a normal mood and affect. Her speech is normal and behavior is normal. Judgment and thought content normal.     EKG:  Was ordered and interpreted by me today. Shows NSR with sinus arrhythmia, 76 bpm, left axis deviation, poor R wave progression, no acute st/t changes  Recent Labs: 04/10/2016: ALT 25; BUN  15; Creatinine, Ser 0.78; Hemoglobin 14.8; Magnesium 2.0; Platelets 278; Potassium 3.6; Sodium 140  04/11/2016: Cholesterol 208; HDL 36; LDL Cholesterol 113; Total CHOL/HDL Ratio 5.8; Triglycerides 294; VLDL 59   Estimated Creatinine Clearance: 64.6 mL/min (by C-G formula based on SCr of 0.78 mg/dL).   Wt Readings from Last 3 Encounters:  04/18/16 174 lb 4 oz (79 kg)  04/10/16 175 lb (79.4 kg)  03/15/16 177 lb 8 oz (80.5 kg)     Other studies reviewed: Additional studies/records reviewed today include: summarized above  ASSESSMENT AND PLAN:  1. Cryptogenic CVA: Strength has returned back to baseline. Tolerating Plavix without issues. Schedule 30-day event monitor to evaluate for Afib/flutter possibly leading to her CVA. If 30-day event monitor is unrevealing she will likely need to be seen by EP for evaluation of ILR. Continue Plavix and Crestor. Recheck lipid and liver in 6 weeks for evaluate for  LDL at goal < 100. Cleared for work.  2. Paroxysmal SVT: No episodes of palpitations. Has done well since starting long-acting diltiazem. Continue Cartia XT 240 mg daily and Toprol XL 100 mg daily.   3. HLD: Crestor as above. If LDL is not at goal may need to add Zetia.   4. HTN: BP well controlled at home. Continue current medications.   5. Nonocclusive CAD: No symptoms concerning for angina. No plans for further ischemia evaluation at this time. Continue current medications.   Disposition: F/u with me in 6 weeks.  Current medicines are reviewed at length with the patient today.  The patient did not have any concerns regarding medicines.  Melvern Banker PA-C 04/18/2016 3:06 PM     [address] [place], [place] [address] [phone]

## 2016-04-18 NOTE — Patient Instructions (Signed)
Medication Instructions:  Refills sent in for Plavix and Crestor to [facility]  Labwork: Your physician recommends that you return for lab work in: 6 weeks for fasting lipid and liver panel. Make sure not to eat or drink after midnight prior to coming in.    Testing/Procedures: Your physician has recommended that you wear an event monitor. Event monitors are medical devices that record the heart's electrical activity. Doctors most often [place] these monitors to diagnose arrhythmias. Arrhythmias are problems with the speed or rhythm of the heartbeat. The monitor is a small, portable device. You can wear one while you do your normal daily activities. This is usually used to diagnose what is causing palpitations/syncope (passing out).   Follow-Up: Your physician recommends that you schedule a follow-up appointment in: 6 weeks with Christell Faith PA  It was a pleasure seeing you today here in the office. Please do not hesitate to give [place] a call back if you have any further questions. [place], BSN

## 2016-04-21 ENCOUNTER — Ambulatory Visit (INDEPENDENT_AMBULATORY_CARE_PROVIDER_SITE_OTHER): Payer: Medicare Other

## 2016-04-21 DIAGNOSIS — I471 Supraventricular tachycardia: Secondary | ICD-10-CM

## 2016-04-21 DIAGNOSIS — I10 Essential (primary) hypertension: Secondary | ICD-10-CM | POA: Diagnosis not present

## 2016-04-21 DIAGNOSIS — I2510 Atherosclerotic heart disease of native coronary artery without angina pectoris: Secondary | ICD-10-CM | POA: Diagnosis not present

## 2016-05-11 ENCOUNTER — Telehealth: Payer: Self-pay | Admitting: Cardiovascular Disease

## 2016-05-11 NOTE — Telephone Encounter (Signed)
°*  STAT* If patient is at the pharmacy, call can be transferred to refill team.   1. Which medications need to be refilled? (please list name of each medication and dose if known) clopidogrel 75 mg    2. Which pharmacy/location (including street and city if local pharmacy) is medication to be sent to? [facility] on [address]   3. Do they need a 30 day or 90 day supply? 90 day    She is also asking if this is a blood thinner, we took her off one medication and placed her on this  She is asking for [place] to call back

## 2016-05-12 NOTE — Telephone Encounter (Signed)
Called patient to inform her that Crestor 5mg  and Plavix 75mg  were both sent in to [facility]

## 2016-05-12 NOTE — Telephone Encounter (Signed)
Pt stated she hasn't received those medications in the mail yet. Pt is going to call [facility]. If delivery is going to take longer than a week, she will call back so she can have [place] call in scripts to [facility] on [address]. Also, pt was advised that Plavix is a blood thinner.

## 2016-05-25 ENCOUNTER — Other Ambulatory Visit (INDEPENDENT_AMBULATORY_CARE_PROVIDER_SITE_OTHER): Payer: Medicare Other | Admitting: *Deleted

## 2016-05-25 DIAGNOSIS — I2510 Atherosclerotic heart disease of native coronary artery without angina pectoris: Secondary | ICD-10-CM | POA: Diagnosis not present

## 2016-05-25 DIAGNOSIS — I10 Essential (primary) hypertension: Secondary | ICD-10-CM

## 2016-05-25 DIAGNOSIS — I471 Supraventricular tachycardia: Secondary | ICD-10-CM

## 2016-05-26 LAB — LIPID PANEL
CHOL/HDL RATIO: 3.9 ratio (ref 0.0–4.4)
Cholesterol, Total: 157 mg/dL (ref 100–199)
HDL: 40 mg/dL (ref >39)
LDL Calculated: 75 mg/dL (ref 0–99)
Triglycerides: 212 mg/dL — ABNORMAL HIGH (ref 0–149)
VLDL Cholesterol Cal: 42 mg/dL — ABNORMAL HIGH (ref 5–40)

## 2016-05-26 LAB — HEPATIC FUNCTION PANEL
ALBUMIN: 4.3 g/dL (ref 3.6–4.8)
ALT: 20 IU/L (ref 0–32)
AST: 20 IU/L (ref 0–40)
Alkaline Phosphatase: 67 IU/L (ref 39–117)
BILIRUBIN TOTAL: 0.8 mg/dL (ref 0.0–1.2)
BILIRUBIN, DIRECT: 0.17 mg/dL (ref 0.00–0.40)
Total Protein: 6.8 g/dL (ref 6.0–8.5)

## 2016-05-27 ENCOUNTER — Other Ambulatory Visit: Payer: Self-pay

## 2016-05-27 DIAGNOSIS — I4729 Other ventricular tachycardia: Secondary | ICD-10-CM

## 2016-05-27 DIAGNOSIS — I472 Ventricular tachycardia: Secondary | ICD-10-CM

## 2016-05-30 ENCOUNTER — Ambulatory Visit (INDEPENDENT_AMBULATORY_CARE_PROVIDER_SITE_OTHER): Payer: Medicare Other | Admitting: Cardiovascular Disease

## 2016-05-30 ENCOUNTER — Encounter: Payer: Self-pay | Admitting: Cardiovascular Disease

## 2016-05-30 VITALS — BP 140/72 | HR 77 | Ht 62.0 in | Wt 173.0 lb

## 2016-05-30 DIAGNOSIS — I10 Essential (primary) hypertension: Secondary | ICD-10-CM | POA: Diagnosis not present

## 2016-05-30 DIAGNOSIS — I471 Supraventricular tachycardia: Secondary | ICD-10-CM

## 2016-05-30 DIAGNOSIS — E782 Mixed hyperlipidemia: Secondary | ICD-10-CM | POA: Diagnosis not present

## 2016-05-30 DIAGNOSIS — G4730 Sleep apnea, unspecified: Secondary | ICD-10-CM

## 2016-05-30 MED ORDER — ROSUVASTATIN CALCIUM 10 MG PO TABS: 10.0000 mg | ORAL_TABLET | Freq: Every day | ORAL | 2 refills | Status: DC

## 2016-05-30 NOTE — Patient Instructions (Addendum)
Medication Instructions:  Your physician has recommended you make the following change in your medication:  INCREASE crestor to 10mg  once daily   Labwork: none  Testing/Procedures: none  Follow-Up: Your physician wants you to follow-up in: 6 months with Arida. You will receive a reminder letter in the mail two months in advance. If you don't receive a letter, please call our office to schedule the follow-up appointment.   Any Other Special Instructions Will Be Listed Below (If Applicable). Refer to pulmonary for sleep apnea     If you need a refill on your cardiac medications before your next appointment, please call your pharmacy.  You have an appointment at  [facility]  [phone]  January 30 Arrive at 8:45am Mable Paris, NP

## 2016-05-30 NOTE — Progress Notes (Signed)
Cardiology Office Note   Date:  05/30/2016   ID:  Kimberly, Trevino [birth year], MRN [id number]  PCP:  Annice Needy, MD  Cardiologist:   [name], MD   Chief Complaint  Patient presents with  . other    6 wk f/u c/o feeling forgetful, clumsy, jittery or nervous. Pt would like to ask about a Recommended PCP. Meds reviewed verbally with pt.      History of Present Illness: Kimberly Trevino is a 71 y.o. female who presents for a follow up regarding paroxysmal supraventricular tachycardia and nonobstructive coronary artery disease. The patient has known history of paroxysmal supraventricular tachycardia which was diagnosed in 2011-2012 . Since that time she had 3 episodes that required emergency room visits and termination with adenosine.  Previous cardiac catheterization done in 2011  showed a 50% LAD stenosis without any other obstructive disease. Nuclear stress test in January 2016 was normal.  She was hospitalized in November with a stroke. MRI of the brain showed small acute left frontal lobe MCA infarct which was felt to be embolic. Echocardiogram showed normal LV systolic function and no cardiac source of embolism. Neurology recommended Plavix and statin. She had a 30 day outpatient telemetry which showed no evidence of atrial fibrillation. There was one brief episode of SVT which happened at night and self terminated. She has been doing reasonably well but complains of some balance and memory problems. She has no history of sleep apnea but is aware of very loud snoring. She does not sleep well at night and wakes up frequently. She usually takes a nap by the end of the day.   Past Medical History:  Diagnosis Date  . Coronary artery disease 2011   Cardiac cath in 2011 at [facility]: 50% mid LAD stenosis without evidence of obstructive disease. Normal ejection fraction. Nuclear stress test done in October of 2013 4 atypical chest pain was normal.  . Hyperlipidemia   .  Hypertension   . PSVT (paroxysmal supraventricular tachycardia) ([place])   . Tachycardia 2010    Past Surgical History:  Procedure Laterality Date  . ABDOMINAL HYSTERECTOMY    . CARDIAC CATHETERIZATION  2011   50% to the mid LAD lesion  . PARTIAL HYSTERECTOMY  1999  . TONSILLECTOMY       Current Outpatient Prescriptions  Medication Sig Dispense Refill  . CARTIA XT 240 MG 24 hr capsule Take 240 mg by mouth daily.    . Cholecalciferol (VITAMIN D PO) Take by mouth daily.    . clopidogrel (PLAVIX) 75 MG tablet Take 1 tablet (75 mg total) by mouth daily. 90 tablet 3  . estradiol (ESTRACE) 0.5 MG tablet Take 0.5 mg by mouth daily.    . fluocinonide cream (LIDEX) AB-123456789 % Apply 1 application topically as needed.     Marland Kitchen lisinopril (PRINIVIL,ZESTRIL) 20 MG tablet Take 20 mg by mouth daily.    Marland Kitchen loratadine (CLARITIN) 10 MG tablet Take 10 mg by mouth daily.    . metoprolol succinate (TOPROL-XL) 100 MG 24 hr tablet Take 100 mg by mouth daily. Take with or immediately following a meal.    . Misc Natural Products (GINKOGIN PO) Take by mouth daily.    . Multiple Vitamin (MULTIVITAMIN) tablet Take 1 tablet by mouth daily.    . mupirocin ointment (BACTROBAN) 2 % APPLY TOPICALLY THREE TIMES DAILY    . oxybutynin (DITROPAN) 5 MG tablet Take 5 mg by mouth daily.    . rosuvastatin (CRESTOR) 5  MG tablet Take 1 tablet (5 mg total) by mouth daily at 6 PM. 90 tablet 3  . Valerian Root 100 MG CAPS Take 100 mg by mouth at bedtime.    . VENTOLIN HFA 108 (90 BASE) MCG/ACT inhaler Inhale 2 puffs into the lungs every 4 (four) hours as needed.     . vitamin C (ASCORBIC ACID) 500 MG tablet Take 500 mg by mouth daily.     No current facility-administered medications for this visit.     Allergies:   Sulfa antibiotics    Social History:  The patient  reports that she has never smoked. She has never used smokeless tobacco. She reports that she does not drink alcohol or use drugs.   Family History:  The patient's  family history includes Heart attack in her brother; Heart attack (age of onset: 13) in her father; Heart disease in her father; Hyperlipidemia in her sister and sister; Hypertension in her father, mother, sister, and sister.    ROS:  Please see the history of present illness.   Otherwise, review of systems are positive for none.   All other systems are reviewed and negative.    PHYSICAL EXAM: VS:  BP 140/72 (BP Location: Left Arm, Patient Position: Sitting, Cuff Size: Normal)   Pulse 77   Ht 5\' 2"  (1.575 m)   Wt 173 lb (78.5 kg)   BMI 31.64 kg/m  , BMI Body mass index is 31.64 kg/m. GEN: Well nourished, well developed, in no acute distress  HEENT: normal  Neck: no JVD, carotid bruits, or masses Cardiac: RRR; no  rubs, or gallops, trace edema . One out of 6 systolic ejection murmur in the aortic area Respiratory:  clear to auscultation bilaterally, normal work of breathing GI: soft, nontender, nondistended, + BS MS: no deformity or atrophy  Skin: warm and dry, no rash Neuro:  Strength and sensation are intact Psych: euthymic mood, full affect   EKG:  EKG is ordered today. The ekg ordered today demonstrates normal sinus rhythm with no significant ST or T wave changes.   Recent Labs: 04/10/2016: BUN 15; Creatinine, Ser 0.78; Hemoglobin 14.8; Magnesium 2.0; Platelets 278; Potassium 3.6; Sodium 140 05/25/2016: ALT 20    Lipid Panel    Component Value Date/Time   CHOL 157 05/25/2016 0746   TRIG 212 (H) 05/25/2016 0746   HDL 40 05/25/2016 0746   CHOLHDL 3.9 05/25/2016 0746   CHOLHDL 5.8 04/11/2016 0449   VLDL 59 (H) 04/11/2016 0449   LDLCALC 75 05/25/2016 0746      Wt Readings from Last 3 Encounters:  05/30/16 173 lb (78.5 kg)  04/18/16 174 lb 4 oz (79 kg)  04/10/16 175 lb (79.4 kg)      No flowsheet data found.    ASSESSMENT AND PLAN:  1.   Paroxysmal supraventricular tachycardia: No recurrent episodes on diltiazem and metoprolol. Continue same treatment. She  did have one brief episode of SVT at night while she was wearing outpatient telemetry. I do think this episode was significant enough to change management. Symptomatically she seems to be well-controlled with metoprolol and diltiazem.  2. Essential hypertension: Blood pressure is well controlled on current medications.  3. Coronary artery disease involving native coronary arteries without angina: Continue medical therapy.  4. Hyperlipidemia:  I reviewed most recent lipid profile which showed an LDL of 75 and triglyceride of 212. I increased rosuvastatin 10 mg daily.  5. Possible sleep apnea: She has symptoms suggestive of sleep apnea. She had  nighttime arrhythmia which also increases the suspicion for this. I'm referring her to pulmonary for evaluation.  Disposition:   FU with me in 6 months.   Signed,  [name], MD  05/30/2016 2:54 PM    [facility]

## 2016-06-09 ENCOUNTER — Inpatient Hospital Stay
Admission: EM | Admit: 2016-06-09 | Discharge: 2016-06-13 | DRG: 066 | Disposition: A | Discharge status: Home / Self Care | Payer: Medicare Other | Attending: Internal Medicine | Admitting: Internal Medicine

## 2016-06-09 ENCOUNTER — Emergency Department: Payer: Medicare Other

## 2016-06-09 ENCOUNTER — Encounter: Payer: Self-pay | Admitting: Emergency Medicine

## 2016-06-09 DIAGNOSIS — Z8249 Family history of ischemic heart disease and other diseases of the circulatory system: Secondary | ICD-10-CM | POA: Diagnosis not present

## 2016-06-09 DIAGNOSIS — E7800 Pure hypercholesterolemia, unspecified: Secondary | ICD-10-CM | POA: Diagnosis present

## 2016-06-09 DIAGNOSIS — R2681 Unsteadiness on feet: Secondary | ICD-10-CM | POA: Diagnosis present

## 2016-06-09 DIAGNOSIS — J45909 Unspecified asthma, uncomplicated: Secondary | ICD-10-CM | POA: Diagnosis present

## 2016-06-09 DIAGNOSIS — Z79890 Hormone replacement therapy (postmenopausal): Secondary | ICD-10-CM

## 2016-06-09 DIAGNOSIS — Z8673 Personal history of transient ischemic attack (TIA), and cerebral infarction without residual deficits: Secondary | ICD-10-CM | POA: Diagnosis not present

## 2016-06-09 DIAGNOSIS — I2510 Atherosclerotic heart disease of native coronary artery without angina pectoris: Secondary | ICD-10-CM | POA: Diagnosis present

## 2016-06-09 DIAGNOSIS — E876 Hypokalemia: Secondary | ICD-10-CM | POA: Diagnosis present

## 2016-06-09 DIAGNOSIS — I639 Cerebral infarction, unspecified: Secondary | ICD-10-CM | POA: Diagnosis present

## 2016-06-09 DIAGNOSIS — Z7902 Long term (current) use of antithrombotics/antiplatelets: Secondary | ICD-10-CM | POA: Diagnosis not present

## 2016-06-09 DIAGNOSIS — R29700 NIHSS score 0: Secondary | ICD-10-CM | POA: Diagnosis present

## 2016-06-09 DIAGNOSIS — R262 Difficulty in walking, not elsewhere classified: Secondary | ICD-10-CM

## 2016-06-09 DIAGNOSIS — I6340 Cerebral infarction due to embolism of unspecified cerebral artery: Secondary | ICD-10-CM | POA: Diagnosis present

## 2016-06-09 DIAGNOSIS — Z882 Allergy status to sulfonamides status: Secondary | ICD-10-CM | POA: Diagnosis not present

## 2016-06-09 DIAGNOSIS — Z8241 Family history of sudden cardiac death: Secondary | ICD-10-CM

## 2016-06-09 DIAGNOSIS — I10 Essential (primary) hypertension: Secondary | ICD-10-CM | POA: Diagnosis present

## 2016-06-09 DIAGNOSIS — E785 Hyperlipidemia, unspecified: Secondary | ICD-10-CM | POA: Diagnosis present

## 2016-06-09 DIAGNOSIS — R42 Dizziness and giddiness: Secondary | ICD-10-CM | POA: Diagnosis present

## 2016-06-09 HISTORY — DX: Transient cerebral ischemic attack, unspecified: G45.9

## 2016-06-09 HISTORY — DX: Unspecified asthma, uncomplicated: J45.909

## 2016-06-09 LAB — DIFFERENTIAL
BASOS PCT: 0 %
Basophils Absolute: 0 10*3/uL (ref 0–0.1)
EOS ABS: 0.3 10*3/uL (ref 0–0.7)
Eosinophils Relative: 8 %
Lymphocytes Relative: 33 %
Lymphs Abs: 1.3 10*3/uL (ref 1.0–3.6)
MONOS PCT: 10 %
Monocytes Absolute: 0.4 10*3/uL (ref 0.2–0.9)
NEUTROS PCT: 49 %
Neutro Abs: 1.8 10*3/uL (ref 1.4–6.5)

## 2016-06-09 LAB — APTT: aPTT: 25 seconds (ref 24–36)

## 2016-06-09 LAB — CBC
HCT: 38.1 % (ref 35.0–47.0)
Hemoglobin: 13.5 g/dL (ref 12.0–16.0)
MCH: 29.2 pg (ref 26.0–34.0)
MCHC: 35.5 g/dL (ref 32.0–36.0)
MCV: 82.3 fL (ref 80.0–100.0)
PLATELETS: 302 10*3/uL (ref 150–440)
RBC: 4.64 MIL/uL (ref 3.80–5.20)
RDW: 13.5 % (ref 11.5–14.5)
WBC: 3.8 10*3/uL (ref 3.6–11.0)

## 2016-06-09 LAB — COMPREHENSIVE METABOLIC PANEL
ALBUMIN: 4.5 g/dL (ref 3.5–5.0)
ALT: 24 U/L (ref 14–54)
AST: 27 U/L (ref 15–41)
Alkaline Phosphatase: 60 U/L (ref 38–126)
Anion gap: 8 (ref 5–15)
BUN: 15 mg/dL (ref 6–20)
CHLORIDE: 107 mmol/L (ref 101–111)
CO2: 24 mmol/L (ref 22–32)
Calcium: 9 mg/dL (ref 8.9–10.3)
Creatinine, Ser: 0.72 mg/dL (ref 0.44–1.00)
GFR calc Af Amer: >60 mL/min (ref >60)
Glucose, Bld: 99 mg/dL (ref 65–99)
POTASSIUM: 3.2 mmol/L — AB (ref 3.5–5.1)
Sodium: 139 mmol/L (ref 135–145)
Total Bilirubin: 1 mg/dL (ref 0.3–1.2)
Total Protein: 7.4 g/dL (ref 6.5–8.1)

## 2016-06-09 LAB — PROTIME-INR
INR: 0.91
Prothrombin Time: 12.2 seconds (ref 11.4–15.2)

## 2016-06-09 LAB — TROPONIN I

## 2016-06-09 MED ORDER — ROSUVASTATIN CALCIUM 10 MG PO TABS
5.0000 mg | ORAL_TABLET | ORAL | Status: DC
  Administered 2016-06-09 – 2016-06-12 (×4): 5 mg via ORAL
  Filled 2016-06-09 (×4): qty 1

## 2016-06-09 MED ORDER — DIAZEPAM 5 MG PO TABS
5.0000 mg | ORAL_TABLET | Freq: Once | ORAL | Status: AC
  Administered 2016-06-09: 5 mg via ORAL

## 2016-06-09 MED ORDER — LISINOPRIL 20 MG PO TABS
20.0000 mg | ORAL_TABLET | Freq: Every day | ORAL | Status: DC
  Administered 2016-06-10 – 2016-06-12 (×3): 20 mg via ORAL
  Filled 2016-06-09 (×5): qty 1

## 2016-06-09 MED ORDER — ENOXAPARIN SODIUM 40 MG/0.4ML ~~LOC~~ SOLN
40.0000 mg | SUBCUTANEOUS | Status: DC
  Administered 2016-06-09 – 2016-06-11 (×3): 40 mg via SUBCUTANEOUS
  Filled 2016-06-09 (×3): qty 0.4

## 2016-06-09 MED ORDER — CLOPIDOGREL BISULFATE 75 MG PO TABS
75.0000 mg | ORAL_TABLET | Freq: Every day | ORAL | Status: DC
  Administered 2016-06-09 – 2016-06-13 (×5): 75 mg via ORAL
  Filled 2016-06-09 (×5): qty 1

## 2016-06-09 MED ORDER — DIAZEPAM 5 MG PO TABS
ORAL_TABLET | ORAL | Status: AC
  Administered 2016-06-09: 5 mg via ORAL
  Filled 2016-06-09: qty 1

## 2016-06-09 MED ORDER — VITAMIN C 500 MG PO TABS
500.0000 mg | ORAL_TABLET | Freq: Every day | ORAL | Status: DC
  Administered 2016-06-10 – 2016-06-13 (×4): 500 mg via ORAL
  Filled 2016-06-09 (×4): qty 1

## 2016-06-09 MED ORDER — ONDANSETRON HCL 4 MG PO TABS: 4.0000 mg | ORAL_TABLET | Freq: Four times a day (QID) | ORAL | Status: DC | PRN

## 2016-06-09 MED ORDER — METOPROLOL SUCCINATE ER 100 MG PO TB24
100.0000 mg | ORAL_TABLET | Freq: Every day | ORAL | Status: DC
  Administered 2016-06-10 – 2016-06-12 (×3): 100 mg via ORAL
  Filled 2016-06-09 (×4): qty 1

## 2016-06-09 MED ORDER — CHOLECALCIFEROL 10 MCG (400 UNIT) PO TABS
400.0000 [IU] | ORAL_TABLET | Freq: Every day | ORAL | Status: DC
Start: 2016-06-09 — End: 2016-06-13
  Administered 2016-06-10 – 2016-06-13 (×4): 400 [IU] via ORAL
  Filled 2016-06-09 (×4): qty 1

## 2016-06-09 MED ORDER — ASPIRIN 81 MG PO CHEW
324.0000 mg | CHEWABLE_TABLET | Freq: Once | ORAL | Status: AC
  Administered 2016-06-09: 324 mg via ORAL

## 2016-06-09 MED ORDER — TRAMADOL HCL 50 MG PO TABS: 50.0000 mg | ORAL_TABLET | Freq: Four times a day (QID) | ORAL | Status: DC | PRN

## 2016-06-09 MED ORDER — BISACODYL 10 MG RE SUPP: 10.0000 mg | Freq: Every day | RECTAL | Status: DC | PRN

## 2016-06-09 MED ORDER — ALBUTEROL SULFATE (2.5 MG/3ML) 0.083% IN NEBU: 2.5000 mg | INHALATION_SOLUTION | RESPIRATORY_TRACT | Status: DC | PRN

## 2016-06-09 MED ORDER — ONE-DAILY MULTI VITAMINS PO TABS: 1.0000 | ORAL_TABLET | Freq: Every day | ORAL | Status: DC

## 2016-06-09 MED ORDER — SENNA 8.6 MG PO TABS
1.0000 | ORAL_TABLET | Freq: Two times a day (BID) | ORAL | Status: DC
  Administered 2016-06-09 – 2016-06-13 (×7): 8.6 mg via ORAL
  Filled 2016-06-09 (×8): qty 1

## 2016-06-09 MED ORDER — ALBUTEROL SULFATE HFA 108 (90 BASE) MCG/ACT IN AERS
2.0000 | INHALATION_SPRAY | RESPIRATORY_TRACT | Status: DC | PRN
Start: 2016-06-09 — End: 2016-06-09

## 2016-06-09 MED ORDER — ONDANSETRON HCL 4 MG/2ML IJ SOLN: 4.0000 mg | Freq: Four times a day (QID) | INTRAMUSCULAR | Status: DC | PRN

## 2016-06-09 MED ORDER — ASPIRIN EC 81 MG PO TBEC
81.0000 mg | DELAYED_RELEASE_TABLET | Freq: Every day | ORAL | Status: DC
  Administered 2016-06-10 – 2016-06-13 (×4): 81 mg via ORAL
  Filled 2016-06-09 (×4): qty 1

## 2016-06-09 MED ORDER — ASPIRIN 81 MG PO CHEW
CHEWABLE_TABLET | ORAL | Status: AC
  Administered 2016-06-09: 324 mg via ORAL
  Filled 2016-06-09: qty 4

## 2016-06-09 MED ORDER — SODIUM CHLORIDE 0.9 % IV SOLN
30.0000 meq | Freq: Once | INTRAVENOUS | Status: AC
  Administered 2016-06-09: 22:00:00 30 meq via INTRAVENOUS
  Filled 2016-06-09: qty 15

## 2016-06-09 MED ORDER — ACETAMINOPHEN 325 MG PO TABS: 650.0000 mg | ORAL_TABLET | Freq: Four times a day (QID) | ORAL | Status: DC | PRN

## 2016-06-09 MED ORDER — LORATADINE 10 MG PO TABS
10.0000 mg | ORAL_TABLET | Freq: Every day | ORAL | Status: DC
  Administered 2016-06-10 – 2016-06-13 (×4): 10 mg via ORAL
  Filled 2016-06-09 (×4): qty 1

## 2016-06-09 MED ORDER — DILTIAZEM HCL ER COATED BEADS 120 MG PO CP24
240.0000 mg | ORAL_CAPSULE | Freq: Every day | ORAL | Status: DC
  Administered 2016-06-09 – 2016-06-12 (×4): 240 mg via ORAL
  Filled 2016-06-09 (×5): qty 2

## 2016-06-09 MED ORDER — SODIUM CHLORIDE 0.9 % IV SOLN
Freq: Once | INTRAVENOUS | Status: AC
  Administered 2016-06-09: 22:00:00 via INTRAVENOUS

## 2016-06-09 MED ORDER — STROKE: EARLY STAGES OF RECOVERY BOOK
Freq: Once | Status: AC
  Administered 2016-06-09: 21:00:00 1

## 2016-06-09 MED ORDER — ACETAMINOPHEN 650 MG RE SUPP: 650.0000 mg | Freq: Four times a day (QID) | RECTAL | Status: DC | PRN

## 2016-06-09 MED ORDER — ADULT MULTIVITAMIN W/MINERALS CH
1.0000 | ORAL_TABLET | Freq: Every day | ORAL | Status: DC
  Administered 2016-06-10 – 2016-06-13 (×4): 1 via ORAL
  Filled 2016-06-09 (×4): qty 1

## 2016-06-09 MED ORDER — OXYBUTYNIN CHLORIDE 5 MG PO TABS
5.0000 mg | ORAL_TABLET | Freq: Every day | ORAL | Status: DC
  Administered 2016-06-09 – 2016-06-13 (×5): 5 mg via ORAL
  Filled 2016-06-09 (×5): qty 1

## 2016-06-09 NOTE — ED Provider Notes (Signed)
[facility] Emergency Department Provider Note  ____________________________________________   First MD Initiated Contact with Patient 06/09/16 1704     (approximate)  I have reviewed the triage vital signs and the nursing notes.   HISTORY  Chief Complaint Dizziness    HPI Kimberly Trevino is a 71 y.o. female who reports a history of high blood pressure, high cholesterol, and a TIA about 2 months ago for which she takes Plavix who presents for about 4 days of dizziness/vertigo.  She states that the onset of symptoms was acute 4 days ago and she felt like it was getting better but then it got worse again today.  She was at work and was unable to stand up and felt nauseated although she did not vomit.  She feels like the room is spinning.  Symptoms are moderate but enough that she feels unsteady on her feet.  She also feels like both of her ears are clogged up or "like I am on a mountain".  She has no other sinus or viral symptoms.  She denies chest pain, shortness of breath, abdominal pain, diarrhea, dysuria.  She does not have a history of migraines.  She states that she had some dizziness many years ago but that it is not a regular issue for her.  Standing up and trying to ambulate makes symptoms worse and holding still makes it a little bit better.   Past Medical History:  Diagnosis Date  . Coronary artery disease 2011   Cardiac cath in 2011 at [facility]: 50% mid LAD stenosis without evidence of obstructive disease. Normal ejection fraction. Nuclear stress test done in October of 2013 4 atypical chest pain was normal.  . Hyperlipidemia   . Hypertension   . PSVT (paroxysmal supraventricular tachycardia) ([place])   . Tachycardia 2010  . TIA (transient ischemic attack)     Patient Active Problem List   Diagnosis Date Noted  . CVA (cerebral vascular accident) ([facility]) 04/11/2016  . TIA (transient ischemic attack) 04/10/2016  . Leg edema 12/04/2012  . PSVT  (paroxysmal supraventricular tachycardia) ([place])   . Coronary artery disease   . Hyperlipidemia   . Hypertension     Past Surgical History:  Procedure Laterality Date  . ABDOMINAL HYSTERECTOMY    . CARDIAC CATHETERIZATION  2011   50% to the mid LAD lesion  . PARTIAL HYSTERECTOMY  1999  . TONSILLECTOMY      Prior to Admission medications   Medication Sig Start Date End Date Taking? Authorizing Provider  CARTIA XT 240 MG 24 hr capsule Take 240 mg by mouth daily. 03/29/16   Historical Provider, MD  Cholecalciferol (VITAMIN D PO) Take by mouth daily.    Historical Provider, MD  clopidogrel (PLAVIX) 75 MG tablet Take 1 tablet (75 mg total) by mouth daily. 04/18/16   Rise Mu, PA-C  estradiol (ESTRACE) 0.5 MG tablet Take 0.5 mg by mouth daily.    Historical Provider, MD  fluocinonide cream (LIDEX) [id number] % Apply 1 application topically as needed.  07/08/13   Historical Provider, MD  lisinopril (PRINIVIL,ZESTRIL) 20 MG tablet Take 20 mg by mouth daily.    Historical Provider, MD  loratadine (CLARITIN) 10 MG tablet Take 10 mg by mouth daily.    Historical Provider, MD  metoprolol succinate (TOPROL-XL) 100 MG 24 hr tablet Take 100 mg by mouth daily. Take with or immediately following a meal.    Historical Provider, MD  Misc Natural Products (GINKOGIN PO) Take by mouth  daily.    Historical Provider, MD  Multiple Vitamin (MULTIVITAMIN) tablet Take 1 tablet by mouth daily.    Historical Provider, MD  mupirocin ointment (BACTROBAN) 2 % APPLY TOPICALLY THREE TIMES DAILY 04/06/16   Historical Provider, MD  oxybutynin (DITROPAN) 5 MG tablet Take 5 mg by mouth daily.    Historical Provider, MD  rosuvastatin (CRESTOR) 10 MG tablet Take 1 tablet (10 mg total) by mouth daily at 6 PM. 05/30/16   Wellington Hampshire, MD  Valerian Root 100 MG CAPS Take 100 mg by mouth at bedtime.    Historical Provider, MD  VENTOLIN HFA 108 (90 BASE) MCG/ACT inhaler Inhale 2 puffs into the lungs every 4 (four) hours as needed.   06/08/13   Historical Provider, MD  vitamin C (ASCORBIC ACID) 500 MG tablet Take 500 mg by mouth daily.    Historical Provider, MD    Allergies Sulfa antibiotics  Family History  Problem Relation Age of Onset  . Hypertension Mother   . Heart attack Father 36    MI  . Heart disease Father   . Hypertension Father   . Hypertension Sister   . Hyperlipidemia Sister   . Heart attack Brother     cardiac arrest  . Hypertension Sister   . Hyperlipidemia Sister   . Breast cancer Neg Hx     Social History Social History  Substance Use Topics  . Smoking status: Never Smoker  . Smokeless tobacco: Never Used  . Alcohol use No    Review of Systems Constitutional: No fever/chills Eyes: No visual changes. ENT: No sore throat. Feels like her ears are full Cardiovascular: Denies chest pain. Respiratory: Denies shortness of breath. Gastrointestinal: No abdominal pain.  No nausea, no vomiting.  No diarrhea.  No constipation. Genitourinary: Negative for dysuria. Musculoskeletal: Negative for back pain. Skin: Negative for rash. Neurological: Moderate to severe dizziness with gait instability.  No numbness nor weakness in her extremities  10-point ROS otherwise negative.  ____________________________________________   PHYSICAL EXAM:  VITAL SIGNS: ED Triage Vitals [06/09/16 1252]  Enc Vitals Group     BP (!) 180/78     Pulse Rate 81     Resp 17     Temp 98 F (36.7 C)     Temp Source Oral     SpO2 98 %     Weight 173 lb (78.5 kg)     Height 5\' 2"  (1.575 m)     Head Circumference      Peak Flow      Pain Score      Pain Loc      Pain Edu?      Excl. in [place]?     Constitutional: Alert and oriented. Well appearing and in no acute distress. Eyes: Conjunctivae are normal. PERRL. EOMI. Mild horizontal nystagmus at extremes of lateral gaze. Head: Atraumatic. Nose: No congestion/rhinnorhea. Mouth/Throat: Mucous membranes are moist.  Oropharynx non-erythematous. Neck: No  stridor.  No meningeal signs.   Cardiovascular: Normal rate, regular rhythm. Good peripheral circulation. Grossly normal heart sounds. Respiratory: Normal respiratory effort.  No retractions. Lungs CTAB. Gastrointestinal: Soft and nontender. No distention.  Musculoskeletal: No lower extremity tenderness nor edema. No gross deformities of extremities. Neurologic:  Normal speech and language.  Good bilateral grip strength in upper and lower extremity strength.  Normal finger to nose testing.  No visual disturbances.  When I stood the patient up she took some extra time stand up and then felt off balance.  I  had her close her eyes to perform a Romberg test and within a few seconds she fell over and I had to grab her.  She was able to maintain her balance for a few seconds and an effort to test pronator drift and she did not have any drift but she again fell backwards during the test while her eyes were closed.  Given her instability at did not attempt to have her ambulate. Skin:  Skin is warm, dry and intact. No rash noted. Psychiatric: Mood and affect are normal. Speech and behavior are normal.  ____________________________________________   LABS (all labs ordered are listed, but only abnormal results are displayed)  Labs Reviewed  COMPREHENSIVE METABOLIC PANEL - Abnormal; Notable for the following:       Result Value   Potassium 3.2 (*)    All other components within normal limits  PROTIME-INR  APTT  CBC  DIFFERENTIAL  TROPONIN I  CBG MONITORING, ED   ____________________________________________  EKG  ED ECG REPORT I, Jaylyn Booher, the attending physician, personally viewed and interpreted this ECG.  Date: 06/09/2016 EKG Time: 1302 Rate: 76 Rhythm: normal sinus rhythm QRS Axis: Left axis deviation Intervals: normal ST/T Wave abnormalities: normal Conduction Disturbances: none Narrative Interpretation:  unremarkable  ____________________________________________  RADIOLOGY   Ct Head Wo Contrast  Result Date: 06/09/2016 CLINICAL DATA:  Dizziness for several days, nausea and weakness EXAM: CT HEAD WITHOUT CONTRAST TECHNIQUE: Contiguous axial images were obtained from the base of the skull through the vertex without intravenous contrast. COMPARISON:  MR brain of 04/11/2016 and CT brain of 04/10/2016 FINDINGS: Brain: The ventricular system is within normal limits in size and the septum is midline in position. The fourth ventricle and basilar cisterns are unremarkable. No hemorrhage, mass lesion, or acute infarction is seen. Vascular: No vascular abnormality is seen on this unenhanced study. Skull: On bone window images, no calvarial abnormality is seen. Sinuses/Orbits: There is a retention cyst within the left maxillary sinus. Opacification of a few ethmoid air cells is noted. Otherwise the paranasal sinuses are well pneumatized. Other: None. IMPRESSION: 1. Negative unenhanced CT of the brain. 2. Probable retention cysts in the left maxillary sinus with some opacification of a few ethmoid air cells. Electronically Signed   By: Ivar Drape M.D.   On: 06/09/2016 13:35   Mr Angiogram Head Wo Contrast  Result Date: 06/09/2016 CLINICAL DATA:  71 y/o F; severe vertigo, gait instability, history of TIA. EXAM: MRI HEAD WITHOUT CONTRAST MRA HEAD WITHOUT CONTRAST TECHNIQUE: Multiplanar, multiecho pulse sequences of the brain and surrounding structures were obtained without intravenous contrast. Angiographic images of the head were obtained using MRA technique without contrast. COMPARISON:  06/09/2016 CT head. MRI and MRA of head dated 04/11/2016. FINDINGS: MRI HEAD FINDINGS Brain: Scattered subcentimeter foci of diffusion restriction within the left parietal cortex consistent with acute to early subacute infarction. Several small foci of T2 FLAIR hyperintensity in the left frontal lobe correspond to areas of acute  ischemia on prior MRI in are compatible with encephalomalacia sequelae of chronic infarction. No abnormal susceptibility hypointensity to indicate intracranial hemorrhage. No focal mass effect. No extra-axial collection. No hydrocephalus. Vascular: As below. Skull and upper cervical spine: Partially visualize cervical spondylosis in anterior cervical discectomy and fusion probably moderate canal stenosis at C3-4 and C4-5. Calvarium is unremarkable. Sinuses/Orbits: Complete opacification of the left maxillary sinus with large retention cysts and partial opacification of ethmoid air cells. Chronic inflammatory changes of right maxillary sinus with atelectasis. Orbits are unremarkable.  Other: None. MRA HEAD FINDINGS Internal carotid arteries: Patent. Mild irregularity of right greater than left paraclinoid internal carotid arteries without significant stenosis probably representing intracranial atherosclerosis. Anterior cerebral arteries:  Patent. Middle cerebral arteries: Patent. Mild irregularity of right M1 segment without significant stenosis probably representing atherosclerotic disease. Anterior communicating artery: Patent. Posterior communicating arteries: Patent. Large posterior communicating artery is diminutive P1 segments compatible with persistent fetal circulation bilaterally. Posterior cerebral arteries:  Patent. Basilar artery:  Patent. Vertebral arteries:  Patent. No evidence of high-grade stenosis, large vessel occlusion, or aneurysm. IMPRESSION: 1. Several scattered subcentimeter foci of diffusion restriction within left parietal cortex consistent with acute to early subacute infarction, probably representing embolic phenomenon. No hemorrhage identified. 2. Extensive paranasal sinus disease greatest in left maxillary sinus. 3. Patent circle of Willis without evidence for large vessel occlusion, high-grade stenosis, or aneurysm. 4. Intracranial atherosclerosis with non stenotic mild irregularity of  right M1 and bilateral paraclinoid ICA. These results were called by telephone at the time of interpretation on 06/09/2016 at 6:55 pm to Dr. Hinda Kehr , who verbally acknowledged these results. Electronically Signed   By: Kristine Garbe M.D.   On: 06/09/2016 18:58   Mr Brain Wo Contrast  Result Date: 06/09/2016 CLINICAL DATA:  71 y/o F; severe vertigo, gait instability, history of TIA. EXAM: MRI HEAD WITHOUT CONTRAST MRA HEAD WITHOUT CONTRAST TECHNIQUE: Multiplanar, multiecho pulse sequences of the brain and surrounding structures were obtained without intravenous contrast. Angiographic images of the head were obtained using MRA technique without contrast. COMPARISON:  06/09/2016 CT head. MRI and MRA of head dated 04/11/2016. FINDINGS: MRI HEAD FINDINGS Brain: Scattered subcentimeter foci of diffusion restriction within the left parietal cortex consistent with acute to early subacute infarction. Several small foci of T2 FLAIR hyperintensity in the left frontal lobe correspond to areas of acute ischemia on prior MRI in are compatible with encephalomalacia sequelae of chronic infarction. No abnormal susceptibility hypointensity to indicate intracranial hemorrhage. No focal mass effect. No extra-axial collection. No hydrocephalus. Vascular: As below. Skull and upper cervical spine: Partially visualize cervical spondylosis in anterior cervical discectomy and fusion probably moderate canal stenosis at C3-4 and C4-5. Calvarium is unremarkable. Sinuses/Orbits: Complete opacification of the left maxillary sinus with large retention cysts and partial opacification of ethmoid air cells. Chronic inflammatory changes of right maxillary sinus with atelectasis. Orbits are unremarkable. Other: None. MRA HEAD FINDINGS Internal carotid arteries: Patent. Mild irregularity of right greater than left paraclinoid internal carotid arteries without significant stenosis probably representing intracranial atherosclerosis.  Anterior cerebral arteries:  Patent. Middle cerebral arteries: Patent. Mild irregularity of right M1 segment without significant stenosis probably representing atherosclerotic disease. Anterior communicating artery: Patent. Posterior communicating arteries: Patent. Large posterior communicating artery is diminutive P1 segments compatible with persistent fetal circulation bilaterally. Posterior cerebral arteries:  Patent. Basilar artery:  Patent. Vertebral arteries:  Patent. No evidence of high-grade stenosis, large vessel occlusion, or aneurysm. IMPRESSION: 1. Several scattered subcentimeter foci of diffusion restriction within left parietal cortex consistent with acute to early subacute infarction, probably representing embolic phenomenon. No hemorrhage identified. 2. Extensive paranasal sinus disease greatest in left maxillary sinus. 3. Patent circle of Willis without evidence for large vessel occlusion, high-grade stenosis, or aneurysm. 4. Intracranial atherosclerosis with non stenotic mild irregularity of right M1 and bilateral paraclinoid ICA. These results were called by telephone at the time of interpretation on 06/09/2016 at 6:55 pm to Dr. Hinda Kehr , who verbally acknowledged these results. Electronically Signed   By: Kristine Garbe  M.D.   On: 06/09/2016 18:58    ____________________________________________   PROCEDURES  Procedure(s) performed:   Procedures   Critical Care performed: No ____________________________________________   INITIAL IMPRESSION / ASSESSMENT AND PLAN / ED COURSE  Pertinent labs & imaging results that were available during my care of the patient were reviewed by me and considered in my medical decision making (see chart for details).  The patient has a history of TIA with multiple risk factors and has new onset gait instability.  She cannot stand up on her own without having additional support.  She has no dysmetria but I am concerned about cerebellar  infarction.  I will obtain MR brain without contrast and MRA head without contrast for further evaluation.  The patient will require some anxiolysis so I am giving her Valium 5 mg by mouth now which may also help with vertigo and hopefully will be sufficient to help her get through the MR.  Her lab work is reassuring and her vitals are notable only for her most recent pressure measurement being elevated, although her triage blood pressure was acceptable.   Clinical Course as of Jun 09 1913  Thu Jun 09, 2016  [id number] Received a call from radiology who reported acute vs subacute areas of infarction on MRI, probably multiple emboli.  Will inform patient and admit to hospitalist.  [CF]  1910 Giving full dose ASA; though she is on Plavix, she will likely benefit from dual antiplatelet therapy, and a single full dose ASA should not be contraindicated even if she does not continue on the aspirin.  I called back to Dr. Toney Reil to ask him about the possibility of dural sinus thrombosis or cavernous sinus thrombosis in the setting of the sinus disease that he noted on her imaging.  He feels that the pattern of infarction he has seen today is not consistent with either of these conditions and he thinks that is very unlikely given the chronic nature of the sinus disease.  Additionally the patient has had no headache which is reassuring.  At this point she does not need additional contrasted imaging for further evaluation.  [CF]  1914 Spoke with hospitalists in person (Dr. Leslye Peer and Dr. Fritzi Mandes) who will admit.  [CF]    Clinical Course User Index [CF] Hinda Kehr, MD    ____________________________________________  FINAL CLINICAL IMPRESSION(S) / ED DIAGNOSES  Final diagnoses:  Cerebrovascular accident (CVA) due to embolism of cerebral artery ([place])     MEDICATIONS GIVEN DURING THIS VISIT:  Medications  aspirin chewable tablet 324 mg (not administered)  diazepam (VALIUM) tablet 5 mg (5 mg Oral Given  06/09/16 1721)     NEW OUTPATIENT MEDICATIONS STARTED DURING THIS VISIT:  New Prescriptions   No medications on file    Modified Medications   No medications on file    Discontinued Medications   No medications on file     Note:  This document was prepared using Dragon voice recognition software and may include unintentional dictation errors.    Hinda Kehr, MD 06/09/16 [id number]

## 2016-06-09 NOTE — H&P (Signed)
[place] [facility] [place] NAME: Kimberly Trevino    MR#:  [id number]  DATE OF BIRTH:  April 09, 1946  DATE OF ADMISSION:  06/09/2016  PRIMARY CARE PHYSICIAN: Mable Paris, FNP   REQUESTING/REFERRING PHYSICIAN: Dr. Karma Greaser  CHIEF COMPLAINT:  Dizziness since Sunday and unsteady gait.  HISTORY OF PRESENT ILLNESS:  Kimberly Trevino  is a 71 y.o. 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 She was feeling lack of energy malaise and fatigue. Denies any dysphagia dysarthria visual symptoms or any focal weakness. In the emergency room workup included CT of the head that was negative given her symptoms of recent stroke and MRI was done which showed possible embolic click stroke in the in the left parietal cortex. No hemorrhage noted. Patient will be patient will be admitted for further discussion management. I will start her on aspirin. Continue Plavix. We'll get neurology and cardiac consultation morning.  PAST MEDICAL HISTORY:   Past Medical History:  Diagnosis Date  . Coronary artery disease 2011   Cardiac cath in 2011 at [facility]: 50% mid LAD stenosis without evidence of obstructive disease. Normal ejection fraction. Nuclear stress test done in October of 2013 4 atypical chest pain was normal.  . Hyperlipidemia   . Hypertension   . PSVT (paroxysmal supraventricular tachycardia) ([place])   . Tachycardia 2010  . TIA (transient ischemic attack)     PAST SURGICAL HISTOIRY:   Past Surgical History:  Procedure Laterality Date  . ABDOMINAL HYSTERECTOMY    . CARDIAC CATHETERIZATION  2011   50% to the mid LAD lesion  . PARTIAL HYSTERECTOMY  1999  . TONSILLECTOMY      SOCIAL HISTORY:   Social History  Substance Use Topics  . Smoking status: Never Smoker  .  Smokeless tobacco: Never Used  . Alcohol use No    FAMILY HISTORY:   Family History  Problem Relation Age of Onset  . Hypertension Mother   . Heart attack Father 41    MI  . Heart disease Father   . Hypertension Father   . Hypertension Sister   . Hyperlipidemia Sister   . Heart attack Brother     cardiac arrest  . Hypertension Sister   . Hyperlipidemia Sister   . Breast cancer Neg Hx     DRUG ALLERGIES:   Allergies  Allergen Reactions  . Sulfa Antibiotics     REVIEW OF SYSTEMS:  Review of Systems  Constitutional: Negative for chills, fever and weight loss.  HENT: Negative for ear discharge, ear pain and nosebleeds.   Eyes: Negative for blurred vision, pain and discharge.  Respiratory: Negative for sputum production, shortness of breath, wheezing and stridor.   Cardiovascular: Negative for chest pain, palpitations, orthopnea and PND.  Gastrointestinal: Negative for abdominal pain, diarrhea, nausea and vomiting.  Genitourinary: Negative for frequency and urgency.  Musculoskeletal: Negative for back pain and joint pain.  Neurological: Positive for dizziness and weakness. Negative for sensory change, speech change and focal weakness.  Psychiatric/Behavioral: Negative for depression and hallucinations. The patient is not nervous/anxious.      MEDICATIONS AT HOME:   Prior to Admission medications   Medication Sig Start Date End Date Taking? Authorizing Provider  CARTIA XT 240 MG 24 hr capsule Take 240 mg by mouth daily. 03/29/16  Yes Historical Provider, MD  Cholecalciferol (VITAMIN D PO) Take by mouth daily.   Yes Historical Provider, MD  clopidogrel (PLAVIX) 75 MG tablet Take 1 tablet (75 mg total) by mouth daily. 04/18/16  Yes Ryan M [name], PA-C  estradiol (ESTRACE) 0.5 MG tablet Take 0.5 mg by mouth daily.   Yes Historical Provider, MD  fluocinonide cream (LIDEX) [id number] % Apply 1 application topically as needed.  07/08/13  Yes Historical Provider, MD  lisinopril  (PRINIVIL,ZESTRIL) 20 MG tablet Take 20 mg by mouth daily.   Yes Historical Provider, MD  loratadine (CLARITIN) 10 MG tablet Take 10 mg by mouth daily.   Yes Historical Provider, MD  metoprolol succinate (TOPROL-XL) 100 MG 24 hr tablet Take 100 mg by mouth daily. Take with or immediately following a meal.   Yes Historical Provider, MD  Misc Natural Products (GINKOGIN PO) Take by mouth daily.   Yes Historical Provider, MD  Multiple Vitamin (MULTIVITAMIN) tablet Take 1 tablet by mouth daily.   Yes Historical Provider, MD  mupirocin ointment (BACTROBAN) 2 % APPLY TOPICALLY THREE TIMES DAILY 04/06/16  Yes Historical Provider, MD  oxybutynin (DITROPAN) 5 MG tablet Take 5 mg by mouth daily.   Yes Historical Provider, MD  rosuvastatin (CRESTOR) 5 MG tablet Take 5 mg by mouth daily.   Yes Historical Provider, MD  Valerian Root 100 MG CAPS Take 100 mg by mouth at bedtime.   Yes Historical Provider, MD  VENTOLIN HFA 108 (90 BASE) MCG/ACT inhaler Inhale 2 puffs into the lungs every 4 (four) hours as needed.  06/08/13  Yes Historical Provider, MD  vitamin C (ASCORBIC ACID) 500 MG tablet Take 500 mg by mouth daily.   Yes Historical Provider, MD  rosuvastatin (CRESTOR) 10 MG tablet Take 1 tablet (10 mg total) by mouth daily at 6 PM. 05/30/16   Wellington Hampshire, MD      VITAL SIGNS:  Blood pressure (!) 180/78, pulse 81, temperature 98 F (36.7 C), temperature source Oral, resp. rate 17, height 5\' 2"  (1.575 m), weight 78.5 kg (173 lb), SpO2 98 %.  PHYSICAL EXAMINATION:  GENERAL:  71 y.o.-year-old patient lying in the bed with no acute distress.  EYES: Pupils equal, round, reactive to light and accommodation. No scleral icterus. Extraocular muscles intact.  HEENT: Head atraumatic, normocephalic. Oropharynx and nasopharynx clear.  NECK:  Supple, no jugular venous distention. No thyroid enlargement, no tenderness.  LUNGS: Normal breath sounds bilaterally, no wheezing, rales,rhonchi or crepitation. No use of  accessory muscles of respiration.  CARDIOVASCULAR: S1, S2 normal. No murmurs, rubs, or gallops.  ABDOMEN: Soft, nontender, nondistended. Bowel sounds present. No organomegaly or mass.  EXTREMITIES: No pedal edema, cyanosis, or clubbing.  NEUROLOGIC: Cranial nerves II through XII are intact. Muscle strength 5/5 in all extremities. Sensation intact. Gait not checked. No focal weakness. PSYCHIATRIC: The patient is alert and oriented x 3.  SKIN: No obvious rash, lesion, or ulcer.   LABORATORY PANEL:   CBC  Recent Labs Lab 06/09/16 1350  WBC 3.8  HGB 13.5  HCT 38.1  PLT 302   ------------------------------------------------------------------------------------------------------------------  Chemistries   Recent Labs Lab 06/09/16 1350  NA 139  K 3.2*  CL 107  CO2 24  GLUCOSE 99  BUN 15  CREATININE 0.72  CALCIUM 9.0  AST 27  ALT 24  ALKPHOS 60  BILITOT 1.0   ------------------------------------------------------------------------------------------------------------------  Cardiac Enzymes  Recent Labs Lab 06/09/16 1350  TROPONINI <0.03   ------------------------------------------------------------------------------------------------------------------  RADIOLOGY:  Ct Head Wo Contrast  Result Date: 06/09/2016 CLINICAL DATA:  Dizziness for several days, nausea and weakness EXAM: CT HEAD WITHOUT CONTRAST TECHNIQUE: Contiguous axial images were obtained from the base of the skull through the vertex without intravenous contrast. COMPARISON:  MR brain of 04/11/2016 and CT brain of 04/10/2016 FINDINGS: Brain: The ventricular system is within normal limits in size and the septum is midline in position. The fourth ventricle and basilar cisterns are unremarkable. No hemorrhage, mass lesion, or acute infarction is seen. Vascular: No vascular abnormality is seen on this unenhanced study. Skull: On bone window images, no calvarial abnormality is seen. Sinuses/Orbits: There is a  retention cyst within the left maxillary sinus. Opacification of a few ethmoid air cells is noted. Otherwise the paranasal sinuses are well pneumatized. Other: None. IMPRESSION: 1. Negative unenhanced CT of the brain. 2. Probable retention cysts in the left maxillary sinus with some opacification of a few ethmoid air cells. Electronically Signed   By: Ivar Drape M.D.   On: 06/09/2016 13:35   Mr Angiogram Head Wo Contrast  Result Date: 06/09/2016 CLINICAL DATA:  71 y/o F; severe vertigo, gait instability, history of TIA. EXAM: MRI HEAD WITHOUT CONTRAST MRA HEAD WITHOUT CONTRAST TECHNIQUE: Multiplanar, multiecho pulse sequences of the brain and surrounding structures were obtained without intravenous contrast. Angiographic images of the head were obtained using MRA technique without contrast. COMPARISON:  06/09/2016 CT head. MRI and MRA of head dated 04/11/2016. FINDINGS: MRI HEAD FINDINGS Brain: Scattered subcentimeter foci of diffusion restriction within the left parietal cortex consistent with acute to early subacute infarction. Several small foci of T2 FLAIR hyperintensity in the left frontal lobe correspond to areas of acute ischemia on prior MRI in are compatible with encephalomalacia sequelae of chronic infarction. No abnormal susceptibility hypointensity to indicate intracranial hemorrhage. No focal mass effect. No extra-axial collection. No hydrocephalus. Vascular: As below. Skull and upper cervical spine: Partially visualize cervical spondylosis in anterior cervical discectomy and fusion probably moderate canal stenosis at C3-4 and C4-5. Calvarium is unremarkable. Sinuses/Orbits: Complete opacification of the left maxillary sinus with large retention cysts and partial opacification of ethmoid air cells. Chronic inflammatory changes of right maxillary sinus with atelectasis. Orbits are unremarkable. Other: None. MRA HEAD FINDINGS Internal carotid arteries: Patent. Mild irregularity of right greater than  left paraclinoid internal carotid arteries without significant stenosis probably representing intracranial atherosclerosis. Anterior cerebral arteries:  Patent. Middle cerebral arteries: Patent. Mild irregularity of right M1 segment without significant stenosis probably representing atherosclerotic disease. Anterior communicating artery: Patent. Posterior communicating arteries: Patent. Large posterior communicating artery is diminutive P1 segments compatible with persistent fetal circulation bilaterally. Posterior cerebral arteries:  Patent. Basilar artery:  Patent. Vertebral arteries:  Patent. No evidence of high-grade stenosis, large vessel occlusion, or aneurysm. IMPRESSION: 1. Several scattered subcentimeter foci of diffusion restriction within left parietal cortex consistent with acute to early subacute infarction, probably representing embolic phenomenon. No hemorrhage identified. 2. Extensive paranasal sinus disease greatest in left maxillary sinus. 3. Patent circle of Willis without evidence for large vessel occlusion, high-grade stenosis, or aneurysm. 4. Intracranial atherosclerosis with non stenotic mild irregularity of right M1 and bilateral paraclinoid ICA. These results were called by telephone at the time of interpretation on 06/09/2016 at 6:55 pm to Dr. Hinda Kehr , who verbally acknowledged these results. Electronically Signed   By: Kristine Garbe M.D.   On: 06/09/2016 18:58   Mr Brain Wo Contrast  Result Date: 06/09/2016 CLINICAL DATA:  71 y/o F; severe vertigo, gait instability, history of TIA.  EXAM: MRI HEAD WITHOUT CONTRAST MRA HEAD WITHOUT CONTRAST TECHNIQUE: Multiplanar, multiecho pulse sequences of the brain and surrounding structures were obtained without intravenous contrast. Angiographic images of the head were obtained using MRA technique without contrast. COMPARISON:  06/09/2016 CT head. MRI and MRA of head dated 04/11/2016. FINDINGS: MRI HEAD FINDINGS Brain: Scattered  subcentimeter foci of diffusion restriction within the left parietal cortex consistent with acute to early subacute infarction. Several small foci of T2 FLAIR hyperintensity in the left frontal lobe correspond to areas of acute ischemia on prior MRI in are compatible with encephalomalacia sequelae of chronic infarction. No abnormal susceptibility hypointensity to indicate intracranial hemorrhage. No focal mass effect. No extra-axial collection. No hydrocephalus. Vascular: As below. Skull and upper cervical spine: Partially visualize cervical spondylosis in anterior cervical discectomy and fusion probably moderate canal stenosis at C3-4 and C4-5. Calvarium is unremarkable. Sinuses/Orbits: Complete opacification of the left maxillary sinus with large retention cysts and partial opacification of ethmoid air cells. Chronic inflammatory changes of right maxillary sinus with atelectasis. Orbits are unremarkable. Other: None. MRA HEAD FINDINGS Internal carotid arteries: Patent. Mild irregularity of right greater than left paraclinoid internal carotid arteries without significant stenosis probably representing intracranial atherosclerosis. Anterior cerebral arteries:  Patent. Middle cerebral arteries: Patent. Mild irregularity of right M1 segment without significant stenosis probably representing atherosclerotic disease. Anterior communicating artery: Patent. Posterior communicating arteries: Patent. Large posterior communicating artery is diminutive P1 segments compatible with persistent fetal circulation bilaterally. Posterior cerebral arteries:  Patent. Basilar artery:  Patent. Vertebral arteries:  Patent. No evidence of high-grade stenosis, large vessel occlusion, or aneurysm. IMPRESSION: 1. Several scattered subcentimeter foci of diffusion restriction within left parietal cortex consistent with acute to early subacute infarction, probably representing embolic phenomenon. No hemorrhage identified. 2. Extensive paranasal  sinus disease greatest in left maxillary sinus. 3. Patent circle of Willis without evidence for large vessel occlusion, high-grade stenosis, or aneurysm. 4. Intracranial atherosclerosis with non stenotic mild irregularity of right M1 and bilateral paraclinoid ICA. These results were called by telephone at the time of interpretation on 06/09/2016 at 6:55 pm to Dr. Hinda Kehr , who verbally acknowledged these results. Electronically Signed   By: Kristine Garbe M.D.   On: 06/09/2016 18:58    EKG:  Normal sinus rhythm, left axis deviation.  IMPRESSION AND PLAN:   Kimberly Trevino  is a 71 y.o. 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 She was feeling lack of energy malaise and fatigue. Denies any dysphagia dysarthria visual symptoms or any focal weakness.  1.Acute left parietal stroke appears embolic possible. Patient recently had left frontal stroke in November 2017. She was on Plavix. -Admit to medical floor -Cardiac monitoring, neuro checks every 4 hourly, RN to do swallow study test. -If patient passes swallow test put her on regular diet -Neurology consultation for recurrent stroke questionable embolic, ? Oral anticoagulation will defer to neurology Cardiac consultation for possible TEE -Continue Plavix I will add aspirin -Continue statins  2. Hypertension allow permissive hypertension  -Resume home meds   3. Hyperlipidemia on Crestor  4. History of PSVT -Patient recently had a 30 day Holter monitor which did not show any evidence of A. fib according to Dr. Fletcher Anon -EKG shows normal sinus rhythm  5. DVT prophylaxis Lovenox  Patient wanted to get transferred to [facility].  Cardiac currently although tertiary care centers are under diversion. ER physician spoke with family and patient are  agreeable to see patient in the hospital.    All the records are reviewed and case discussed with ED provider. Management plans discussed with the patient, family and they are in agreement.  CODE STATUS: FULL  TOTAL TIME TAKING CARE OF THIS PATIENT: 50 minutes.    Texas Oborn M.D on 06/09/2016 at 8:01 PM  Between 7am to 6pm - Pager - [phone]  After 6pm go to [email or web address] - password [email or web address] [facility]  Office  [phone]  CC: Primary care physician; Mable Paris, FNP

## 2016-06-09 NOTE — ED Notes (Signed)
Per verbal order from Dr. Alfred Levins do not call code stroke at this time. Symptoms began Sunday. Neuro exam is unremarkable other than dizziness.

## 2016-06-09 NOTE — Progress Notes (Signed)
Family Meeting Note  Advance Directive:no  Today a meeting took place with the son, pt and MD   Discussed with patient and son regarding her recurrent embolic stroke. Patient will be admitted for further aggressive management. CODE STATUS addressed. Patient wants full code. Son in agreement. Time spent during discussion:16 mins  Kimberly Pelham, MD

## 2016-06-09 NOTE — ED Triage Notes (Signed)
Patient presents to ED via POV in wheelchair from home with c/o dizziness that began on Sunday. Hx of TIA and was admitted in November. Patient c/o nausea and generalized weakness. Face is symmetrical. Equal and symmetrical hand grasps. A&O x4 .

## 2016-06-10 ENCOUNTER — Encounter: Payer: Self-pay | Admitting: Radiology

## 2016-06-10 ENCOUNTER — Inpatient Hospital Stay: Payer: Medicare Other

## 2016-06-10 LAB — LIPID PANEL
CHOL/HDL RATIO: 4.2 ratio
Cholesterol: 134 mg/dL (ref 0–200)
HDL: 32 mg/dL — AB (ref >40)
LDL CALC: 52 mg/dL (ref 0–99)
Triglycerides: 251 mg/dL — ABNORMAL HIGH (ref <150)
VLDL: 50 mg/dL — ABNORMAL HIGH (ref 0–40)

## 2016-06-10 MED ORDER — GUAIFENESIN-DM 100-10 MG/5ML PO SYRP
5.0000 mL | ORAL_SOLUTION | ORAL | Status: DC | PRN
  Administered 2016-06-10 – 2016-06-12 (×2): 5 mL via ORAL
  Filled 2016-06-10 (×2): qty 5

## 2016-06-10 MED ORDER — IOPAMIDOL (ISOVUE-370) INJECTION 76%
75.0000 mL | Freq: Once | INTRAVENOUS | Status: AC | PRN
  Administered 2016-06-10: 15:00:00 75 mL via INTRAVENOUS

## 2016-06-10 NOTE — Care Management (Signed)
Admitted to [facility] with the diagnosis of CVA. Lives with husband, Marnell x 12 years. [phone] or [phone]). Appointment arranged with Dr. Ginette Pitman 07/28/16. No equipment in the home. Works at [employer]. Takes care of all basic and instrumental activities of daily living herself, drives. Gets off balance when walking at times. Good appetite. Prescriptions are filled at [facility] on [address] and mail order. No home health. No skilled facility. Physical therapy evaluation completed. No follow-up needs recommended.                                                                                                Shelbie Ammons RN MSN CCM Care Management

## 2016-06-10 NOTE — Progress Notes (Signed)
    [facility] has been requested to perform a transesophageal echocardiogram on Kimberly Trevino for emoblic CVA.  After careful review of history and examination, the risks and benefits of transesophageal echocardiogram have been explained including risks of esophageal damage, perforation (1:10,000 risk), bleeding, pharyngeal hematoma as well as other potential complications associated with conscious sedation including aspiration, arrhythmia, respiratory failure and death. Alternatives to treatment were discussed, questions were answered. Patient is willing to proceed.   This will be scheduled for Monday 1/29.  Murray Hodgkins, NP  06/10/2016 11:48 AM

## 2016-06-10 NOTE — Progress Notes (Signed)
[facility] NAME: Kimberly Trevino    MR#:  [id number]  DATE OF BIRTH:  1946-04-24  SUBJECTIVE:  CHIEF COMPLAINT:   Chief Complaint  Patient presents with  . Dizziness  still dizziness, son at bedside REVIEW OF SYSTEMS:  Review of Systems  Constitutional: Positive for malaise/fatigue. Negative for chills, fever and weight loss.  HENT: Negative for nosebleeds and sore throat.   Eyes: Negative for blurred vision.  Respiratory: Negative for cough, shortness of breath and wheezing.   Cardiovascular: Negative for chest pain, orthopnea, leg swelling and PND.  Gastrointestinal: Negative for abdominal pain, constipation, diarrhea, heartburn, nausea and vomiting.  Genitourinary: Negative for dysuria and urgency.  Musculoskeletal: Negative for back pain.  Skin: Negative for rash.  Neurological: Positive for dizziness and weakness. Negative for speech change, focal weakness and headaches.  Endo/Heme/Allergies: Does not bruise/bleed easily.  Psychiatric/Behavioral: Negative for depression.   DRUG ALLERGIES:   Allergies  Allergen Reactions  . Sulfa Antibiotics    VITALS:  Blood pressure 139/62, pulse 74, temperature 98.5 F (36.9 C), temperature source Oral, resp. rate (!) 22, height 5\' 2"  (1.575 m), weight 78.5 kg (173 lb), SpO2 97 %. PHYSICAL EXAMINATION:  Physical Exam  Constitutional: She is oriented to person, place, and time and well-developed, well-nourished, and in no distress.  HENT:  Head: Normocephalic and atraumatic.  Eyes: Conjunctivae and EOM are normal. Pupils are equal, round, and reactive to light.  Neck: Normal range of motion. Neck supple. No tracheal deviation present. No thyromegaly present.  Cardiovascular: Normal rate, regular rhythm and normal heart sounds.   Pulmonary/Chest: Effort normal and breath sounds normal. No respiratory distress. She has no wheezes. She exhibits no tenderness.  Abdominal: Soft. Bowel  sounds are normal. She exhibits no distension. There is no tenderness.  Musculoskeletal: Normal range of motion.  Neurological: She is alert and oriented to person, place, and time. No cranial nerve deficit.  Skin: Skin is warm and dry. No rash noted.  Psychiatric: Mood and affect normal.   LABORATORY PANEL:   CBC  Recent Labs Lab 06/09/16 1350  WBC 3.8  HGB 13.5  HCT 38.1  PLT 302   ------------------------------------------------------------------------------------------------------------------ Chemistries   Recent Labs Lab 06/09/16 1350  NA 139  K 3.2*  CL 107  CO2 24  GLUCOSE 99  BUN 15  CREATININE 0.72  CALCIUM 9.0  AST 27  ALT 24  ALKPHOS 60  BILITOT 1.0   RADIOLOGY:  Mr Angiogram Head Wo Contrast  Result Date: 06/09/2016 CLINICAL DATA:  71 y/o F; severe vertigo, gait instability, history of TIA. EXAM: MRI HEAD WITHOUT CONTRAST MRA HEAD WITHOUT CONTRAST TECHNIQUE: Multiplanar, multiecho pulse sequences of the brain and surrounding structures were obtained without intravenous contrast. Angiographic images of the head were obtained using MRA technique without contrast. COMPARISON:  06/09/2016 CT head. MRI and MRA of head dated 04/11/2016. FINDINGS: MRI HEAD FINDINGS Brain: Scattered subcentimeter foci of diffusion restriction within the left parietal cortex consistent with acute to early subacute infarction. Several small foci of T2 FLAIR hyperintensity in the left frontal lobe correspond to areas of acute ischemia on prior MRI in are compatible with encephalomalacia sequelae of chronic infarction. No abnormal susceptibility hypointensity to indicate intracranial hemorrhage. No focal mass effect. No extra-axial collection. No hydrocephalus. Vascular: As below. Skull and upper cervical spine: Partially visualize cervical spondylosis in anterior cervical discectomy and fusion probably moderate canal stenosis at C3-4 and C4-5. Calvarium is unremarkable. Sinuses/Orbits:  Complete opacification of the left maxillary sinus with large retention cysts and partial opacification of ethmoid air cells. Chronic inflammatory changes of right maxillary sinus with atelectasis. Orbits are unremarkable. Other: None. MRA HEAD FINDINGS Internal carotid arteries: Patent. Mild irregularity of right greater than left paraclinoid internal carotid arteries without significant stenosis probably representing intracranial atherosclerosis. Anterior cerebral arteries:  Patent. Middle cerebral arteries: Patent. Mild irregularity of right M1 segment without significant stenosis probably representing atherosclerotic disease. Anterior communicating artery: Patent. Posterior communicating arteries: Patent. Large posterior communicating artery is diminutive P1 segments compatible with persistent fetal circulation bilaterally. Posterior cerebral arteries:  Patent. Basilar artery:  Patent. Vertebral arteries:  Patent. No evidence of high-grade stenosis, large vessel occlusion, or aneurysm. IMPRESSION: 1. Several scattered subcentimeter foci of diffusion restriction within left parietal cortex consistent with acute to early subacute infarction, probably representing embolic phenomenon. No hemorrhage identified. 2. Extensive paranasal sinus disease greatest in left maxillary sinus. 3. Patent circle of Willis without evidence for large vessel occlusion, high-grade stenosis, or aneurysm. 4. Intracranial atherosclerosis with non stenotic mild irregularity of right M1 and bilateral paraclinoid ICA. These results were called by telephone at the time of interpretation on 06/09/2016 at 6:55 pm to Dr. Hinda Kehr , who verbally acknowledged these results. Electronically Signed   By: Kristine Garbe M.D.   On: 06/09/2016 18:58   Mr Brain Wo Contrast  Result Date: 06/09/2016 CLINICAL DATA:  71 y/o F; severe vertigo, gait instability, history of TIA. EXAM: MRI HEAD WITHOUT CONTRAST MRA HEAD WITHOUT CONTRAST  TECHNIQUE: Multiplanar, multiecho pulse sequences of the brain and surrounding structures were obtained without intravenous contrast. Angiographic images of the head were obtained using MRA technique without contrast. COMPARISON:  06/09/2016 CT head. MRI and MRA of head dated 04/11/2016. FINDINGS: MRI HEAD FINDINGS Brain: Scattered subcentimeter foci of diffusion restriction within the left parietal cortex consistent with acute to early subacute infarction. Several small foci of T2 FLAIR hyperintensity in the left frontal lobe correspond to areas of acute ischemia on prior MRI in are compatible with encephalomalacia sequelae of chronic infarction. No abnormal susceptibility hypointensity to indicate intracranial hemorrhage. No focal mass effect. No extra-axial collection. No hydrocephalus. Vascular: As below. Skull and upper cervical spine: Partially visualize cervical spondylosis in anterior cervical discectomy and fusion probably moderate canal stenosis at C3-4 and C4-5. Calvarium is unremarkable. Sinuses/Orbits: Complete opacification of the left maxillary sinus with large retention cysts and partial opacification of ethmoid air cells. Chronic inflammatory changes of right maxillary sinus with atelectasis. Orbits are unremarkable. Other: None. MRA HEAD FINDINGS Internal carotid arteries: Patent. Mild irregularity of right greater than left paraclinoid internal carotid arteries without significant stenosis probably representing intracranial atherosclerosis. Anterior cerebral arteries:  Patent. Middle cerebral arteries: Patent. Mild irregularity of right M1 segment without significant stenosis probably representing atherosclerotic disease. Anterior communicating artery: Patent. Posterior communicating arteries: Patent. Large posterior communicating artery is diminutive P1 segments compatible with persistent fetal circulation bilaterally. Posterior cerebral arteries:  Patent. Basilar artery:  Patent. Vertebral  arteries:  Patent. No evidence of high-grade stenosis, large vessel occlusion, or aneurysm. IMPRESSION: 1. Several scattered subcentimeter foci of diffusion restriction within left parietal cortex consistent with acute to early subacute infarction, probably representing embolic phenomenon. No hemorrhage identified. 2. Extensive paranasal sinus disease greatest in left maxillary sinus. 3. Patent circle of Willis without evidence for large vessel occlusion, high-grade stenosis, or aneurysm. 4. Intracranial atherosclerosis with non stenotic mild irregularity of right M1 and bilateral paraclinoid ICA. These results were called  by telephone at the time of interpretation on 06/09/2016 at 6:55 pm to Dr. Hinda Kehr , who verbally acknowledged these results. Electronically Signed   By: Kristine Garbe M.D.   On: 06/09/2016 18:58   US Carotid Bilateral  Result Date: 06/10/2016 CLINICAL DATA:  Stroke.  History of hypertension and hyperlipidemia. EXAM: BILATERAL CAROTID DUPLEX ULTRASOUND TECHNIQUE: Pearline Cables scale imaging, color Doppler and duplex ultrasound were performed of bilateral carotid and vertebral arteries in the neck. COMPARISON:  04/10/2016 FINDINGS: Criteria: Quantification of carotid stenosis is based on velocity parameters that correlate the residual internal carotid diameter with NASCET-based stenosis levels, using the diameter of the distal internal carotid lumen as the denominator for stenosis measurement. The following velocity measurements were obtained: RIGHT ICA:  98/13 cm/sec CCA:  [id number] cm/sec SYSTOLIC ICA/CCA RATIO:  1.0 DIASTOLIC ICA/CCA RATIO:  0.9 ECA:  79 cm/sec LEFT ICA:  102/24 cm/sec CCA:  [id number] cm/sec SYSTOLIC ICA/CCA RATIO:  1.0 DIASTOLIC ICA/CCA RATIO:  1.3 ECA:  150 cm/sec RIGHT CAROTID ARTERY: There is a minimal amount of eccentric mixed echogenic plaque within the right carotid bulb (images 14 and 16), extending to involve the origin and proximal aspect of the right internal  carotid artery (image 24), morphologically similar to the 03/2016 examination and again not resulting in elevated peak systolic velocities within the interrogated course of the right internal carotid artery to suggest a hemodynamically significant stenosis. RIGHT VERTEBRAL ARTERY:  Antegrade Flow LEFT CAROTID ARTERY: There is a moderate amount of eccentric mixed echogenic plaque within the left carotid bulb (image 46), extending to involve the origin and proximal aspects of the left internal carotid artery (image 54), morphologically similar to the 03/2016 examination and again not resulting in elevated peak systolic velocities within the interrogated course the left internal carotid artery to suggest a hemodynamically significant stenosis. LEFT VERTEBRAL ARTERY:  Antegrade Flow IMPRESSION: Minimal to moderate amount of bilateral atherosclerotic plaque, left greater than right, morphologically similar to the 03/2016 examination and again not resulting in a hemodynamically significant stenosis within either internal carotid artery. Electronically Signed   By: Sandi Mariscal M.D.   On: 06/10/2016 15:02   ASSESSMENT AND PLAN:  Talishia Sauer  is a 71 y.o. 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 She was feeling lack of energy malaise and fatigue. Denies any dysphagia dysarthria visual symptoms or any focal weakness.  1.Acute left parietal stroke appears embolic possible Patient recently had left frontal stroke in November 2017.  - continue asa + Plavix + high intensity statin - Pending Neurology consultation for recurrent stroke questionable embolic, ? Oral anticoagulation will defer to neurology Cardiac consultation for possible TEE - will be done on Monday 1/29 per Cardio  2. Hypertension allow permissive  hypertension  -Resume home meds   3. Hyperlipidemia on Crestor  4. History of PSVT -Patient recently had a 30 day Holter monitor which did not show any evidence of A. fib according to Dr. Fletcher Anon -EKG shows normal sinus rhythm  5. Hypokalemia: - Replete and recheck   All the records are reviewed and case discussed with Care Management/Social Worker. Management plans discussed with the patient, family and they are in agreement.  CODE STATUS: FULL CODE  TOTAL TIME TAKING CARE OF THIS PATIENT: 35 minutes.   More than 50% of the time  was spent in counseling/coordination of care: YES  POSSIBLE D/C IN 1-2 DAYS, DEPENDING ON CLINICAL CONDITION. AND NEURO EVAL   Max Sane M.D on 06/10/2016 at 3:28 PM  Between 7am to 6pm - Pager - [phone]  After 6pm go to [email or web address] - Proofreader  [facility]  Office  [phone]  CC: Primary care physician; Mable Paris, FNP  Note: This dictation was prepared with Dragon dictation along with smaller phrase technology. Any transcriptional errors that result from this process are unintentional.

## 2016-06-10 NOTE — Consult Note (Signed)
Referring Physician: Manuella Ghazi    Chief Complaint: Dizziness  HPI: Kimberly Trevino is an 71 y.o. female who reports the onset of dizziness when awakening on Sunday.  The dizziness has continued since that time.  The patient has a history of vertigo so initially she was not concerned but with the continuation of the dizziness she presented for evaluation.  Dizziness is present when she is up and around but not present when lying down.   Initial NIHSS of 0.  Date last known well: Date: 06/04/2016 Time last known well: Time: 22:00 tPA Given: No: Outside time window  Past Medical History:  Diagnosis Date  . Asthma   . Coronary artery disease 2011   Cardiac cath in 2011 at [facility]: 50% mid LAD stenosis without evidence of obstructive disease. Normal ejection fraction. Nuclear stress test done in October of 2013 4 atypical chest pain was normal.  . Hyperlipidemia   . Hypertension   . PSVT (paroxysmal supraventricular tachycardia) ([place])   . Tachycardia 2010  . TIA (transient ischemic attack)     Past Surgical History:  Procedure Laterality Date  . ABDOMINAL HYSTERECTOMY    . CARDIAC CATHETERIZATION  2011   50% to the mid LAD lesion  . PARTIAL HYSTERECTOMY  1999  . TONSILLECTOMY      Family History  Problem Relation Age of Onset  . Hypertension Mother   . Heart attack Father 50    MI  . Heart disease Father   . Hypertension Father   . Hypertension Sister   . Hyperlipidemia Sister   . Heart attack Brother     cardiac arrest  . Hypertension Sister   . Hyperlipidemia Sister   . Breast cancer Neg Hx    Social History:  reports that she has never smoked. She has never used smokeless tobacco. She reports that she does not drink alcohol or use drugs.  Allergies:  Allergies  Allergen Reactions  . Sulfa Antibiotics     Medications:  I have reviewed the patient's current medications. Prior to Admission:  Prescriptions Prior to Admission  Medication Sig Dispense Refill Last Dose  .  CARTIA XT 240 MG 24 hr capsule Take 240 mg by mouth daily.   06/08/2016 at 2000  . Cholecalciferol (VITAMIN D PO) Take by mouth daily.   06/09/2016 at 0930  . clopidogrel (PLAVIX) 75 MG tablet Take 1 tablet (75 mg total) by mouth daily. 90 tablet 3 06/09/2016 at 0930  . estradiol (ESTRACE) 0.5 MG tablet Take 0.5 mg by mouth daily.   06/09/2016 at 0930  . fluocinonide cream (LIDEX) [id number] % Apply 1 application topically as needed.    prn at prn  . lisinopril (PRINIVIL,ZESTRIL) 20 MG tablet Take 20 mg by mouth daily.   06/08/2016 at 2000  . loratadine (CLARITIN) 10 MG tablet Take 10 mg by mouth daily.   06/09/2016 at 0930  . metoprolol succinate (TOPROL-XL) 100 MG 24 hr tablet Take 100 mg by mouth daily. Take with or immediately following a meal.   06/08/2016 at 2000  . Misc Natural Products (GINKOGIN PO) Take by mouth daily.   06/09/2016 at 0930  . Multiple Vitamin (MULTIVITAMIN) tablet Take 1 tablet by mouth daily.   06/09/2016 at 0930  . mupirocin ointment (BACTROBAN) 2 % APPLY TOPICALLY THREE TIMES DAILY   prn at prn  . oxybutynin (DITROPAN) 5 MG tablet Take 5 mg by mouth daily.   06/08/2016 at 2000  . rosuvastatin (CRESTOR) 5 MG tablet Take 5  mg by mouth daily.   06/08/2016 at 2000  . Valerian Root 100 MG CAPS Take 100 mg by mouth at bedtime.   prn at prn  . VENTOLIN HFA 108 (90 BASE) MCG/ACT inhaler Inhale 2 puffs into the lungs every 4 (four) hours as needed.    prn at prn  . vitamin C (ASCORBIC ACID) 500 MG tablet Take 500 mg by mouth daily.   06/09/2016 at 0930  . rosuvastatin (CRESTOR) 10 MG tablet Take 1 tablet (10 mg total) by mouth daily at 6 PM. 90 tablet 2    Scheduled: . aspirin EC  81 mg Oral Daily  . cholecalciferol  400 Units Oral Daily  . clopidogrel  75 mg Oral Daily  . diltiazem  240 mg Oral Daily  . enoxaparin (LOVENOX) injection  40 mg Subcutaneous Q24H  . lisinopril  20 mg Oral Daily  . loratadine  10 mg Oral Daily  . metoprolol succinate  100 mg Oral Daily  . multivitamin with  minerals  1 tablet Oral Daily  . oxybutynin  5 mg Oral Daily  . rosuvastatin  5 mg Oral Q24H  . senna  1 tablet Oral BID  . vitamin C  500 mg Oral Daily    ROS: History obtained from the patient  General ROS: negative for - chills, fatigue, fever, night sweats, weight gain or weight loss Psychological ROS: negative for - behavioral disorder, hallucinations, memory difficulties, mood swings or suicidal ideation Ophthalmic ROS: negative for - blurry vision, double vision, eye pain or loss of vision ENT ROS: negative for - epistaxis, nasal discharge, oral lesions, sore throat, tinnitus or vertigo Allergy and Immunology ROS: negative for - hives or itchy/watery eyes Hematological and Lymphatic ROS: negative for - bleeding problems, bruising or swollen lymph nodes Endocrine ROS: negative for - galactorrhea, hair pattern changes, polydipsia/polyuria or temperature intolerance Respiratory ROS: negative for - cough, hemoptysis, shortness of breath or wheezing Cardiovascular ROS: negative for - chest pain, dyspnea on exertion, edema or irregular heartbeat Gastrointestinal ROS: negative for - abdominal pain, diarrhea, hematemesis, nausea/vomiting or stool incontinence Genito-Urinary ROS: negative for - dysuria, hematuria, incontinence or urinary frequency/urgency Musculoskeletal ROS: negative for - joint swelling or muscular weakness Neurological ROS: as noted in HPI Dermatological ROS: negative for rash and skin lesion changes  Physical Examination: Blood pressure 131/86, pulse 88, temperature 98.8 F (37.1 C), temperature source Oral, resp. rate 18, height 5\' 2"  (1.575 m), weight 78.5 kg (173 lb), SpO2 95 %.  HEENT-  Normocephalic, no lesions, without obvious abnormality.  Normal external eye and conjunctiva.  Normal TM's bilaterally.  Normal auditory canals and external ears. Normal external nose, mucus membranes and septum.  Normal pharynx. Cardiovascular- S1, S2 normal, pulses palpable  throughout   Lungs- chest clear, no wheezing, rales, normal symmetric air entry Abdomen- soft, non-tender; bowel sounds normal; no masses,  no organomegaly Extremities- no edema Lymph-no adenopathy palpable Musculoskeletal-no joint tenderness, deformity or swelling Skin-warm and dry, no hyperpigmentation, vitiligo, or suspicious lesions  Neurological Examination Mental Status: Alert, oriented, thought content appropriate.  Speech fluent without evidence of aphasia.  Able to follow 3 step commands without difficulty. Cranial Nerves: II: Discs flat bilaterally; Visual fields grossly normal, pupils equal, round, reactive to light and accommodation III,IV, VI: ptosis not present, extra-ocular motions intact bilaterally V,VII: smile symmetric, facial light touch sensation normal bilaterally VIII: hearing normal bilaterally IX,X: gag reflex present XI: bilateral shoulder shrug XII: midline tongue extension Motor: Right : Upper extremity  5/5    Left:     Upper extremity   5/5  Lower extremity   5/5     Lower extremity   5/5 Tone and bulk:normal tone throughout; no atrophy noted Sensory: Pinprick and light touch intact throughout, bilaterally Deep Tendon Reflexes: 2+ and symmetric with absent AJ's bilaterally Plantars: Right: mute   Left: mute Cerebellar: Normal finger-to-nose and normal heel-to-shin testing bilaterally Gait: not tested due to safety concerns    Laboratory Studies:  Basic Metabolic Panel:  Recent Labs Lab 06/09/16 1350  NA 139  K 3.2*  CL 107  CO2 24  GLUCOSE 99  BUN 15  CREATININE 0.72  CALCIUM 9.0    Liver Function Tests:  Recent Labs Lab 06/09/16 1350  AST 27  ALT 24  ALKPHOS 60  BILITOT 1.0  PROT 7.4  ALBUMIN 4.5   No results for input(s): LIPASE, AMYLASE in the last 168 hours. No results for input(s): AMMONIA in the last 168 hours.  CBC:  Recent Labs Lab 06/09/16 1350  WBC 3.8  NEUTROABS 1.8  HGB 13.5  HCT 38.1  MCV 82.3  PLT  302    Cardiac Enzymes:  Recent Labs Lab 06/09/16 1350  TROPONINI <0.03    BNP: Invalid input(s): POCBNP  CBG: No results for input(s): GLUCAP in the last 168 hours.  Microbiology: No results found for this or any previous visit.  Coagulation Studies:  Recent Labs  06/09/16 1350  LABPROT 12.2  INR 0.91    Urinalysis: No results for input(s): COLORURINE, LABSPEC, PHURINE, GLUCOSEU, HGBUR, BILIRUBINUR, KETONESUR, PROTEINUR, UROBILINOGEN, NITRITE, LEUKOCYTESUR in the last 168 hours.  Invalid input(s): APPERANCEUR  Lipid Panel:    Component Value Date/Time   CHOL 134 06/10/2016 0420   CHOL 157 05/25/2016 0746   TRIG 251 (H) 06/10/2016 0420   HDL 32 (L) 06/10/2016 0420   HDL 40 05/25/2016 0746   CHOLHDL 4.2 06/10/2016 0420   VLDL 50 (H) 06/10/2016 0420   LDLCALC 52 06/10/2016 0420   LDLCALC 75 05/25/2016 0746    HgbA1C:  Lab Results  Component Value Date   HGBA1C 6.0 (H) 04/11/2016    Urine Drug Screen:  No results found for: LABOPIA, COCAINSCRNUR, LABBENZ, AMPHETMU, THCU, LABBARB  Alcohol Level: No results for input(s): ETH in the last 168 hours.  Other results: EKG: sinus rhythm at 76 bpm, LAD.  Imaging: Ct Angio Head W Or Wo Contrast  Result Date: 06/10/2016 CLINICAL DATA:  Dizziness and vertigo since Sunday. EXAM: CT ANGIOGRAPHY HEAD AND NECK TECHNIQUE: Multidetector CT imaging of the head and neck was performed using the standard protocol during bolus administration of intravenous contrast. Multiplanar CT image reconstructions and MIPs were obtained to evaluate the vascular anatomy. Carotid stenosis measurements (when applicable) are obtained utilizing NASCET criteria, using the distal internal carotid diameter as the denominator. CONTRAST:  75 cc Isovue 370 intravenous COMPARISON:  Brain MRI and MRA from yesterday FINDINGS: CT HEAD FINDINGS Brain: Known recent infarcts in the left parietal cortex are not visible. No new infarct noted. No hemorrhage,  hydrocephalus, or mass. Vascular: No hyperdense vessel noted. Skull: No acute or aggressive finding. Sinuses: Large mucous retention cyst in the left maxillary antrum. Patchy mucosal thickening in the ethmoid sinuses. Orbits: Negative Review of the MIP images confirms the above findings CTA NECK FINDINGS Aortic arch: Negative.  Three vessel branching Right carotid system: Small calcified plaque at the brachiocephalic origin and bifurcation. Minimal plaque at the common carotid bifurcation. No stenosis, dissection, or beading.  Left carotid system: Moderate calcified and noncalcified plaque at the common carotid bifurcation. No flow limiting stenosis, dissection, or ulceration. Vertebral arteries: Moderate atherosclerotic plaque at the left subclavian origin. No flow limiting stenosis. Right dominant vertebral artery. The vertebral arteries are smooth and patent. Skeleton: C5-6 and C6-7 ACDF. Questionable interbody fusion at C6-7, but no signs of ventral plate failure. Other neck: Paranasal sinus disease including large mucous retention cyst in the left maxillary antrum. Upper chest: Negative Review of the MIP images confirms the above findings CTA HEAD FINDINGS Anterior circulation: Hypoplastic right A1 segment. Bilateral fetal type PCA. No major branch occlusion or flow limiting stenosis. Negative for aneurysm. No beading. M1 irregularity noted on previous MRA is not seen on this study. Posterior circulation: Hypoplastic left and aplastic right P1 segments with fetal type PCAs. No major branch occlusion or flow limiting stenosis. Negative for aneurysm. Venous sinuses: Patent. Prominent emissary veins at the torcula draining into the suboccipital venous plexus. Anatomic variants: Described above Delayed phase: Negative for mass or parenchymal enhancement. Review of the MIP images confirms the above findings IMPRESSION: 1. No major branch occlusion, flow limiting stenosis, or embolic source identified. 2. Cervical  carotid atherosclerosis with up to moderate plaque at the left ICA bulb. Electronically Signed   By: Monte Fantasia M.D.   On: 06/10/2016 15:39   Ct Head Wo Contrast  Result Date: 06/09/2016 CLINICAL DATA:  Dizziness for several days, nausea and weakness EXAM: CT HEAD WITHOUT CONTRAST TECHNIQUE: Contiguous axial images were obtained from the base of the skull through the vertex without intravenous contrast. COMPARISON:  MR brain of 04/11/2016 and CT brain of 04/10/2016 FINDINGS: Brain: The ventricular system is within normal limits in size and the septum is midline in position. The fourth ventricle and basilar cisterns are unremarkable. No hemorrhage, mass lesion, or acute infarction is seen. Vascular: No vascular abnormality is seen on this unenhanced study. Skull: On bone window images, no calvarial abnormality is seen. Sinuses/Orbits: There is a retention cyst within the left maxillary sinus. Opacification of a few ethmoid air cells is noted. Otherwise the paranasal sinuses are well pneumatized. Other: None. IMPRESSION: 1. Negative unenhanced CT of the brain. 2. Probable retention cysts in the left maxillary sinus with some opacification of a few ethmoid air cells. Electronically Signed   By: Ivar Drape M.D.   On: 06/09/2016 13:35   Ct Angio Neck W Or Wo Contrast  Result Date: 06/10/2016 CLINICAL DATA:  Dizziness and vertigo since Sunday. EXAM: CT ANGIOGRAPHY HEAD AND NECK TECHNIQUE: Multidetector CT imaging of the head and neck was performed using the standard protocol during bolus administration of intravenous contrast. Multiplanar CT image reconstructions and MIPs were obtained to evaluate the vascular anatomy. Carotid stenosis measurements (when applicable) are obtained utilizing NASCET criteria, using the distal internal carotid diameter as the denominator. CONTRAST:  75 cc Isovue 370 intravenous COMPARISON:  Brain MRI and MRA from yesterday FINDINGS: CT HEAD FINDINGS Brain: Known recent infarcts  in the left parietal cortex are not visible. No new infarct noted. No hemorrhage, hydrocephalus, or mass. Vascular: No hyperdense vessel noted. Skull: No acute or aggressive finding. Sinuses: Large mucous retention cyst in the left maxillary antrum. Patchy mucosal thickening in the ethmoid sinuses. Orbits: Negative Review of the MIP images confirms the above findings CTA NECK FINDINGS Aortic arch: Negative.  Three vessel branching Right carotid system: Small calcified plaque at the brachiocephalic origin and bifurcation. Minimal plaque at the common carotid bifurcation. No stenosis, dissection, or  beading. Left carotid system: Moderate calcified and noncalcified plaque at the common carotid bifurcation. No flow limiting stenosis, dissection, or ulceration. Vertebral arteries: Moderate atherosclerotic plaque at the left subclavian origin. No flow limiting stenosis. Right dominant vertebral artery. The vertebral arteries are smooth and patent. Skeleton: C5-6 and C6-7 ACDF. Questionable interbody fusion at C6-7, but no signs of ventral plate failure. Other neck: Paranasal sinus disease including large mucous retention cyst in the left maxillary antrum. Upper chest: Negative Review of the MIP images confirms the above findings CTA HEAD FINDINGS Anterior circulation: Hypoplastic right A1 segment. Bilateral fetal type PCA. No major branch occlusion or flow limiting stenosis. Negative for aneurysm. No beading. M1 irregularity noted on previous MRA is not seen on this study. Posterior circulation: Hypoplastic left and aplastic right P1 segments with fetal type PCAs. No major branch occlusion or flow limiting stenosis. Negative for aneurysm. Venous sinuses: Patent. Prominent emissary veins at the torcula draining into the suboccipital venous plexus. Anatomic variants: Described above Delayed phase: Negative for mass or parenchymal enhancement. Review of the MIP images confirms the above findings IMPRESSION: 1. No major  branch occlusion, flow limiting stenosis, or embolic source identified. 2. Cervical carotid atherosclerosis with up to moderate plaque at the left ICA bulb. Electronically Signed   By: Monte Fantasia M.D.   On: 06/10/2016 15:39   Mr Angiogram Head Wo Contrast  Result Date: 06/09/2016 CLINICAL DATA:  71 y/o F; severe vertigo, gait instability, history of TIA. EXAM: MRI HEAD WITHOUT CONTRAST MRA HEAD WITHOUT CONTRAST TECHNIQUE: Multiplanar, multiecho pulse sequences of the brain and surrounding structures were obtained without intravenous contrast. Angiographic images of the head were obtained using MRA technique without contrast. COMPARISON:  06/09/2016 CT head. MRI and MRA of head dated 04/11/2016. FINDINGS: MRI HEAD FINDINGS Brain: Scattered subcentimeter foci of diffusion restriction within the left parietal cortex consistent with acute to early subacute infarction. Several small foci of T2 FLAIR hyperintensity in the left frontal lobe correspond to areas of acute ischemia on prior MRI in are compatible with encephalomalacia sequelae of chronic infarction. No abnormal susceptibility hypointensity to indicate intracranial hemorrhage. No focal mass effect. No extra-axial collection. No hydrocephalus. Vascular: As below. Skull and upper cervical spine: Partially visualize cervical spondylosis in anterior cervical discectomy and fusion probably moderate canal stenosis at C3-4 and C4-5. Calvarium is unremarkable. Sinuses/Orbits: Complete opacification of the left maxillary sinus with large retention cysts and partial opacification of ethmoid air cells. Chronic inflammatory changes of right maxillary sinus with atelectasis. Orbits are unremarkable. Other: None. MRA HEAD FINDINGS Internal carotid arteries: Patent. Mild irregularity of right greater than left paraclinoid internal carotid arteries without significant stenosis probably representing intracranial atherosclerosis. Anterior cerebral arteries:  Patent.  Middle cerebral arteries: Patent. Mild irregularity of right M1 segment without significant stenosis probably representing atherosclerotic disease. Anterior communicating artery: Patent. Posterior communicating arteries: Patent. Large posterior communicating artery is diminutive P1 segments compatible with persistent fetal circulation bilaterally. Posterior cerebral arteries:  Patent. Basilar artery:  Patent. Vertebral arteries:  Patent. No evidence of high-grade stenosis, large vessel occlusion, or aneurysm. IMPRESSION: 1. Several scattered subcentimeter foci of diffusion restriction within left parietal cortex consistent with acute to early subacute infarction, probably representing embolic phenomenon. No hemorrhage identified. 2. Extensive paranasal sinus disease greatest in left maxillary sinus. 3. Patent circle of Willis without evidence for large vessel occlusion, high-grade stenosis, or aneurysm. 4. Intracranial atherosclerosis with non stenotic mild irregularity of right M1 and bilateral paraclinoid ICA. These results were called by  telephone at the time of interpretation on 06/09/2016 at 6:55 pm to Dr. Hinda Kehr , who verbally acknowledged these results. Electronically Signed   By: Kristine Garbe M.D.   On: 06/09/2016 18:58   Mr Brain Wo Contrast  Result Date: 06/09/2016 CLINICAL DATA:  71 y/o F; severe vertigo, gait instability, history of TIA. EXAM: MRI HEAD WITHOUT CONTRAST MRA HEAD WITHOUT CONTRAST TECHNIQUE: Multiplanar, multiecho pulse sequences of the brain and surrounding structures were obtained without intravenous contrast. Angiographic images of the head were obtained using MRA technique without contrast. COMPARISON:  06/09/2016 CT head. MRI and MRA of head dated 04/11/2016. FINDINGS: MRI HEAD FINDINGS Brain: Scattered subcentimeter foci of diffusion restriction within the left parietal cortex consistent with acute to early subacute infarction. Several small foci of T2 FLAIR  hyperintensity in the left frontal lobe correspond to areas of acute ischemia on prior MRI in are compatible with encephalomalacia sequelae of chronic infarction. No abnormal susceptibility hypointensity to indicate intracranial hemorrhage. No focal mass effect. No extra-axial collection. No hydrocephalus. Vascular: As below. Skull and upper cervical spine: Partially visualize cervical spondylosis in anterior cervical discectomy and fusion probably moderate canal stenosis at C3-4 and C4-5. Calvarium is unremarkable. Sinuses/Orbits: Complete opacification of the left maxillary sinus with large retention cysts and partial opacification of ethmoid air cells. Chronic inflammatory changes of right maxillary sinus with atelectasis. Orbits are unremarkable. Other: None. MRA HEAD FINDINGS Internal carotid arteries: Patent. Mild irregularity of right greater than left paraclinoid internal carotid arteries without significant stenosis probably representing intracranial atherosclerosis. Anterior cerebral arteries:  Patent. Middle cerebral arteries: Patent. Mild irregularity of right M1 segment without significant stenosis probably representing atherosclerotic disease. Anterior communicating artery: Patent. Posterior communicating arteries: Patent. Large posterior communicating artery is diminutive P1 segments compatible with persistent fetal circulation bilaterally. Posterior cerebral arteries:  Patent. Basilar artery:  Patent. Vertebral arteries:  Patent. No evidence of high-grade stenosis, large vessel occlusion, or aneurysm. IMPRESSION: 1. Several scattered subcentimeter foci of diffusion restriction within left parietal cortex consistent with acute to early subacute infarction, probably representing embolic phenomenon. No hemorrhage identified. 2. Extensive paranasal sinus disease greatest in left maxillary sinus. 3. Patent circle of Willis without evidence for large vessel occlusion, high-grade stenosis, or aneurysm. 4.  Intracranial atherosclerosis with non stenotic mild irregularity of right M1 and bilateral paraclinoid ICA. These results were called by telephone at the time of interpretation on 06/09/2016 at 6:55 pm to Dr. Hinda Kehr , who verbally acknowledged these results. Electronically Signed   By: Kristine Garbe M.D.   On: 06/09/2016 18:58   US Carotid Bilateral  Result Date: 06/10/2016 CLINICAL DATA:  Stroke.  History of hypertension and hyperlipidemia. EXAM: BILATERAL CAROTID DUPLEX ULTRASOUND TECHNIQUE: Pearline Cables scale imaging, color Doppler and duplex ultrasound were performed of bilateral carotid and vertebral arteries in the neck. COMPARISON:  04/10/2016 FINDINGS: Criteria: Quantification of carotid stenosis is based on velocity parameters that correlate the residual internal carotid diameter with NASCET-based stenosis levels, using the diameter of the distal internal carotid lumen as the denominator for stenosis measurement. The following velocity measurements were obtained: RIGHT ICA:  98/13 cm/sec CCA:  [id number] cm/sec SYSTOLIC ICA/CCA RATIO:  1.0 DIASTOLIC ICA/CCA RATIO:  0.9 ECA:  79 cm/sec LEFT ICA:  102/24 cm/sec CCA:  [id number] cm/sec SYSTOLIC ICA/CCA RATIO:  1.0 DIASTOLIC ICA/CCA RATIO:  1.3 ECA:  150 cm/sec RIGHT CAROTID ARTERY: There is a minimal amount of eccentric mixed echogenic plaque within the right carotid bulb (images 14 and 16), extending to  involve the origin and proximal aspect of the right internal carotid artery (image 24), morphologically similar to the 03/2016 examination and again not resulting in elevated peak systolic velocities within the interrogated course of the right internal carotid artery to suggest a hemodynamically significant stenosis. RIGHT VERTEBRAL ARTERY:  Antegrade Flow LEFT CAROTID ARTERY: There is a moderate amount of eccentric mixed echogenic plaque within the left carotid bulb (image 46), extending to involve the origin and proximal aspects of the left internal  carotid artery (image 54), morphologically similar to the 03/2016 examination and again not resulting in elevated peak systolic velocities within the interrogated course the left internal carotid artery to suggest a hemodynamically significant stenosis. LEFT VERTEBRAL ARTERY:  Antegrade Flow IMPRESSION: Minimal to moderate amount of bilateral atherosclerotic plaque, left greater than right, morphologically similar to the 03/2016 examination and again not resulting in a hemodynamically significant stenosis within either internal carotid artery. Electronically Signed   By: Sandi Mariscal M.D.   On: 06/10/2016 15:02    Assessment: 71 y.o. female presenting with new onset dizziness.  MRI of the brain reviewed and shows acute left parietal small infarcts.  Embolic etiology likely.  Patient with recent work up in November of 2017 was unremarkable.  Patient also had telemetry monitoring on 1/11 that was unremarkable as well.  LDL 52.  A1c pending.   On Plavix at home.  Stroke Risk Factors - hyperlipidemia and hypertension  Plan: 1. HgbA1c pending.  6.0 on 04/11/16 2. Frequent neuro checks 3. PT consult, OT consult, Speech consult 4. Consider TEE 5. Repeat carotid dopplers 6. Prophylactic therapy-Continue Plavix and start ASA 81mg  daily 7. NPO until RN stroke swallow screen 8. Telemetry monitoring .    Alexis Goodell, MD Neurology [phone] 06/10/2016, 9:12 PM

## 2016-06-10 NOTE — Progress Notes (Signed)
OT Cancellation Note  Patient Details Name: Kimberly Trevino MRN: [id number] DOB: 1946-02-07   Cancelled Treatment:    Reason Eval/Treat Not Completed: OT screened, no needs identified, will sign off . Order received and chart reviewed.  Met with patient and she feels she is able to complete ADLs but continues to have bouts of dizziness.  Pt seen for screening only and rec adaptations for dressing to keep dizziness from increasing to prevent falls.  Signing off for OT.  Please re-consult if status changes.  Thank you for the referral.  Chrys Racer, OTR/L ascom [phone] 06/10/16, 12:02 PM

## 2016-06-10 NOTE — Evaluation (Signed)
Physical Therapy Evaluation Patient Details Name: Kimberly Trevino MRN: [id number] DOB: 1945/08/08 Today's Date: 06/10/2016   History of Present Illness  71 y.o. 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    Clinical Impression  Pt did well with PT exam showing good mobility, strength, balance and ambulation.  She was able to negotiate up/down steps and reported feeling like she was essentially at her baseline.  Her only complaint was some mild dizziness on first sitting up which quickly subsided.  Pt does not require any further PT intervention.     Follow Up Recommendations No PT follow up    Equipment Recommendations       Recommendations for Other Services       Precautions / Restrictions Precautions Precautions: Fall Restrictions Weight Bearing Restrictions: No      Mobility  Bed Mobility Overal bed mobility: Independent                Transfers Overall transfer level: Independent Equipment used: None                Ambulation/Gait Ambulation/Gait assistance: Independent Ambulation Distance (Feet): 250 Feet Assistive device: None       General Gait Details: Pt walks with safe cadence and consistent speed.  She has no LOBs, no fatigue and generally did well.  Pt confident and reports she feels essentially at her baseline.   Stairs  Pt was able to negotiate up/down 8 steps with single rail and alternating pattern.  No assist required good confidence and safety.          Wheelchair Mobility    Modified Rankin (Stroke Patients Only)       Balance Overall balance assessment: Independent                                           Pertinent Vitals/Pain      Home Living Family/patient expects to be discharged to:: Private residence Living  Arrangements: Spouse/significant other Available Help at Discharge: Family Type of Home: House Home Access: Stairs to enter Entrance Stairs-Rails: Can reach both Entrance Stairs-Number of Steps: 15   Home Equipment: None      Prior Function Level of Independence: Independent         Comments: Independent with ADLs, IADLs, diving, and working.     Hand Dominance        Extremity/Trunk Assessment   Upper Extremity Assessment Upper Extremity Assessment: Overall WFL for tasks assessed    Lower Extremity Assessment Lower Extremity Assessment: Overall WFL for tasks assessed       Communication   Communication: No difficulties  Cognition Arousal/Alertness: Awake/alert Behavior During Therapy: WFL for tasks assessed/performed Overall Cognitive Status: Within Functional Limits for tasks assessed                      General Comments      Exercises     Assessment/Plan    PT Assessment Patent does not need any further PT services  PT Problem List            PT Treatment Interventions      PT Goals (Current goals can be found in the Care Plan section)  Acute  Rehab PT Goals Patient Stated Goal: go home today PT Goal Formulation: With patient    Frequency     Barriers to discharge        Co-evaluation               End of Session Equipment Utilized During Treatment: Gait belt Activity Tolerance: Patient tolerated treatment well Patient left: with chair alarm set;with call bell/phone within reach           Time: 0822-0846 PT Time Calculation (min) (ACUTE ONLY): 24 min   Charges:   PT Evaluation $PT Eval Low Complexity: 1 Procedure     PT G CodesKreg Shropshire, DPT 06/10/2016, 10:11 AM

## 2016-06-11 DIAGNOSIS — I639 Cerebral infarction, unspecified: Secondary | ICD-10-CM

## 2016-06-11 LAB — CBC
HEMATOCRIT: 37.2 % (ref 35.0–47.0)
HEMOGLOBIN: 13.2 g/dL (ref 12.0–16.0)
MCH: 29.1 pg (ref 26.0–34.0)
MCHC: 35.6 g/dL (ref 32.0–36.0)
MCV: 81.7 fL (ref 80.0–100.0)
Platelets: 256 10*3/uL (ref 150–440)
RBC: 4.55 MIL/uL (ref 3.80–5.20)
RDW: 13.2 % (ref 11.5–14.5)
WBC: 3.5 10*3/uL — ABNORMAL LOW (ref 3.6–11.0)

## 2016-06-11 LAB — BASIC METABOLIC PANEL
ANION GAP: 6 (ref 5–15)
BUN: 13 mg/dL (ref 6–20)
CO2: 25 mmol/L (ref 22–32)
CREATININE: 0.86 mg/dL (ref 0.44–1.00)
Calcium: 9.2 mg/dL (ref 8.9–10.3)
Chloride: 106 mmol/L (ref 101–111)
GFR calc non Af Amer: >60 mL/min (ref >60)
GLUCOSE: 126 mg/dL — AB (ref 65–99)
Potassium: 3.7 mmol/L (ref 3.5–5.1)
Sodium: 137 mmol/L (ref 135–145)

## 2016-06-11 LAB — HEMOGLOBIN A1C
HEMOGLOBIN A1C: 5.9 % — AB (ref 4.8–5.6)
MEAN PLASMA GLUCOSE: 123 mg/dL

## 2016-06-11 MED ORDER — MECLIZINE HCL 25 MG PO TABS
12.5000 mg | ORAL_TABLET | Freq: Two times a day (BID) | ORAL | Status: DC | PRN
  Administered 2016-06-11 (×2): 12.5 mg via ORAL
  Filled 2016-06-11 (×2): qty 1

## 2016-06-11 NOTE — Progress Notes (Signed)
[facility] [place] NAME: Kimberly Trevino    MR#:  [id number]  DATE OF BIRTH:  [birth year]  SUBJECTIVE:  CHIEF COMPLAINT:   Chief Complaint  Patient presents with  . Dizziness  still dizziness, Daughter at bedside. Denies any headache or blurry vision. Tolerating diet.  REVIEW OF SYsTEMS:  Review of Systems  Constitutional: Positive for malaise/fatigue. Negative for chills, fever and weight loss.  HENT: Negative for nosebleeds and sore throat.   Eyes: Negative for blurred vision.  Respiratory: Negative for cough, shortness of breath and wheezing.   Cardiovascular: Negative for chest pain, orthopnea, leg swelling and PND.  Gastrointestinal: Negative for abdominal pain, constipation, diarrhea, heartburn, nausea and vomiting.  Genitourinary: Negative for dysuria and urgency.  Musculoskeletal: Negative for back pain.  Skin: Negative for rash.  Neurological: Positive for dizziness and weakness. Negative for speech change, focal weakness and headaches.  Endo/Heme/Allergies: Does not bruise/bleed easily.  Psychiatric/Behavioral: Negative for depression.   DRUG ALLERGIES:   Allergies  Allergen Reactions  . Sulfa Antibiotics    VITALS:  Blood pressure 132/67, pulse 75, temperature 98.5 F (36.9 C), temperature source Oral, resp. rate 20, height 5\' 2"  (1.575 m), weight 78.5 kg (173 lb), SpO2 100 %. PHYSICAL EXAMINATION:  Physical Exam  Constitutional: She is oriented to person, place, and time and well-developed, well-nourished, and in no distress.  HENT:  Head: Normocephalic and atraumatic.  Eyes: Conjunctivae and EOM are normal. Pupils are equal, round, and reactive to light.  Neck: Normal range of motion. Neck supple. No tracheal deviation present. No thyromegaly present.  Cardiovascular: Normal rate, regular rhythm and normal heart sounds.   Pulmonary/Chest: Effort normal and breath sounds normal. No respiratory distress. She has no  wheezes. She exhibits no tenderness.  Abdominal: Soft. Bowel sounds are normal. She exhibits no distension. There is no tenderness.  Musculoskeletal: Normal range of motion.  Neurological: She is alert and oriented to person, place, and time. No cranial nerve deficit.  Skin: Skin is warm and dry. No rash noted.  Psychiatric: Mood and affect normal.   LABORATORY PANEL:   CBC  Recent Labs Lab 06/11/16 0540  WBC 3.5*  HGB 13.2  HCT 37.2  PLT 256   ------------------------------------------------------------------------------------------------------------------ Chemistries   Recent Labs Lab 06/09/16 1350 06/11/16 0540  NA 139 137  K 3.2* 3.7  CL 107 106  CO2 24 25  GLUCOSE 99 126*  BUN 15 13  CREATININE 0.72 0.86  CALCIUM 9.0 9.2  AST 27  --   ALT 24  --   ALKPHOS 60  --   BILITOT 1.0  --    RADIOLOGY:  No results found. ASSESSMENT AND PLAN:  Kimberly Trevino  is a 71 y.o. 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 She was feeling lack of energy malaise and fatigue. Denies any dysphagia dysarthria visual symptoms or any focal weakness.  1.Acute left parietal stroke appears embolic possible Patient recently had left frontal stroke in November 2017.  - continue asa + Plavix + high intensity statin - Pending Neurology consultation for recurrent stroke questionable embolic, ? Oral anticoagulation will defer to neurology Cardiac consulted scheduled for TEE on Monday . If TEE is normal Dr. Caryl Comes is recommending -LINQ monitor placement on Tuesday or outpatient follow-up  A1c 5.9, LDL 52  PT/Patient therapy has recommended no PT/OT follow-up  -CTA head and neck with moderate plaque at the left  internal carotid artery bulb   2. Hypertension allow permissive hypertension    -Resume home meds   3. Hyperlipidemia on Crestor  4. History of PSVT -Patient recently had a 30 day Holter monitor which did not show any evidence of A. fib according to Dr. Fletcher Anon -EKG shows normal sinus rhythm  5. Hypokalemia: Resolved potasReplete and recheck   All the records are reviewed and case discussed with Care Management/Social Worker. Management plans discussed with the patient,  daughter and they are in agreement.  CODE STATUS: FULL CODE  TOTAL TIME TAKING CARE OF THIS PATIENT: 35 minutes.   More than 50% of the time was spent in counseling/coordination of care: YES  POSSIBLE D/C IN 1-2 DAYS, DEPENDING ON CLINICAL CONDITION. AND NEURO Tonita Cong M.D on 06/11/2016 at 4:03 PM  Between 7am to 6pm - Pager - [phone]  After 6pm go to [email or web address] - Proofreader  [facility]  Office  [phone]  CC: Primary care physician; Mable Paris, FNP  Note: This dictation was prepared with Dragon dictation along with smaller phrase technology. Any transcriptional errors that result from this process are unintentional.

## 2016-06-11 NOTE — Progress Notes (Signed)
ELECTROPHYSIOLOGY CONSULT NOTE  Patient ID: Kimberly Trevino MRN: [id number], DOB/AGE: [birth year]   Admit date: 06/09/2016 Date of Consult: 06/11/2016  Primary Physician: Mable Paris, FNP Primary Cardiologist:none Reason for Consultation: Cryptogenic stroke - left parietal ; recommendations regarding Implantable Loop Recorder  History of Present Illness Kimberly Trevino was admitted on 06/09/2016 with dizzinsess.  T   Imaging demonstrated L parietal infarcts concerning for embolic source.  she has undergone workup for stroke including echocardiogram and carotid dopplers.  The patient has been monitored on telemetry which has demonstrated sinus rhythm with no arrhythmias.  Inpatient stroke work-up is to be completed with a TEE.   Echocardiogram 11/17 n demonstrated  Normal LV function  She was hospitalized at that time for TIA also r.  Lab work is reviewed.  Prior to admission, the patient denies chest pain, shortness of breath, dizziness, palpitations, or syncope.  They are recovering from their stroke with plans to go home at discharge.  EP has been asked to evaluate for placement of an implantable loop recorder to monitor for atrial fibrillation.     Past Medical History:  Diagnosis Date  . Asthma   . Coronary artery disease 2011   Cardiac cath in 2011 at [facility]: 50% mid LAD stenosis without evidence of obstructive disease. Normal ejection fraction. Nuclear stress test done in October of 2013 4 atypical chest pain was normal.  . Hyperlipidemia   . Hypertension   . PSVT (paroxysmal supraventricular tachycardia) ([place])   . Tachycardia 2010  . TIA (transient ischemic attack)      Surgical History:  Past Surgical History:  Procedure Laterality Date  . ABDOMINAL HYSTERECTOMY    . CARDIAC CATHETERIZATION  2011   50% to the mid LAD lesion  . PARTIAL HYSTERECTOMY  1999  . TONSILLECTOMY       Prescriptions Prior to Admission  Medication Sig Dispense Refill Last Dose  .  CARTIA XT 240 MG 24 hr capsule Take 240 mg by mouth daily.   06/08/2016 at 2000  . Cholecalciferol (VITAMIN D PO) Take by mouth daily.   06/09/2016 at 0930  . clopidogrel (PLAVIX) 75 MG tablet Take 1 tablet (75 mg total) by mouth daily. 90 tablet 3 06/09/2016 at 0930  . estradiol (ESTRACE) 0.5 MG tablet Take 0.5 mg by mouth daily.   06/09/2016 at 0930  . fluocinonide cream (LIDEX) [id number] % Apply 1 application topically as needed.    prn at prn  . lisinopril (PRINIVIL,ZESTRIL) 20 MG tablet Take 20 mg by mouth daily.   06/08/2016 at 2000  . loratadine (CLARITIN) 10 MG tablet Take 10 mg by mouth daily.   06/09/2016 at 0930  . metoprolol succinate (TOPROL-XL) 100 MG 24 hr tablet Take 100 mg by mouth daily. Take with or immediately following a meal.   06/08/2016 at 2000  . Misc Natural Products (GINKOGIN PO) Take by mouth daily.   06/09/2016 at 0930  . Multiple Vitamin (MULTIVITAMIN) tablet Take 1 tablet by mouth daily.   06/09/2016 at 0930  . mupirocin ointment (BACTROBAN) 2 % APPLY TOPICALLY THREE TIMES DAILY   prn at prn  . oxybutynin (DITROPAN) 5 MG tablet Take 5 mg by mouth daily.   06/08/2016 at 2000  . rosuvastatin (CRESTOR) 5 MG tablet Take 5 mg by mouth daily.   06/08/2016 at 2000  . Valerian Root 100 MG CAPS Take 100 mg by mouth at bedtime.   prn at prn  . VENTOLIN HFA 108 (90 BASE) MCG/ACT  inhaler Inhale 2 puffs into the lungs every 4 (four) hours as needed.    prn at prn  . vitamin C (ASCORBIC ACID) 500 MG tablet Take 500 mg by mouth daily.   06/09/2016 at 0930  . rosuvastatin (CRESTOR) 10 MG tablet Take 1 tablet (10 mg total) by mouth daily at 6 PM. 90 tablet 2     Inpatient Medications:  . aspirin EC  81 mg Oral Daily  . cholecalciferol  400 Units Oral Daily  . clopidogrel  75 mg Oral Daily  . diltiazem  240 mg Oral Daily  . enoxaparin (LOVENOX) injection  40 mg Subcutaneous Q24H  . lisinopril  20 mg Oral Daily  . loratadine  10 mg Oral Daily  . metoprolol succinate  100 mg Oral Daily  .  multivitamin with minerals  1 tablet Oral Daily  . oxybutynin  5 mg Oral Daily  . rosuvastatin  5 mg Oral Q24H  . senna  1 tablet Oral BID  . vitamin C  500 mg Oral Daily    Allergies:  Allergies  Allergen Reactions  . Sulfa Antibiotics     Social History   Social History  . Marital status: Married    Spouse name: N/A  . Number of children: N/A  . Years of education: N/A   Occupational History  . Not on file.   Social History Main Topics  . Smoking status: Never Smoker  . Smokeless tobacco: Never Used  . Alcohol use No  . Drug use: No  . Sexual activity: Not on file   Other Topics Concern  . Not on file   Social History Narrative  . No narrative on file     Family History  Problem Relation Age of Onset  . Hypertension Mother   . Heart attack Father 60    MI  . Heart disease Father   . Hypertension Father   . Hypertension Sister   . Hyperlipidemia Sister   . Heart attack Brother     cardiac arrest  . Hypertension Sister   . Hyperlipidemia Sister   . Breast cancer Neg Hx       Review of Systems: General: No chills, fever, night sweats or weight changes  Cardiovascular:  No chest pain, dyspnea on exertion, edema, orthopnea, palpitations, paroxysmal nocturnal dyspnea Dermatological: No rash, lesions or masses Respiratory: No cough, dyspnea Urologic: No hematuria, dysuria Abdominal: No nausea, vomiting, diarrhea, bright red blood per rectum, melena, or hematemesis Neurologic: No visual changes, weakness, changes in mental status All other systems reviewed and are otherwise negative except as noted above.  Physical Exam: Vitals:   06/10/16 1815 06/10/16 2132 06/11/16 0054 06/11/16 0459  BP: 131/86 126/72 (!) 128/51 120/63  Pulse: 88 77 72 67  Resp: 18 16 20 20   Temp: 98.8 F (37.1 C) 98.3 F (36.8 C) 97.9 F (36.6 C) 97.9 F (36.6 C)  TempSrc: Oral Oral Oral Oral  SpO2: 95% 95% 100% 100%  Weight:      Height:        GEN- The patient is well  appearing, alert and oriented x 3 today.   Head- normocephalic, atraumatic Eyes-  Sclera clear, conjunctiva pink Ears- hearing intact Oropharynx- clear Neck- supple Lungs- Clear to ausculation bilaterally, normal work of breathing Heart- Regular rate and rhythm, no murmurs, rubs or gallops  GI- soft, NT, ND, + BS Extremities- no clubbing, cyanosis, or edema MS- no significant deformity or atrophy Skin- no rash or lesion Psych- euthymic  mood, full affect   Labs:   Lab Results  Component Value Date   WBC 3.5 (L) 06/11/2016   HGB 13.2 06/11/2016   HCT 37.2 06/11/2016   MCV 81.7 06/11/2016   PLT 256 06/11/2016    Recent Labs Lab 06/09/16 1350 06/11/16 0540  NA 139 137  K 3.2* 3.7  CL 107 106  CO2 24 25  BUN 15 13  CREATININE 0.72 0.86  CALCIUM 9.0 9.2  PROT 7.4  --   BILITOT 1.0  --   ALKPHOS 60  --   ALT 24  --   AST 27  --   GLUCOSE 99 126*   Lab Results  Component Value Date   CKTOTAL 364 (H) 05/02/2012   CKMB 2.6 05/02/2012   TROPONINI <0.03 06/09/2016   Lab Results  Component Value Date   CHOL 134 06/10/2016   CHOL 157 05/25/2016   CHOL 208 (H) 04/11/2016   Lab Results  Component Value Date   HDL 32 (L) 06/10/2016   HDL 40 05/25/2016   HDL 36 (L) 04/11/2016   Lab Results  Component Value Date   LDLCALC 52 06/10/2016   LDLCALC 75 05/25/2016   LDLCALC 113 (H) 04/11/2016   Lab Results  Component Value Date   TRIG 251 (H) 06/10/2016   TRIG 212 (H) 05/25/2016   TRIG 294 (H) 04/11/2016   Lab Results  Component Value Date   CHOLHDL 4.2 06/10/2016   CHOLHDL 3.9 05/25/2016   CHOLHDL 5.8 04/11/2016   No results found for: LDLDIRECT  No results found for: DDIMER   Radiology/Studies: Ct Angio Head W Or Wo Contrast  Result Date: 06/10/2016 CLINICAL DATA:  Dizziness and vertigo since Sunday. EXAM: CT ANGIOGRAPHY HEAD AND NECK TECHNIQUE: Multidetector CT imaging of the head and neck was performed using the standard protocol during bolus  administration of intravenous contrast. Multiplanar CT image reconstructions and MIPs were obtained to evaluate the vascular anatomy. Carotid stenosis measurements (when applicable) are obtained utilizing NASCET criteria, using the distal internal carotid diameter as the denominator. CONTRAST:  75 cc Isovue 370 intravenous COMPARISON:  Brain MRI and MRA from yesterday FINDINGS: CT HEAD FINDINGS Brain: Known recent infarcts in the left parietal cortex are not visible. No new infarct noted. No hemorrhage, hydrocephalus, or mass. Vascular: No hyperdense vessel noted. Skull: No acute or aggressive finding. Sinuses: Large mucous retention cyst in the left maxillary antrum. Patchy mucosal thickening in the ethmoid sinuses. Orbits: Negative Review of the MIP images confirms the above findings CTA NECK FINDINGS Aortic arch: Negative.  Three vessel branching Right carotid system: Small calcified plaque at the brachiocephalic origin and bifurcation. Minimal plaque at the common carotid bifurcation. No stenosis, dissection, or beading. Left carotid system: Moderate calcified and noncalcified plaque at the common carotid bifurcation. No flow limiting stenosis, dissection, or ulceration. Vertebral arteries: Moderate atherosclerotic plaque at the left subclavian origin. No flow limiting stenosis. Right dominant vertebral artery. The vertebral arteries are smooth and patent. Skeleton: C5-6 and C6-7 ACDF. Questionable interbody fusion at C6-7, but no signs of ventral plate failure. Other neck: Paranasal sinus disease including large mucous retention cyst in the left maxillary antrum. Upper chest: Negative Review of the MIP images confirms the above findings CTA HEAD FINDINGS Anterior circulation: Hypoplastic right A1 segment. Bilateral fetal type PCA. No major branch occlusion or flow limiting stenosis. Negative for aneurysm. No beading. M1 irregularity noted on previous MRA is not seen on this study. Posterior circulation:  Hypoplastic left and aplastic  right P1 segments with fetal type PCAs. No major branch occlusion or flow limiting stenosis. Negative for aneurysm. Venous sinuses: Patent. Prominent emissary veins at the torcula draining into the suboccipital venous plexus. Anatomic variants: Described above Delayed phase: Negative for mass or parenchymal enhancement. Review of the MIP images confirms the above findings IMPRESSION: 1. No major branch occlusion, flow limiting stenosis, or embolic source identified. 2. Cervical carotid atherosclerosis with up to moderate plaque at the left ICA bulb. Electronically Signed   By: Monte Fantasia M.D.   On: 06/10/2016 15:39   Ct Head Wo Contrast  Result Date: 06/09/2016 CLINICAL DATA:  Dizziness for several days, nausea and weakness EXAM: CT HEAD WITHOUT CONTRAST TECHNIQUE: Contiguous axial images were obtained from the base of the skull through the vertex without intravenous contrast. COMPARISON:  MR brain of 04/11/2016 and CT brain of 04/10/2016 FINDINGS: Brain: The ventricular system is within normal limits in size and the septum is midline in position. The fourth ventricle and basilar cisterns are unremarkable. No hemorrhage, mass lesion, or acute infarction is seen. Vascular: No vascular abnormality is seen on this unenhanced study. Skull: On bone window images, no calvarial abnormality is seen. Sinuses/Orbits: There is a retention cyst within the left maxillary sinus. Opacification of a few ethmoid air cells is noted. Otherwise the paranasal sinuses are well pneumatized. Other: None. IMPRESSION: 1. Negative unenhanced CT of the brain. 2. Probable retention cysts in the left maxillary sinus with some opacification of a few ethmoid air cells. Electronically Signed   By: Ivar Drape M.D.   On: 06/09/2016 13:35   Ct Angio Neck W Or Wo Contrast  Result Date: 06/10/2016 CLINICAL DATA:  Dizziness and vertigo since Sunday. EXAM: CT ANGIOGRAPHY HEAD AND NECK TECHNIQUE: Multidetector CT  imaging of the head and neck was performed using the standard protocol during bolus administration of intravenous contrast. Multiplanar CT image reconstructions and MIPs were obtained to evaluate the vascular anatomy. Carotid stenosis measurements (when applicable) are obtained utilizing NASCET criteria, using the distal internal carotid diameter as the denominator. CONTRAST:  75 cc Isovue 370 intravenous COMPARISON:  Brain MRI and MRA from yesterday FINDINGS: CT HEAD FINDINGS Brain: Known recent infarcts in the left parietal cortex are not visible. No new infarct noted. No hemorrhage, hydrocephalus, or mass. Vascular: No hyperdense vessel noted. Skull: No acute or aggressive finding. Sinuses: Large mucous retention cyst in the left maxillary antrum. Patchy mucosal thickening in the ethmoid sinuses. Orbits: Negative Review of the MIP images confirms the above findings CTA NECK FINDINGS Aortic arch: Negative.  Three vessel branching Right carotid system: Small calcified plaque at the brachiocephalic origin and bifurcation. Minimal plaque at the common carotid bifurcation. No stenosis, dissection, or beading. Left carotid system: Moderate calcified and noncalcified plaque at the common carotid bifurcation. No flow limiting stenosis, dissection, or ulceration. Vertebral arteries: Moderate atherosclerotic plaque at the left subclavian origin. No flow limiting stenosis. Right dominant vertebral artery. The vertebral arteries are smooth and patent. Skeleton: C5-6 and C6-7 ACDF. Questionable interbody fusion at C6-7, but no signs of ventral plate failure. Other neck: Paranasal sinus disease including large mucous retention cyst in the left maxillary antrum. Upper chest: Negative Review of the MIP images confirms the above findings CTA HEAD FINDINGS Anterior circulation: Hypoplastic right A1 segment. Bilateral fetal type PCA. No major branch occlusion or flow limiting stenosis. Negative for aneurysm. No beading. M1  irregularity noted on previous MRA is not seen on this study. Posterior circulation: Hypoplastic left and  aplastic right P1 segments with fetal type PCAs. No major branch occlusion or flow limiting stenosis. Negative for aneurysm. Venous sinuses: Patent. Prominent emissary veins at the torcula draining into the suboccipital venous plexus. Anatomic variants: Described above Delayed phase: Negative for mass or parenchymal enhancement. Review of the MIP images confirms the above findings IMPRESSION: 1. No major branch occlusion, flow limiting stenosis, or embolic source identified. 2. Cervical carotid atherosclerosis with up to moderate plaque at the left ICA bulb. Electronically Signed   By: Monte Fantasia M.D.   On: 06/10/2016 15:39   Mr Angiogram Head Wo Contrast  Result Date: 06/09/2016 CLINICAL DATA:  71 y/o F; severe vertigo, gait instability, history of TIA. EXAM: MRI HEAD WITHOUT CONTRAST MRA HEAD WITHOUT CONTRAST TECHNIQUE: Multiplanar, multiecho pulse sequences of the brain and surrounding structures were obtained without intravenous contrast. Angiographic images of the head were obtained using MRA technique without contrast. COMPARISON:  06/09/2016 CT head. MRI and MRA of head dated 04/11/2016. FINDINGS: MRI HEAD FINDINGS Brain: Scattered subcentimeter foci of diffusion restriction within the left parietal cortex consistent with acute to early subacute infarction. Several small foci of T2 FLAIR hyperintensity in the left frontal lobe correspond to areas of acute ischemia on prior MRI in are compatible with encephalomalacia sequelae of chronic infarction. No abnormal susceptibility hypointensity to indicate intracranial hemorrhage. No focal mass effect. No extra-axial collection. No hydrocephalus. Vascular: As below. Skull and upper cervical spine: Partially visualize cervical spondylosis in anterior cervical discectomy and fusion probably moderate canal stenosis at C3-4 and C4-5. Calvarium is  unremarkable. Sinuses/Orbits: Complete opacification of the left maxillary sinus with large retention cysts and partial opacification of ethmoid air cells. Chronic inflammatory changes of right maxillary sinus with atelectasis. Orbits are unremarkable. Other: None. MRA HEAD FINDINGS Internal carotid arteries: Patent. Mild irregularity of right greater than left paraclinoid internal carotid arteries without significant stenosis probably representing intracranial atherosclerosis. Anterior cerebral arteries:  Patent. Middle cerebral arteries: Patent. Mild irregularity of right M1 segment without significant stenosis probably representing atherosclerotic disease. Anterior communicating artery: Patent. Posterior communicating arteries: Patent. Large posterior communicating artery is diminutive P1 segments compatible with persistent fetal circulation bilaterally. Posterior cerebral arteries:  Patent. Basilar artery:  Patent. Vertebral arteries:  Patent. No evidence of high-grade stenosis, large vessel occlusion, or aneurysm. IMPRESSION: 1. Several scattered subcentimeter foci of diffusion restriction within left parietal cortex consistent with acute to early subacute infarction, probably representing embolic phenomenon. No hemorrhage identified. 2. Extensive paranasal sinus disease greatest in left maxillary sinus. 3. Patent circle of Willis without evidence for large vessel occlusion, high-grade stenosis, or aneurysm. 4. Intracranial atherosclerosis with non stenotic mild irregularity of right M1 and bilateral paraclinoid ICA. These results were called by telephone at the time of interpretation on 06/09/2016 at 6:55 pm to Dr. Hinda Kehr , who verbally acknowledged these results. Electronically Signed   By: Kristine Garbe M.D.   On: 06/09/2016 18:58   Mr Brain Wo Contrast  Result Date: 06/09/2016 CLINICAL DATA:  71 y/o F; severe vertigo, gait instability, history of TIA. EXAM: MRI HEAD WITHOUT CONTRAST MRA  HEAD WITHOUT CONTRAST TECHNIQUE: Multiplanar, multiecho pulse sequences of the brain and surrounding structures were obtained without intravenous contrast. Angiographic images of the head were obtained using MRA technique without contrast. COMPARISON:  06/09/2016 CT head. MRI and MRA of head dated 04/11/2016. FINDINGS: MRI HEAD FINDINGS Brain: Scattered subcentimeter foci of diffusion restriction within the left parietal cortex consistent with acute to early subacute infarction. Several small foci  of T2 FLAIR hyperintensity in the left frontal lobe correspond to areas of acute ischemia on prior MRI in are compatible with encephalomalacia sequelae of chronic infarction. No abnormal susceptibility hypointensity to indicate intracranial hemorrhage. No focal mass effect. No extra-axial collection. No hydrocephalus. Vascular: As below. Skull and upper cervical spine: Partially visualize cervical spondylosis in anterior cervical discectomy and fusion probably moderate canal stenosis at C3-4 and C4-5. Calvarium is unremarkable. Sinuses/Orbits: Complete opacification of the left maxillary sinus with large retention cysts and partial opacification of ethmoid air cells. Chronic inflammatory changes of right maxillary sinus with atelectasis. Orbits are unremarkable. Other: None. MRA HEAD FINDINGS Internal carotid arteries: Patent. Mild irregularity of right greater than left paraclinoid internal carotid arteries without significant stenosis probably representing intracranial atherosclerosis. Anterior cerebral arteries:  Patent. Middle cerebral arteries: Patent. Mild irregularity of right M1 segment without significant stenosis probably representing atherosclerotic disease. Anterior communicating artery: Patent. Posterior communicating arteries: Patent. Large posterior communicating artery is diminutive P1 segments compatible with persistent fetal circulation bilaterally. Posterior cerebral arteries:  Patent. Basilar artery:   Patent. Vertebral arteries:  Patent. No evidence of high-grade stenosis, large vessel occlusion, or aneurysm. IMPRESSION: 1. Several scattered subcentimeter foci of diffusion restriction within left parietal cortex consistent with acute to early subacute infarction, probably representing embolic phenomenon. No hemorrhage identified. 2. Extensive paranasal sinus disease greatest in left maxillary sinus. 3. Patent circle of Willis without evidence for large vessel occlusion, high-grade stenosis, or aneurysm. 4. Intracranial atherosclerosis with non stenotic mild irregularity of right M1 and bilateral paraclinoid ICA. These results were called by telephone at the time of interpretation on 06/09/2016 at 6:55 pm to Dr. Hinda Kehr , who verbally acknowledged these results. Electronically Signed   By: Kristine Garbe M.D.   On: 06/09/2016 18:58   US Carotid Bilateral  Result Date: 06/10/2016 CLINICAL DATA:  Stroke.  History of hypertension and hyperlipidemia. EXAM: BILATERAL CAROTID DUPLEX ULTRASOUND TECHNIQUE: Pearline Cables scale imaging, color Doppler and duplex ultrasound were performed of bilateral carotid and vertebral arteries in the neck. COMPARISON:  04/10/2016 FINDINGS: Criteria: Quantification of carotid stenosis is based on velocity parameters that correlate the residual internal carotid diameter with NASCET-based stenosis levels, using the diameter of the distal internal carotid lumen as the denominator for stenosis measurement. The following velocity measurements were obtained: RIGHT ICA:  98/13 cm/sec CCA:  [id number] cm/sec SYSTOLIC ICA/CCA RATIO:  1.0 DIASTOLIC ICA/CCA RATIO:  0.9 ECA:  79 cm/sec LEFT ICA:  102/24 cm/sec CCA:  [id number] cm/sec SYSTOLIC ICA/CCA RATIO:  1.0 DIASTOLIC ICA/CCA RATIO:  1.3 ECA:  150 cm/sec RIGHT CAROTID ARTERY: There is a minimal amount of eccentric mixed echogenic plaque within the right carotid bulb (images 14 and 16), extending to involve the origin and proximal aspect of the  right internal carotid artery (image 24), morphologically similar to the 03/2016 examination and again not resulting in elevated peak systolic velocities within the interrogated course of the right internal carotid artery to suggest a hemodynamically significant stenosis. RIGHT VERTEBRAL ARTERY:  Antegrade Flow LEFT CAROTID ARTERY: There is a moderate amount of eccentric mixed echogenic plaque within the left carotid bulb (image 46), extending to involve the origin and proximal aspects of the left internal carotid artery (image 54), morphologically similar to the 03/2016 examination and again not resulting in elevated peak systolic velocities within the interrogated course the left internal carotid artery to suggest a hemodynamically significant stenosis. LEFT VERTEBRAL ARTERY:  Antegrade Flow IMPRESSION: Minimal to moderate amount of bilateral atherosclerotic plaque, left  greater than right, morphologically similar to the 03/2016 examination and again not resulting in a hemodynamically significant stenosis within either internal carotid artery. Electronically Signed   By: Sandi Mariscal M.D.   On: 06/10/2016 15:02    12-lead ECG sinus with normal intervals All prior EKG's in EPIC reviewed with no documented atrial fibrillation  SVT   Telemetry Personally reviewed sinus without any afib  Assessment and Plan:  1. Cryptogenic stroke--recurrent events  The patient presents with cryptogenic stroke.  The patient has a TEE planned for Mon  AM.  I spoke * at length with the patient and family about monitoring for afib with either a 30 day event monitor or an implantable loop recorder.  Risks, benefits, and alteratives to implantable loop recorder were discussed with the patient today.  She has previously used a 30 d monitor without elucidation    2 SVT adenosine responsive    We will plan LINQ insertion on Tuesday if pt still in house or can be done as an outpt.  If discharged just arrange with office a time  for LINQ insertion  Reviewed with family   Virl Axe, MD 06/11/2016 10:07 AM

## 2016-06-12 MED ORDER — SODIUM CHLORIDE 0.9 % IV SOLN
INTRAVENOUS | Status: DC
  Administered 2016-06-12: 22:00:00 via INTRAVENOUS

## 2016-06-12 NOTE — Progress Notes (Signed)
No acute cardiac issues  Call if necessary Will see again Monday

## 2016-06-12 NOTE — Plan of Care (Signed)
Problem: Education: Goal: Knowledge of patient specific risk factors addressed and post discharge goals established will improve Outcome: Progressing Pt scheduled for TEE on 06/13/16 am per [facility] notes by Dr Fletcher Anon

## 2016-06-12 NOTE — Progress Notes (Signed)
Did not give pt's scheduled Aspirin and Plavix medications this am, wanted to verify with Dr Margaretmary Eddy if the medication should be given based upon orders for scheduled TEE in am of 06/13/16; Dr Margaretmary Eddy verbally advised that she spoke with Dr Virl Axe and that it is okay to give this medications as scheduled; daily Plavix and Aspirin given at 1724 with other scheduled medications

## 2016-06-12 NOTE — Progress Notes (Signed)
[facility] [place] NAME: Kimberly Trevino    MR#:  [id number]  DATE OF BIRTH:  06-14-1945  SUBJECTIVE:  CHIEF COMPLAINT:   Chief Complaint  Patient presents with  . Dizziness  Denies any dizziness Denies any headache or blurry vision. Tolerating diet.  REVIEW OF SYsTEMS:  Review of Systems  Constitutional: Positive for malaise/fatigue. Negative for chills, fever and weight loss.  HENT: Negative for nosebleeds and sore throat.   Eyes: Negative for blurred vision.  Respiratory: Negative for cough, shortness of breath and wheezing.   Cardiovascular: Negative for chest pain, orthopnea, leg swelling and PND.  Gastrointestinal: Negative for abdominal pain, constipation, diarrhea, heartburn, nausea and vomiting.  Genitourinary: Negative for dysuria and urgency.  Musculoskeletal: Negative for back pain.  Skin: Negative for rash.  Neurological: Positive for weakness. Negative for dizziness, speech change, focal weakness and headaches.  Endo/Heme/Allergies: Does not bruise/bleed easily.  Psychiatric/Behavioral: Negative for depression.   DRUG ALLERGIES:   Allergies  Allergen Reactions  . Sulfa Antibiotics    VITALS:  Blood pressure (!) 110/53, pulse 65, temperature 98.1 F (36.7 C), temperature source Oral, resp. rate 20, height 5\' 2"  (1.575 m), weight 78.5 kg (173 lb), SpO2 100 %. PHYSICAL EXAMINATION:  Physical Exam  Constitutional: She is oriented to person, place, and time and well-developed, well-nourished, and in no distress.  HENT:  Head: Normocephalic and atraumatic.  Eyes: Conjunctivae and EOM are normal. Pupils are equal, round, and reactive to light.  Neck: Normal range of motion. Neck supple. No tracheal deviation present. No thyromegaly present.  Cardiovascular: Normal rate, regular rhythm and normal heart sounds.   Pulmonary/Chest: Effort normal and breath sounds normal. No respiratory distress. She has no wheezes. She  exhibits no tenderness.  Abdominal: Soft. Bowel sounds are normal. She exhibits no distension. There is no tenderness.  Musculoskeletal: Normal range of motion.  Neurological: She is alert and oriented to person, place, and time. No cranial nerve deficit.  Skin: Skin is warm and dry. No rash noted.  Psychiatric: Mood and affect normal.   LABORATORY PANEL:   CBC  Recent Labs Lab 06/11/16 0540  WBC 3.5*  HGB 13.2  HCT 37.2  PLT 256   ------------------------------------------------------------------------------------------------------------------ Chemistries   Recent Labs Lab 06/09/16 1350 06/11/16 0540  NA 139 137  K 3.2* 3.7  CL 107 106  CO2 24 25  GLUCOSE 99 126*  BUN 15 13  CREATININE 0.72 0.86  CALCIUM 9.0 9.2  AST 27  --   ALT 24  --   ALKPHOS 60  --   BILITOT 1.0  --    RADIOLOGY:  No results found. ASSESSMENT AND PLAN:  Kimberly Trevino  is a 71 y.o. 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 She was feeling lack of energy malaise and fatigue. Denies any dysphagia dysarthria visual symptoms or any focal weakness.  1.Acute left parietal stroke appears embolic possible Patient recently had left frontal stroke in November 2017.  - continue asa + Plavix + high intensity statin - Pending Neurology consultation for recurrent stroke questionable embolic, ? Oral anticoagulation will defer to neurology Cardiac consulted scheduled for TEE on Monday . If TEE is normal Dr. Caryl Comes is recommending -LINQ monitor placement on Tuesday or outpatient follow-up   A1c  5.9, LDL 52  PT/Patient therapy has recommended no PT/OT follow-up  -CTA head and neck with moderate plaque at the left  internal carotid artery bulb   2. Hypertension allow permissive hypertension  -Resume home meds     3. Hyperlipidemia on Crestor  4. History of PSVT -Patient recently had a 30 day Holter monitor which did not show any evidence of A. fib according to Dr. Fletcher Anon -EKG shows normal sinus rhythm  5. Hypokalemia: Resolved potasReplete and recheck potassium at 3.7    All the records are reviewed and case discussed with Care Management/Social Worker. Management plans discussed with the patient,  daughter and they are in agreement.  CODE STATUS: FULL CODE  TOTAL TIME TAKING CARE OF THIS PATIENT: 30 minutes.   More than 50% of the time was spent in counseling/coordination of care: YES  POSSIBLE D/C IN 1-2 DAYS, DEPENDING ON CLINICAL CONDITION. AND NEURO Tonita Cong M.D on 06/12/2016 at 12:22 PM  Between 7am to 6pm - Pager - [phone]  After 6pm go to [email or web address] - Proofreader  [facility]  Office  [phone]  CC: Primary care physician; Mable Paris, FNP  Note: This dictation was prepared with Dragon dictation along with smaller phrase technology. Any transcriptional errors that result from this process are unintentional.

## 2016-06-13 ENCOUNTER — Inpatient Hospital Stay (HOSPITAL_COMMUNITY)
Admission: EM | Admit: 2016-06-13 | Discharge: 2016-06-13 | Disposition: A | Discharge status: Home / Self Care | Payer: Medicare Other | Source: Home / Self Care | Attending: Nurse Practitioner | Admitting: Nurse Practitioner

## 2016-06-13 ENCOUNTER — Encounter
Admission: EM | Disposition: A | Discharge status: Home / Self Care | Payer: Self-pay | Source: Home / Self Care | Attending: Internal Medicine

## 2016-06-13 DIAGNOSIS — I639 Cerebral infarction, unspecified: Secondary | ICD-10-CM

## 2016-06-13 HISTORY — PX: TEE WITHOUT CARDIOVERSION: SHX5443

## 2016-06-13 SURGERY — ECHOCARDIOGRAM, TRANSESOPHAGEAL
Anesthesia: General

## 2016-06-13 MED ORDER — MIDAZOLAM HCL 2 MG/2ML IJ SOLN
INTRAMUSCULAR | Status: DC | PRN
  Administered 2016-06-13: 2 mg via INTRAVENOUS

## 2016-06-13 MED ORDER — MECLIZINE HCL 25 MG PO TABS: 25.0000 mg | ORAL_TABLET | Freq: Three times a day (TID) | ORAL | Status: DC | PRN

## 2016-06-13 MED ORDER — ROSUVASTATIN CALCIUM 20 MG PO TABS: 20.0000 mg | ORAL_TABLET | Freq: Every day | ORAL | 0 refills | Status: DC

## 2016-06-13 MED ORDER — LIDOCAINE VISCOUS 2 % MT SOLN
OROMUCOSAL | Status: AC
  Filled 2016-06-13: qty 15

## 2016-06-13 MED ORDER — MIDAZOLAM HCL 5 MG/5ML IJ SOLN
INTRAMUSCULAR | Status: AC
  Filled 2016-06-13: qty 5

## 2016-06-13 MED ORDER — FENTANYL CITRATE (PF) 100 MCG/2ML IJ SOLN
INTRAMUSCULAR | Status: AC
  Filled 2016-06-13: qty 4

## 2016-06-13 MED ORDER — BUTAMBEN-TETRACAINE-BENZOCAINE 2-2-14 % EX AERO
INHALATION_SPRAY | CUTANEOUS | Status: AC
  Filled 2016-06-13: qty 20

## 2016-06-13 MED ORDER — CLOPIDOGREL BISULFATE 75 MG PO TABS: 75.0000 mg | ORAL_TABLET | Freq: Every day | ORAL | 0 refills | Status: DC

## 2016-06-13 MED ORDER — MECLIZINE HCL 25 MG PO TABS
25.0000 mg | ORAL_TABLET | Freq: Three times a day (TID) | ORAL | 0 refills | Status: DC | PRN
Start: 2016-06-13 — End: 2019-11-18

## 2016-06-13 MED ORDER — ASPIRIN 81 MG PO TBEC: 81.0000 mg | DELAYED_RELEASE_TABLET | Freq: Every day | ORAL | Status: DC

## 2016-06-13 MED ORDER — FENTANYL CITRATE (PF) 100 MCG/2ML IJ SOLN
INTRAMUSCULAR | Status: DC | PRN
  Administered 2016-06-13: 50 ug via INTRAVENOUS

## 2016-06-13 MED ORDER — ACETAMINOPHEN 325 MG PO TABS: 650.0000 mg | ORAL_TABLET | Freq: Four times a day (QID) | ORAL | Status: AC | PRN

## 2016-06-13 NOTE — Interval H&P Note (Signed)
History and Physical Interval Note:  06/13/2016 9:51 AM  Kimberly Trevino  has presented today for surgery, with the diagnosis of TEE patient had a stroke IN PT Room 112 ok per Sonia Side   The various methods of treatment have been discussed with the patient and family. After consideration of risks, benefits and other options for treatment, the patient has consented to  Procedure(s): TRANSESOPHAGEAL ECHOCARDIOGRAM (TEE) (N/A) as a surgical intervention .  The patient's history has been reviewed, patient examined, no change in status, stable for surgery.  I have reviewed the patient's chart and labs.  Questions were answered to the patient's satisfaction.     [name]

## 2016-06-13 NOTE — Discharge Summary (Signed)
[place] [facility] [place] NAME: Kimberly Trevino    MR#:  [id number]  DATE OF BIRTH:  1945/07/13  DATE OF ADMISSION:  06/09/2016 ADMITTING PHYSICIAN: Fritzi Mandes, MD  DATE OF DISCHARGE: 06/13/16  PRIMARY CARE PHYSICIAN: Mable Paris, FNP    ADMISSION DIAGNOSIS:  Cerebrovascular accident (CVA) due to embolism of cerebral artery ([place]) [I63.40]  DISCHARGE DIAGNOSIS:  Active Problems:   Acute CVA (cerebrovascular accident) ([place])   SECONDARY DIAGNOSIS:   Past Medical History:  Diagnosis Date  . Asthma   . Coronary artery disease 2011   Cardiac cath in 2011 at [facility]: 50% mid LAD stenosis without evidence of obstructive disease. Normal ejection fraction. Nuclear stress test done in October of 2013 4 atypical chest pain was normal.  . Hyperlipidemia   . Hypertension   . PSVT (paroxysmal supraventricular tachycardia) ([place])   . Tachycardia 2010  . TIA (transient ischemic attack)     HOSPITAL COURSE:  Kimberly Trevino  is a 71 y.o. 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 She was feeling lack of energy malaise and fatigue. Denies any dysphagia dysarthria visual symptoms or any focal weakness. In the emergency room workup included CT of the head that was negative given her symptoms of recent stroke and MRI was done which showed possible embolic click stroke in the in the left parietal cortex. No hemorrhage noted. Patient will be patient will be admitted for further discussion management. I will start her on aspirin. Continue Plavix. Please review history and physical for complete details  Hospital course  1.Acute left parietal stroke  Patient is clinically doing fine TEE performed today is negative Patient recently had left frontal stroke in November  2017.  - continue asa 81 mg enteric-coated + Plavix 75 mg once daily + high intensity statin - Appreciate neurology recommendations  -Patient is disappointed as no clear etiology identified for her stroke .wants second opinion and would prefer following up with [facility] neurology and [facility]  [facility] Caryl Comes is recommending -LINQ monitor placement on Tuesday or outpatient follow-up. Patient refused to follow up A1c 5.9, LDL 52  PT/Patient therapy has recommended no PT/OT follow-up  -CTA head and neck with moderate plaque at the left  internal carotid artery bulb   2. Hypertension allow permissive hypertension  -Resume home meds   3. Hyperlipidemia on high intensity statin  4. History of PSVT -Patient recently had a 30 day Holter monitor which did not show any evidence of A. fib according to Dr. Fletcher Anon -EKG shows normal sinus rhythm  5. Hypokalemia: Resolved potasReplete and recheck potassium at 3.7     DISCHARGE CONDITIONS:   FAIR  CONSULTS OBTAINED:  Treatment Team:  Alexis Goodell, MD Wellington Hampshire, MD   PROCEDURES TEE 06/13/16 - WNL  DRUG ALLERGIES:   Allergies  Allergen Reactions  . Sulfa Antibiotics     DISCHARGE MEDICATIONS:   Current Discharge Medication List    START taking these medications   Details  acetaminophen (TYLENOL) 325 MG tablet Take 2 tablets (650 mg total) by mouth every 6 (six) hours as needed for mild pain (or Fever >/= 101).    aspirin EC 81 MG EC tablet Take 1 tablet (81 mg total) by mouth  daily.    meclizine (ANTIVERT) 25 MG tablet Take 1 tablet (25 mg total) by mouth 3 (three) times daily as needed for dizziness. Qty: 30 tablet, Refills: 0      CONTINUE these medications which have CHANGED   Details  clopidogrel (PLAVIX) 75 MG tablet Take 1 tablet (75 mg total) by mouth daily. Qty: 30 tablet, Refills: 0    rosuvastatin (CRESTOR) 20 MG tablet Take 1 tablet (20 mg  total) by mouth daily at 6 PM. Qty: 30 tablet, Refills: 0      CONTINUE these medications which have NOT CHANGED   Details  CARTIA XT 240 MG 24 hr capsule Take 240 mg by mouth daily.    Cholecalciferol (VITAMIN D PO) Take by mouth daily.    estradiol (ESTRACE) 0.5 MG tablet Take 0.5 mg by mouth daily.    fluocinonide cream (LIDEX) [id number] % Apply 1 application topically as needed.     lisinopril (PRINIVIL,ZESTRIL) 20 MG tablet Take 20 mg by mouth daily.    loratadine (CLARITIN) 10 MG tablet Take 10 mg by mouth daily.    metoprolol succinate (TOPROL-XL) 100 MG 24 hr tablet Take 100 mg by mouth daily. Take with or immediately following a meal.    Misc Natural Products (GINKOGIN PO) Take by mouth daily.    Multiple Vitamin (MULTIVITAMIN) tablet Take 1 tablet by mouth daily.    mupirocin ointment (BACTROBAN) 2 % APPLY TOPICALLY THREE TIMES DAILY    oxybutynin (DITROPAN) 5 MG tablet Take 5 mg by mouth daily.    Valerian Root 100 MG CAPS Take 100 mg by mouth at bedtime.    VENTOLIN HFA 108 (90 BASE) MCG/ACT inhaler Inhale 2 puffs into the lungs every 4 (four) hours as needed.     vitamin C (ASCORBIC ACID) 500 MG tablet Take 500 mg by mouth daily.         DISCHARGE INSTRUCTIONS:  Follow-up with [facility] neurology and [facility] cardiology in a week or as soon as possible    DIET:  Cardiac diet  DISCHARGE CONDITION:  Fair  ACTIVITY:  Activity as tolerated  OXYGEN:  Home Oxygen: No.   Oxygen Delivery: room air  DISCHARGE LOCATION:  home   If you experience worsening of your admission symptoms, develop shortness of breath, life threatening emergency, suicidal or homicidal thoughts you must seek medical attention immediately by calling 911 or calling your MD immediately  if symptoms less severe.  You Must read complete instructions/literature along with all the possible adverse reactions/side effects for all the Medicines you take and that have been prescribed to you. Take any new  Medicines after you have completely understood and accpet all the possible adverse reactions/side effects.   Please note  You were cared for by a hospitalist during your hospital stay. If you have any questions about your discharge medications or the care you received while you were in the hospital after you are discharged, you can call the unit and asked to speak with the hospitalist on call if the hospitalist that took care of you is not available. Once you are discharged, your primary care physician will handle any further medical issues. Please note that NO REFILLS for any discharge medications will be authorized once you are discharged, as it is imperative that you return to your primary care physician (or establish a relationship with a primary care physician if you do not have one) for your aftercare needs so that they can reassess your need for medications and monitor  your lab values.     Today  Chief Complaint  Patient presents with  . Dizziness   Patient is doing fine with intermittent episodes of dizziness. Neurology has commended meclizine. Patient is requesting second opinion, prefers [facility], salicylate patient to follow-up with [facility] neurology and [facility] cardiology as soon as possible/in a week  ROS:  CONSTITUTIONAL: Denies fevers, chills. Denies any fatigue, weakness.  EYES: Denies blurry vision, double vision, eye pain. EARS, NOSE, THROAT: Denies tinnitus, ear pain, hearing loss. RESPIRATORY: Denies cough, wheeze, shortness of breath.  CARDIOVASCULAR: Denies chest pain, palpitations, edema.  GASTROINTESTINAL: Denies nausea, vomiting, diarrhea, abdominal pain. Denies bright red blood per rectum. GENITOURINARY: Denies dysuria, hematuria. ENDOCRINE: Denies nocturia or thyroid problems. HEMATOLOGIC AND LYMPHATIC: Denies easy bruising or bleeding. SKIN: Denies rash or lesion. MUSCULOSKELETAL: Denies pain in neck, back, shoulder, knees, hips or arthritic symptoms.  NEUROLOGIC: Denies  paralysis, paresthesias.  PSYCHIATRIC: Denies anxiety or depressive symptoms.   VITAL SIGNS:  Blood pressure 133/64, pulse 88, temperature 98.5 F (36.9 C), temperature source Oral, resp. rate 18, height 5\' 2"  (1.575 m), weight 78.5 kg (173 lb), SpO2 97 %.  I/O:    Intake/Output Summary (Last 24 hours) at 06/13/16 1545 Last data filed at 06/13/16 0338  Gross per 24 hour  Intake              354 ml  Output                0 ml  Net              354 ml    PHYSICAL EXAMINATION:  GENERAL:  71 y.o.-year-old patient lying in the bed with no acute distress.  EYES: Pupils equal, round, reactive to light and accommodation. No scleral icterus. Extraocular muscles intact.  HEENT: Head atraumatic, normocephalic. Oropharynx and nasopharynx clear.  NECK:  Supple, no jugular venous distention. No thyroid enlargement, no tenderness.  LUNGS: Normal breath sounds bilaterally, no wheezing, rales,rhonchi or crepitation. No use of accessory muscles of respiration.  CARDIOVASCULAR: S1, S2 normal. No murmurs, rubs, or gallops.  ABDOMEN: Soft, non-tender, non-distended. Bowel sounds present. No organomegaly or mass.  EXTREMITIES: No pedal edema, cyanosis, or clubbing.  NEUROLOGIC: Cranial nerves II through XII are intact. Muscle strength 5/5 in all extremities. Sensation intact. Gait not checked.  PSYCHIATRIC: The patient is alert and oriented x 3.  SKIN: No obvious rash, lesion, or ulcer.   DATA REVIEW:   CBC  Recent Labs Lab 06/11/16 0540  WBC 3.5*  HGB 13.2  HCT 37.2  PLT 256    Chemistries   Recent Labs Lab 06/09/16 1350 06/11/16 0540  NA 139 137  K 3.2* 3.7  CL 107 106  CO2 24 25  GLUCOSE 99 126*  BUN 15 13  CREATININE 0.72 0.86  CALCIUM 9.0 9.2  AST 27  --   ALT 24  --   ALKPHOS 60  --   BILITOT 1.0  --     Cardiac Enzymes  Recent Labs Lab 06/09/16 1350  TROPONINI <0.03    Microbiology Results  No results found for this or any previous visit.  RADIOLOGY:  Ct  Angio Head W Or Wo Contrast  Result Date: 06/10/2016 CLINICAL DATA:  Dizziness and vertigo since Sunday. EXAM: CT ANGIOGRAPHY HEAD AND NECK TECHNIQUE: Multidetector CT imaging of the head and neck was performed using the standard protocol during bolus administration of intravenous contrast. Multiplanar CT image reconstructions and MIPs were obtained to evaluate the vascular  anatomy. Carotid stenosis measurements (when applicable) are obtained utilizing NASCET criteria, using the distal internal carotid diameter as the denominator. CONTRAST:  75 cc Isovue 370 intravenous COMPARISON:  Brain MRI and MRA from yesterday FINDINGS: CT HEAD FINDINGS Brain: Known recent infarcts in the left parietal cortex are not visible. No new infarct noted. No hemorrhage, hydrocephalus, or mass. Vascular: No hyperdense vessel noted. Skull: No acute or aggressive finding. Sinuses: Large mucous retention cyst in the left maxillary antrum. Patchy mucosal thickening in the ethmoid sinuses. Orbits: Negative Review of the MIP images confirms the above findings CTA NECK FINDINGS Aortic arch: Negative.  Three vessel branching Right carotid system: Small calcified plaque at the brachiocephalic origin and bifurcation. Minimal plaque at the common carotid bifurcation. No stenosis, dissection, or beading. Left carotid system: Moderate calcified and noncalcified plaque at the common carotid bifurcation. No flow limiting stenosis, dissection, or ulceration. Vertebral arteries: Moderate atherosclerotic plaque at the left subclavian origin. No flow limiting stenosis. Right dominant vertebral artery. The vertebral arteries are smooth and patent. Skeleton: C5-6 and C6-7 ACDF. Questionable interbody fusion at C6-7, but no signs of ventral plate failure. Other neck: Paranasal sinus disease including large mucous retention cyst in the left maxillary antrum. Upper chest: Negative Review of the MIP images confirms the above findings CTA HEAD FINDINGS  Anterior circulation: Hypoplastic right A1 segment. Bilateral fetal type PCA. No major branch occlusion or flow limiting stenosis. Negative for aneurysm. No beading. M1 irregularity noted on previous MRA is not seen on this study. Posterior circulation: Hypoplastic left and aplastic right P1 segments with fetal type PCAs. No major branch occlusion or flow limiting stenosis. Negative for aneurysm. Venous sinuses: Patent. Prominent emissary veins at the torcula draining into the suboccipital venous plexus. Anatomic variants: Described above Delayed phase: Negative for mass or parenchymal enhancement. Review of the MIP images confirms the above findings IMPRESSION: 1. No major branch occlusion, flow limiting stenosis, or embolic source identified. 2. Cervical carotid atherosclerosis with up to moderate plaque at the left ICA bulb. Electronically Signed   By: Monte Fantasia M.D.   On: 06/10/2016 15:39   Ct Angio Neck W Or Wo Contrast  Result Date: 06/10/2016 CLINICAL DATA:  Dizziness and vertigo since Sunday. EXAM: CT ANGIOGRAPHY HEAD AND NECK TECHNIQUE: Multidetector CT imaging of the head and neck was performed using the standard protocol during bolus administration of intravenous contrast. Multiplanar CT image reconstructions and MIPs were obtained to evaluate the vascular anatomy. Carotid stenosis measurements (when applicable) are obtained utilizing NASCET criteria, using the distal internal carotid diameter as the denominator. CONTRAST:  75 cc Isovue 370 intravenous COMPARISON:  Brain MRI and MRA from yesterday FINDINGS: CT HEAD FINDINGS Brain: Known recent infarcts in the left parietal cortex are not visible. No new infarct noted. No hemorrhage, hydrocephalus, or mass. Vascular: No hyperdense vessel noted. Skull: No acute or aggressive finding. Sinuses: Large mucous retention cyst in the left maxillary antrum. Patchy mucosal thickening in the ethmoid sinuses. Orbits: Negative Review of the MIP images  confirms the above findings CTA NECK FINDINGS Aortic arch: Negative.  Three vessel branching Right carotid system: Small calcified plaque at the brachiocephalic origin and bifurcation. Minimal plaque at the common carotid bifurcation. No stenosis, dissection, or beading. Left carotid system: Moderate calcified and noncalcified plaque at the common carotid bifurcation. No flow limiting stenosis, dissection, or ulceration. Vertebral arteries: Moderate atherosclerotic plaque at the left subclavian origin. No flow limiting stenosis. Right dominant vertebral artery. The vertebral arteries are smooth and  patent. Skeleton: C5-6 and C6-7 ACDF. Questionable interbody fusion at C6-7, but no signs of ventral plate failure. Other neck: Paranasal sinus disease including large mucous retention cyst in the left maxillary antrum. Upper chest: Negative Review of the MIP images confirms the above findings CTA HEAD FINDINGS Anterior circulation: Hypoplastic right A1 segment. Bilateral fetal type PCA. No major branch occlusion or flow limiting stenosis. Negative for aneurysm. No beading. M1 irregularity noted on previous MRA is not seen on this study. Posterior circulation: Hypoplastic left and aplastic right P1 segments with fetal type PCAs. No major branch occlusion or flow limiting stenosis. Negative for aneurysm. Venous sinuses: Patent. Prominent emissary veins at the torcula draining into the suboccipital venous plexus. Anatomic variants: Described above Delayed phase: Negative for mass or parenchymal enhancement. Review of the MIP images confirms the above findings IMPRESSION: 1. No major branch occlusion, flow limiting stenosis, or embolic source identified. 2. Cervical carotid atherosclerosis with up to moderate plaque at the left ICA bulb. Electronically Signed   By: Monte Fantasia M.D.   On: 06/10/2016 15:39   Mr Angiogram Head Wo Contrast  Result Date: 06/09/2016 CLINICAL DATA:  71 y/o F; severe vertigo, gait  instability, history of TIA. EXAM: MRI HEAD WITHOUT CONTRAST MRA HEAD WITHOUT CONTRAST TECHNIQUE: Multiplanar, multiecho pulse sequences of the brain and surrounding structures were obtained without intravenous contrast. Angiographic images of the head were obtained using MRA technique without contrast. COMPARISON:  06/09/2016 CT head. MRI and MRA of head dated 04/11/2016. FINDINGS: MRI HEAD FINDINGS Brain: Scattered subcentimeter foci of diffusion restriction within the left parietal cortex consistent with acute to early subacute infarction. Several small foci of T2 FLAIR hyperintensity in the left frontal lobe correspond to areas of acute ischemia on prior MRI in are compatible with encephalomalacia sequelae of chronic infarction. No abnormal susceptibility hypointensity to indicate intracranial hemorrhage. No focal mass effect. No extra-axial collection. No hydrocephalus. Vascular: As below. Skull and upper cervical spine: Partially visualize cervical spondylosis in anterior cervical discectomy and fusion probably moderate canal stenosis at C3-4 and C4-5. Calvarium is unremarkable. Sinuses/Orbits: Complete opacification of the left maxillary sinus with large retention cysts and partial opacification of ethmoid air cells. Chronic inflammatory changes of right maxillary sinus with atelectasis. Orbits are unremarkable. Other: None. MRA HEAD FINDINGS Internal carotid arteries: Patent. Mild irregularity of right greater than left paraclinoid internal carotid arteries without significant stenosis probably representing intracranial atherosclerosis. Anterior cerebral arteries:  Patent. Middle cerebral arteries: Patent. Mild irregularity of right M1 segment without significant stenosis probably representing atherosclerotic disease. Anterior communicating artery: Patent. Posterior communicating arteries: Patent. Large posterior communicating artery is diminutive P1 segments compatible with persistent fetal circulation  bilaterally. Posterior cerebral arteries:  Patent. Basilar artery:  Patent. Vertebral arteries:  Patent. No evidence of high-grade stenosis, large vessel occlusion, or aneurysm. IMPRESSION: 1. Several scattered subcentimeter foci of diffusion restriction within left parietal cortex consistent with acute to early subacute infarction, probably representing embolic phenomenon. No hemorrhage identified. 2. Extensive paranasal sinus disease greatest in left maxillary sinus. 3. Patent circle of Willis without evidence for large vessel occlusion, high-grade stenosis, or aneurysm. 4. Intracranial atherosclerosis with non stenotic mild irregularity of right M1 and bilateral paraclinoid ICA. These results were called by telephone at the time of interpretation on 06/09/2016 at 6:55 pm to Dr. Hinda Kehr , who verbally acknowledged these results. Electronically Signed   By: Kristine Garbe M.D.   On: 06/09/2016 18:58   Mr Brain Wo Contrast  Result Date: 06/09/2016  CLINICAL DATA:  71 y/o F; severe vertigo, gait instability, history of TIA. EXAM: MRI HEAD WITHOUT CONTRAST MRA HEAD WITHOUT CONTRAST TECHNIQUE: Multiplanar, multiecho pulse sequences of the brain and surrounding structures were obtained without intravenous contrast. Angiographic images of the head were obtained using MRA technique without contrast. COMPARISON:  06/09/2016 CT head. MRI and MRA of head dated 04/11/2016. FINDINGS: MRI HEAD FINDINGS Brain: Scattered subcentimeter foci of diffusion restriction within the left parietal cortex consistent with acute to early subacute infarction. Several small foci of T2 FLAIR hyperintensity in the left frontal lobe correspond to areas of acute ischemia on prior MRI in are compatible with encephalomalacia sequelae of chronic infarction. No abnormal susceptibility hypointensity to indicate intracranial hemorrhage. No focal mass effect. No extra-axial collection. No hydrocephalus. Vascular: As below. Skull and  upper cervical spine: Partially visualize cervical spondylosis in anterior cervical discectomy and fusion probably moderate canal stenosis at C3-4 and C4-5. Calvarium is unremarkable. Sinuses/Orbits: Complete opacification of the left maxillary sinus with large retention cysts and partial opacification of ethmoid air cells. Chronic inflammatory changes of right maxillary sinus with atelectasis. Orbits are unremarkable. Other: None. MRA HEAD FINDINGS Internal carotid arteries: Patent. Mild irregularity of right greater than left paraclinoid internal carotid arteries without significant stenosis probably representing intracranial atherosclerosis. Anterior cerebral arteries:  Patent. Middle cerebral arteries: Patent. Mild irregularity of right M1 segment without significant stenosis probably representing atherosclerotic disease. Anterior communicating artery: Patent. Posterior communicating arteries: Patent. Large posterior communicating artery is diminutive P1 segments compatible with persistent fetal circulation bilaterally. Posterior cerebral arteries:  Patent. Basilar artery:  Patent. Vertebral arteries:  Patent. No evidence of high-grade stenosis, large vessel occlusion, or aneurysm. IMPRESSION: 1. Several scattered subcentimeter foci of diffusion restriction within left parietal cortex consistent with acute to early subacute infarction, probably representing embolic phenomenon. No hemorrhage identified. 2. Extensive paranasal sinus disease greatest in left maxillary sinus. 3. Patent circle of Willis without evidence for large vessel occlusion, high-grade stenosis, or aneurysm. 4. Intracranial atherosclerosis with non stenotic mild irregularity of right M1 and bilateral paraclinoid ICA. These results were called by telephone at the time of interpretation on 06/09/2016 at 6:55 pm to Dr. Hinda Kehr , who verbally acknowledged these results. Electronically Signed   By: Kristine Garbe M.D.   On: 06/09/2016  18:58   US Carotid Bilateral  Result Date: 06/10/2016 CLINICAL DATA:  Stroke.  History of hypertension and hyperlipidemia. EXAM: BILATERAL CAROTID DUPLEX ULTRASOUND TECHNIQUE: Pearline Cables scale imaging, color Doppler and duplex ultrasound were performed of bilateral carotid and vertebral arteries in the neck. COMPARISON:  04/10/2016 FINDINGS: Criteria: Quantification of carotid stenosis is based on velocity parameters that correlate the residual internal carotid diameter with NASCET-based stenosis levels, using the diameter of the distal internal carotid lumen as the denominator for stenosis measurement. The following velocity measurements were obtained: RIGHT ICA:  98/13 cm/sec CCA:  [id number] cm/sec SYSTOLIC ICA/CCA RATIO:  1.0 DIASTOLIC ICA/CCA RATIO:  0.9 ECA:  79 cm/sec LEFT ICA:  102/24 cm/sec CCA:  [id number] cm/sec SYSTOLIC ICA/CCA RATIO:  1.0 DIASTOLIC ICA/CCA RATIO:  1.3 ECA:  150 cm/sec RIGHT CAROTID ARTERY: There is a minimal amount of eccentric mixed echogenic plaque within the right carotid bulb (images 14 and 16), extending to involve the origin and proximal aspect of the right internal carotid artery (image 24), morphologically similar to the 03/2016 examination and again not resulting in elevated peak systolic velocities within the interrogated course of the right internal carotid artery to suggest a hemodynamically significant stenosis.  RIGHT VERTEBRAL ARTERY:  Antegrade Flow LEFT CAROTID ARTERY: There is a moderate amount of eccentric mixed echogenic plaque within the left carotid bulb (image 46), extending to involve the origin and proximal aspects of the left internal carotid artery (image 54), morphologically similar to the 03/2016 examination and again not resulting in elevated peak systolic velocities within the interrogated course the left internal carotid artery to suggest a hemodynamically significant stenosis. LEFT VERTEBRAL ARTERY:  Antegrade Flow IMPRESSION: Minimal to moderate amount of bilateral  atherosclerotic plaque, left greater than right, morphologically similar to the 03/2016 examination and again not resulting in a hemodynamically significant stenosis within either internal carotid artery. Electronically Signed   By: Sandi Mariscal M.D.   On: 06/10/2016 15:02    EKG:   Orders placed or performed during the hospital encounter of 06/09/16  . ED EKG  . ED EKG      Management plans discussed with the patient, family and they are in agreement.  CODE STATUS:     Code Status Orders        Start     Ordered   06/09/16 2038  Full code  Continuous     06/09/16 2038    Code Status History    Date Active Date Inactive Code Status Order ID Comments User Context   04/10/2016 10:18 AM 04/12/2016  1:43 PM Full Code [id number]  Dustin Flock, MD Inpatient      TOTAL TIME TAKING CARE OF THIS PATIENT: 45  minutes.   Note: This dictation was prepared with Dragon dictation along with smaller phrase technology. Any transcriptional errors that result from this process are unintentional.   @[facility] @  on 06/13/2016 at 3:45 PM  Between 7am to 6pm - Pager - [phone]  After 6pm go to [email or web address] - password [email or web address] [facility]  Office  [phone]  CC: Primary care physician; Mable Paris, FNP

## 2016-06-13 NOTE — Progress Notes (Signed)
*  PRELIMINARY RESULTS* Echocardiogram Echocardiogram Transesophageal has been performed.  Kimberly Trevino 06/13/2016, 9:57 AM

## 2016-06-13 NOTE — Discharge Instructions (Signed)
Follow-up with [facility] neurology and [facility] cardiology in a week or as soon as possible

## 2016-06-13 NOTE — Care Management Important Message (Signed)
Important Message  Patient Details  Name: Kimberly Trevino MRN: [id number] Date of Birth: [birth year]   Medicare Important Message Given:  Yes    Shelbie Ammons, RN 06/13/2016, 1:18 PM

## 2016-06-13 NOTE — Progress Notes (Signed)
Took out both of patient's IVs. Pt tolerated well. Wheeled pt out for discharge

## 2016-06-13 NOTE — CV Procedure (Signed)
TEE was done without complications. It showed normal LV systolic function with no evidence of intracardiac thrombus. No evidence of PFO or ASD. Negative bubble study.  No cardiac source of emboli is identified on this study. Full report to follow.

## 2016-06-13 NOTE — H&P (View-Only) (Signed)
ELECTROPHYSIOLOGY CONSULT NOTE  Patient ID: Kimberly Trevino MRN: [id number], DOB/AGE: 01-15-1946   Admit date: 06/09/2016 Date of Consult: 06/11/2016  Primary Physician: Mable Paris, FNP Primary Cardiologist:none Reason for Consultation: Cryptogenic stroke - left parietal ; recommendations regarding Implantable Loop Recorder  History of Present Illness Kimberly Trevino was admitted on 06/09/2016 with dizzinsess.  T   Imaging demonstrated L parietal infarcts concerning for embolic source.  she has undergone workup for stroke including echocardiogram and carotid dopplers.  The patient has been monitored on telemetry which has demonstrated sinus rhythm with no arrhythmias.  Inpatient stroke work-up is to be completed with a TEE.   Echocardiogram 11/17 n demonstrated  Normal LV function  She was hospitalized at that time for TIA also r.  Lab work is reviewed.  Prior to admission, the patient denies chest pain, shortness of breath, dizziness, palpitations, or syncope.  They are recovering from their stroke with plans to go home at discharge.  EP has been asked to evaluate for placement of an implantable loop recorder to monitor for atrial fibrillation.     Past Medical History:  Diagnosis Date  . Asthma   . Coronary artery disease 2011   Cardiac cath in 2011 at [facility]: 50% mid LAD stenosis without evidence of obstructive disease. Normal ejection fraction. Nuclear stress test done in October of 2013 4 atypical chest pain was normal.  . Hyperlipidemia   . Hypertension   . PSVT (paroxysmal supraventricular tachycardia) ([place])   . Tachycardia 2010  . TIA (transient ischemic attack)      Surgical History:  Past Surgical History:  Procedure Laterality Date  . ABDOMINAL HYSTERECTOMY    . CARDIAC CATHETERIZATION  2011   50% to the mid LAD lesion  . PARTIAL HYSTERECTOMY  1999  . TONSILLECTOMY       Prescriptions Prior to Admission  Medication Sig Dispense Refill Last Dose  .  CARTIA XT 240 MG 24 hr capsule Take 240 mg by mouth daily.   06/08/2016 at 2000  . Cholecalciferol (VITAMIN D PO) Take by mouth daily.   06/09/2016 at 0930  . clopidogrel (PLAVIX) 75 MG tablet Take 1 tablet (75 mg total) by mouth daily. 90 tablet 3 06/09/2016 at 0930  . estradiol (ESTRACE) 0.5 MG tablet Take 0.5 mg by mouth daily.   06/09/2016 at 0930  . fluocinonide cream (LIDEX) [id number] % Apply 1 application topically as needed.    prn at prn  . lisinopril (PRINIVIL,ZESTRIL) 20 MG tablet Take 20 mg by mouth daily.   06/08/2016 at 2000  . loratadine (CLARITIN) 10 MG tablet Take 10 mg by mouth daily.   06/09/2016 at 0930  . metoprolol succinate (TOPROL-XL) 100 MG 24 hr tablet Take 100 mg by mouth daily. Take with or immediately following a meal.   06/08/2016 at 2000  . Misc Natural Products (GINKOGIN PO) Take by mouth daily.   06/09/2016 at 0930  . Multiple Vitamin (MULTIVITAMIN) tablet Take 1 tablet by mouth daily.   06/09/2016 at 0930  . mupirocin ointment (BACTROBAN) 2 % APPLY TOPICALLY THREE TIMES DAILY   prn at prn  . oxybutynin (DITROPAN) 5 MG tablet Take 5 mg by mouth daily.   06/08/2016 at 2000  . rosuvastatin (CRESTOR) 5 MG tablet Take 5 mg by mouth daily.   06/08/2016 at 2000  . Valerian Root 100 MG CAPS Take 100 mg by mouth at bedtime.   prn at prn  . VENTOLIN HFA 108 (90 BASE) MCG/ACT  inhaler Inhale 2 puffs into the lungs every 4 (four) hours as needed.    prn at prn  . vitamin C (ASCORBIC ACID) 500 MG tablet Take 500 mg by mouth daily.   06/09/2016 at 0930  . rosuvastatin (CRESTOR) 10 MG tablet Take 1 tablet (10 mg total) by mouth daily at 6 PM. 90 tablet 2     Inpatient Medications:  . aspirin EC  81 mg Oral Daily  . cholecalciferol  400 Units Oral Daily  . clopidogrel  75 mg Oral Daily  . diltiazem  240 mg Oral Daily  . enoxaparin (LOVENOX) injection  40 mg Subcutaneous Q24H  . lisinopril  20 mg Oral Daily  . loratadine  10 mg Oral Daily  . metoprolol succinate  100 mg Oral Daily  .  multivitamin with minerals  1 tablet Oral Daily  . oxybutynin  5 mg Oral Daily  . rosuvastatin  5 mg Oral Q24H  . senna  1 tablet Oral BID  . vitamin C  500 mg Oral Daily    Allergies:  Allergies  Allergen Reactions  . Sulfa Antibiotics     Social History   Social History  . Marital status: Married    Spouse name: N/A  . Number of children: N/A  . Years of education: N/A   Occupational History  . Not on file.   Social History Main Topics  . Smoking status: Never Smoker  . Smokeless tobacco: Never Used  . Alcohol use No  . Drug use: No  . Sexual activity: Not on file   Other Topics Concern  . Not on file   Social History Narrative  . No narrative on file     Family History  Problem Relation Age of Onset  . Hypertension Mother   . Heart attack Father 51    MI  . Heart disease Father   . Hypertension Father   . Hypertension Sister   . Hyperlipidemia Sister   . Heart attack Brother     cardiac arrest  . Hypertension Sister   . Hyperlipidemia Sister   . Breast cancer Neg Hx       Review of Systems: General: No chills, fever, night sweats or weight changes  Cardiovascular:  No chest pain, dyspnea on exertion, edema, orthopnea, palpitations, paroxysmal nocturnal dyspnea Dermatological: No rash, lesions or masses Respiratory: No cough, dyspnea Urologic: No hematuria, dysuria Abdominal: No nausea, vomiting, diarrhea, bright red blood per rectum, melena, or hematemesis Neurologic: No visual changes, weakness, changes in mental status All other systems reviewed and are otherwise negative except as noted above.  Physical Exam: Vitals:   06/10/16 1815 06/10/16 2132 06/11/16 0054 06/11/16 0459  BP: 131/86 126/72 (!) 128/51 120/63  Pulse: 88 77 72 67  Resp: 18 16 20 20   Temp: 98.8 F (37.1 C) 98.3 F (36.8 C) 97.9 F (36.6 C) 97.9 F (36.6 C)  TempSrc: Oral Oral Oral Oral  SpO2: 95% 95% 100% 100%  Weight:      Height:        GEN- The patient is well  appearing, alert and oriented x 3 today.   Head- normocephalic, atraumatic Eyes-  Sclera clear, conjunctiva pink Ears- hearing intact Oropharynx- clear Neck- supple Lungs- Clear to ausculation bilaterally, normal work of breathing Heart- Regular rate and rhythm, no murmurs, rubs or gallops  GI- soft, NT, ND, + BS Extremities- no clubbing, cyanosis, or edema MS- no significant deformity or atrophy Skin- no rash or lesion Psych- euthymic  mood, full affect   Labs:   Lab Results  Component Value Date   WBC 3.5 (L) 06/11/2016   HGB 13.2 06/11/2016   HCT 37.2 06/11/2016   MCV 81.7 06/11/2016   PLT 256 06/11/2016    Recent Labs Lab 06/09/16 1350 06/11/16 0540  NA 139 137  K 3.2* 3.7  CL 107 106  CO2 24 25  BUN 15 13  CREATININE 0.72 0.86  CALCIUM 9.0 9.2  PROT 7.4  --   BILITOT 1.0  --   ALKPHOS 60  --   ALT 24  --   AST 27  --   GLUCOSE 99 126*   Lab Results  Component Value Date   CKTOTAL 364 (H) 05/02/2012   CKMB 2.6 05/02/2012   TROPONINI <0.03 06/09/2016   Lab Results  Component Value Date   CHOL 134 06/10/2016   CHOL 157 05/25/2016   CHOL 208 (H) 04/11/2016   Lab Results  Component Value Date   HDL 32 (L) 06/10/2016   HDL 40 05/25/2016   HDL 36 (L) 04/11/2016   Lab Results  Component Value Date   LDLCALC 52 06/10/2016   LDLCALC 75 05/25/2016   LDLCALC 113 (H) 04/11/2016   Lab Results  Component Value Date   TRIG 251 (H) 06/10/2016   TRIG 212 (H) 05/25/2016   TRIG 294 (H) 04/11/2016   Lab Results  Component Value Date   CHOLHDL 4.2 06/10/2016   CHOLHDL 3.9 05/25/2016   CHOLHDL 5.8 04/11/2016   No results found for: LDLDIRECT  No results found for: DDIMER   Radiology/Studies: Ct Angio Head W Or Wo Contrast  Result Date: 06/10/2016 CLINICAL DATA:  Dizziness and vertigo since Sunday. EXAM: CT ANGIOGRAPHY HEAD AND NECK TECHNIQUE: Multidetector CT imaging of the head and neck was performed using the standard protocol during bolus  administration of intravenous contrast. Multiplanar CT image reconstructions and MIPs were obtained to evaluate the vascular anatomy. Carotid stenosis measurements (when applicable) are obtained utilizing NASCET criteria, using the distal internal carotid diameter as the denominator. CONTRAST:  75 cc Isovue 370 intravenous COMPARISON:  Brain MRI and MRA from yesterday FINDINGS: CT HEAD FINDINGS Brain: Known recent infarcts in the left parietal cortex are not visible. No new infarct noted. No hemorrhage, hydrocephalus, or mass. Vascular: No hyperdense vessel noted. Skull: No acute or aggressive finding. Sinuses: Large mucous retention cyst in the left maxillary antrum. Patchy mucosal thickening in the ethmoid sinuses. Orbits: Negative Review of the MIP images confirms the above findings CTA NECK FINDINGS Aortic arch: Negative.  Three vessel branching Right carotid system: Small calcified plaque at the brachiocephalic origin and bifurcation. Minimal plaque at the common carotid bifurcation. No stenosis, dissection, or beading. Left carotid system: Moderate calcified and noncalcified plaque at the common carotid bifurcation. No flow limiting stenosis, dissection, or ulceration. Vertebral arteries: Moderate atherosclerotic plaque at the left subclavian origin. No flow limiting stenosis. Right dominant vertebral artery. The vertebral arteries are smooth and patent. Skeleton: C5-6 and C6-7 ACDF. Questionable interbody fusion at C6-7, but no signs of ventral plate failure. Other neck: Paranasal sinus disease including large mucous retention cyst in the left maxillary antrum. Upper chest: Negative Review of the MIP images confirms the above findings CTA HEAD FINDINGS Anterior circulation: Hypoplastic right A1 segment. Bilateral fetal type PCA. No major branch occlusion or flow limiting stenosis. Negative for aneurysm. No beading. M1 irregularity noted on previous MRA is not seen on this study. Posterior circulation:  Hypoplastic left and aplastic  right P1 segments with fetal type PCAs. No major branch occlusion or flow limiting stenosis. Negative for aneurysm. Venous sinuses: Patent. Prominent emissary veins at the torcula draining into the suboccipital venous plexus. Anatomic variants: Described above Delayed phase: Negative for mass or parenchymal enhancement. Review of the MIP images confirms the above findings IMPRESSION: 1. No major branch occlusion, flow limiting stenosis, or embolic source identified. 2. Cervical carotid atherosclerosis with up to moderate plaque at the left ICA bulb. Electronically Signed   By: Monte Fantasia M.D.   On: 06/10/2016 15:39   Ct Head Wo Contrast  Result Date: 06/09/2016 CLINICAL DATA:  Dizziness for several days, nausea and weakness EXAM: CT HEAD WITHOUT CONTRAST TECHNIQUE: Contiguous axial images were obtained from the base of the skull through the vertex without intravenous contrast. COMPARISON:  MR brain of 04/11/2016 and CT brain of 04/10/2016 FINDINGS: Brain: The ventricular system is within normal limits in size and the septum is midline in position. The fourth ventricle and basilar cisterns are unremarkable. No hemorrhage, mass lesion, or acute infarction is seen. Vascular: No vascular abnormality is seen on this unenhanced study. Skull: On bone window images, no calvarial abnormality is seen. Sinuses/Orbits: There is a retention cyst within the left maxillary sinus. Opacification of a few ethmoid air cells is noted. Otherwise the paranasal sinuses are well pneumatized. Other: None. IMPRESSION: 1. Negative unenhanced CT of the brain. 2. Probable retention cysts in the left maxillary sinus with some opacification of a few ethmoid air cells. Electronically Signed   By: Ivar Drape M.D.   On: 06/09/2016 13:35   Ct Angio Neck W Or Wo Contrast  Result Date: 06/10/2016 CLINICAL DATA:  Dizziness and vertigo since Sunday. EXAM: CT ANGIOGRAPHY HEAD AND NECK TECHNIQUE: Multidetector CT  imaging of the head and neck was performed using the standard protocol during bolus administration of intravenous contrast. Multiplanar CT image reconstructions and MIPs were obtained to evaluate the vascular anatomy. Carotid stenosis measurements (when applicable) are obtained utilizing NASCET criteria, using the distal internal carotid diameter as the denominator. CONTRAST:  75 cc Isovue 370 intravenous COMPARISON:  Brain MRI and MRA from yesterday FINDINGS: CT HEAD FINDINGS Brain: Known recent infarcts in the left parietal cortex are not visible. No new infarct noted. No hemorrhage, hydrocephalus, or mass. Vascular: No hyperdense vessel noted. Skull: No acute or aggressive finding. Sinuses: Large mucous retention cyst in the left maxillary antrum. Patchy mucosal thickening in the ethmoid sinuses. Orbits: Negative Review of the MIP images confirms the above findings CTA NECK FINDINGS Aortic arch: Negative.  Three vessel branching Right carotid system: Small calcified plaque at the brachiocephalic origin and bifurcation. Minimal plaque at the common carotid bifurcation. No stenosis, dissection, or beading. Left carotid system: Moderate calcified and noncalcified plaque at the common carotid bifurcation. No flow limiting stenosis, dissection, or ulceration. Vertebral arteries: Moderate atherosclerotic plaque at the left subclavian origin. No flow limiting stenosis. Right dominant vertebral artery. The vertebral arteries are smooth and patent. Skeleton: C5-6 and C6-7 ACDF. Questionable interbody fusion at C6-7, but no signs of ventral plate failure. Other neck: Paranasal sinus disease including large mucous retention cyst in the left maxillary antrum. Upper chest: Negative Review of the MIP images confirms the above findings CTA HEAD FINDINGS Anterior circulation: Hypoplastic right A1 segment. Bilateral fetal type PCA. No major branch occlusion or flow limiting stenosis. Negative for aneurysm. No beading. M1  irregularity noted on previous MRA is not seen on this study. Posterior circulation: Hypoplastic left and  aplastic right P1 segments with fetal type PCAs. No major branch occlusion or flow limiting stenosis. Negative for aneurysm. Venous sinuses: Patent. Prominent emissary veins at the torcula draining into the suboccipital venous plexus. Anatomic variants: Described above Delayed phase: Negative for mass or parenchymal enhancement. Review of the MIP images confirms the above findings IMPRESSION: 1. No major branch occlusion, flow limiting stenosis, or embolic source identified. 2. Cervical carotid atherosclerosis with up to moderate plaque at the left ICA bulb. Electronically Signed   By: Monte Fantasia M.D.   On: 06/10/2016 15:39   Mr Angiogram Head Wo Contrast  Result Date: 06/09/2016 CLINICAL DATA:  71 y/o F; severe vertigo, gait instability, history of TIA. EXAM: MRI HEAD WITHOUT CONTRAST MRA HEAD WITHOUT CONTRAST TECHNIQUE: Multiplanar, multiecho pulse sequences of the brain and surrounding structures were obtained without intravenous contrast. Angiographic images of the head were obtained using MRA technique without contrast. COMPARISON:  06/09/2016 CT head. MRI and MRA of head dated 04/11/2016. FINDINGS: MRI HEAD FINDINGS Brain: Scattered subcentimeter foci of diffusion restriction within the left parietal cortex consistent with acute to early subacute infarction. Several small foci of T2 FLAIR hyperintensity in the left frontal lobe correspond to areas of acute ischemia on prior MRI in are compatible with encephalomalacia sequelae of chronic infarction. No abnormal susceptibility hypointensity to indicate intracranial hemorrhage. No focal mass effect. No extra-axial collection. No hydrocephalus. Vascular: As below. Skull and upper cervical spine: Partially visualize cervical spondylosis in anterior cervical discectomy and fusion probably moderate canal stenosis at C3-4 and C4-5. Calvarium is  unremarkable. Sinuses/Orbits: Complete opacification of the left maxillary sinus with large retention cysts and partial opacification of ethmoid air cells. Chronic inflammatory changes of right maxillary sinus with atelectasis. Orbits are unremarkable. Other: None. MRA HEAD FINDINGS Internal carotid arteries: Patent. Mild irregularity of right greater than left paraclinoid internal carotid arteries without significant stenosis probably representing intracranial atherosclerosis. Anterior cerebral arteries:  Patent. Middle cerebral arteries: Patent. Mild irregularity of right M1 segment without significant stenosis probably representing atherosclerotic disease. Anterior communicating artery: Patent. Posterior communicating arteries: Patent. Large posterior communicating artery is diminutive P1 segments compatible with persistent fetal circulation bilaterally. Posterior cerebral arteries:  Patent. Basilar artery:  Patent. Vertebral arteries:  Patent. No evidence of high-grade stenosis, large vessel occlusion, or aneurysm. IMPRESSION: 1. Several scattered subcentimeter foci of diffusion restriction within left parietal cortex consistent with acute to early subacute infarction, probably representing embolic phenomenon. No hemorrhage identified. 2. Extensive paranasal sinus disease greatest in left maxillary sinus. 3. Patent circle of Willis without evidence for large vessel occlusion, high-grade stenosis, or aneurysm. 4. Intracranial atherosclerosis with non stenotic mild irregularity of right M1 and bilateral paraclinoid ICA. These results were called by telephone at the time of interpretation on 06/09/2016 at 6:55 pm to Dr. Hinda Kehr , who verbally acknowledged these results. Electronically Signed   By: Kristine Garbe M.D.   On: 06/09/2016 18:58   Mr Brain Wo Contrast  Result Date: 06/09/2016 CLINICAL DATA:  71 y/o F; severe vertigo, gait instability, history of TIA. EXAM: MRI HEAD WITHOUT CONTRAST MRA  HEAD WITHOUT CONTRAST TECHNIQUE: Multiplanar, multiecho pulse sequences of the brain and surrounding structures were obtained without intravenous contrast. Angiographic images of the head were obtained using MRA technique without contrast. COMPARISON:  06/09/2016 CT head. MRI and MRA of head dated 04/11/2016. FINDINGS: MRI HEAD FINDINGS Brain: Scattered subcentimeter foci of diffusion restriction within the left parietal cortex consistent with acute to early subacute infarction. Several small foci  of T2 FLAIR hyperintensity in the left frontal lobe correspond to areas of acute ischemia on prior MRI in are compatible with encephalomalacia sequelae of chronic infarction. No abnormal susceptibility hypointensity to indicate intracranial hemorrhage. No focal mass effect. No extra-axial collection. No hydrocephalus. Vascular: As below. Skull and upper cervical spine: Partially visualize cervical spondylosis in anterior cervical discectomy and fusion probably moderate canal stenosis at C3-4 and C4-5. Calvarium is unremarkable. Sinuses/Orbits: Complete opacification of the left maxillary sinus with large retention cysts and partial opacification of ethmoid air cells. Chronic inflammatory changes of right maxillary sinus with atelectasis. Orbits are unremarkable. Other: None. MRA HEAD FINDINGS Internal carotid arteries: Patent. Mild irregularity of right greater than left paraclinoid internal carotid arteries without significant stenosis probably representing intracranial atherosclerosis. Anterior cerebral arteries:  Patent. Middle cerebral arteries: Patent. Mild irregularity of right M1 segment without significant stenosis probably representing atherosclerotic disease. Anterior communicating artery: Patent. Posterior communicating arteries: Patent. Large posterior communicating artery is diminutive P1 segments compatible with persistent fetal circulation bilaterally. Posterior cerebral arteries:  Patent. Basilar artery:   Patent. Vertebral arteries:  Patent. No evidence of high-grade stenosis, large vessel occlusion, or aneurysm. IMPRESSION: 1. Several scattered subcentimeter foci of diffusion restriction within left parietal cortex consistent with acute to early subacute infarction, probably representing embolic phenomenon. No hemorrhage identified. 2. Extensive paranasal sinus disease greatest in left maxillary sinus. 3. Patent circle of Willis without evidence for large vessel occlusion, high-grade stenosis, or aneurysm. 4. Intracranial atherosclerosis with non stenotic mild irregularity of right M1 and bilateral paraclinoid ICA. These results were called by telephone at the time of interpretation on 06/09/2016 at 6:55 pm to Dr. Hinda Kehr , who verbally acknowledged these results. Electronically Signed   By: Kristine Garbe M.D.   On: 06/09/2016 18:58   US Carotid Bilateral  Result Date: 06/10/2016 CLINICAL DATA:  Stroke.  History of hypertension and hyperlipidemia. EXAM: BILATERAL CAROTID DUPLEX ULTRASOUND TECHNIQUE: Pearline Cables scale imaging, color Doppler and duplex ultrasound were performed of bilateral carotid and vertebral arteries in the neck. COMPARISON:  04/10/2016 FINDINGS: Criteria: Quantification of carotid stenosis is based on velocity parameters that correlate the residual internal carotid diameter with NASCET-based stenosis levels, using the diameter of the distal internal carotid lumen as the denominator for stenosis measurement. The following velocity measurements were obtained: RIGHT ICA:  98/13 cm/sec CCA:  [id number] cm/sec SYSTOLIC ICA/CCA RATIO:  1.0 DIASTOLIC ICA/CCA RATIO:  0.9 ECA:  79 cm/sec LEFT ICA:  102/24 cm/sec CCA:  [id number] cm/sec SYSTOLIC ICA/CCA RATIO:  1.0 DIASTOLIC ICA/CCA RATIO:  1.3 ECA:  150 cm/sec RIGHT CAROTID ARTERY: There is a minimal amount of eccentric mixed echogenic plaque within the right carotid bulb (images 14 and 16), extending to involve the origin and proximal aspect of the  right internal carotid artery (image 24), morphologically similar to the 03/2016 examination and again not resulting in elevated peak systolic velocities within the interrogated course of the right internal carotid artery to suggest a hemodynamically significant stenosis. RIGHT VERTEBRAL ARTERY:  Antegrade Flow LEFT CAROTID ARTERY: There is a moderate amount of eccentric mixed echogenic plaque within the left carotid bulb (image 46), extending to involve the origin and proximal aspects of the left internal carotid artery (image 54), morphologically similar to the 03/2016 examination and again not resulting in elevated peak systolic velocities within the interrogated course the left internal carotid artery to suggest a hemodynamically significant stenosis. LEFT VERTEBRAL ARTERY:  Antegrade Flow IMPRESSION: Minimal to moderate amount of bilateral atherosclerotic plaque, left  greater than right, morphologically similar to the 03/2016 examination and again not resulting in a hemodynamically significant stenosis within either internal carotid artery. Electronically Signed   By: Sandi Mariscal M.D.   On: 06/10/2016 15:02    12-lead ECG sinus with normal intervals All prior EKG's in EPIC reviewed with no documented atrial fibrillation  SVT   Telemetry Personally reviewed sinus without any afib  Assessment and Plan:  1. Cryptogenic stroke--recurrent events  The patient presents with cryptogenic stroke.  The patient has a TEE planned for Mon  AM.  I spoke * at length with the patient and family about monitoring for afib with either a 30 day event monitor or an implantable loop recorder.  Risks, benefits, and alteratives to implantable loop recorder were discussed with the patient today.  She has previously used a 30 d monitor without elucidation    2 SVT adenosine responsive    We will plan LINQ insertion on Tuesday if pt still in house or can be done as an outpt.  If discharged just arrange with office a time  for LINQ insertion  Reviewed with family   Virl Axe, MD 06/11/2016 10:07 AM

## 2016-06-13 NOTE — Progress Notes (Addendum)
Subjective: Dizziness continues  Objective: Current vital signs: BP 133/64 (BP Location: Right Arm)   Pulse 88   Temp 98.5 F (36.9 C) (Oral)   Resp 18   Ht 5\' 2"  (1.575 m)   Wt 78.5 kg (173 lb)   SpO2 97%   BMI 31.64 kg/m  Vital signs in last 24 hours: Temp:  [97.8 F (36.6 C)-98.5 F (36.9 C)] 98.5 F (36.9 C) (01/29 1400) Pulse Rate:  [60-88] 88 (01/29 1400) Resp:  [11-22] 18 (01/29 1400) BP: (100-139)/(47-72) 133/64 (01/29 1400) SpO2:  [93 %-100 %] 97 % (01/29 1400) FiO2 (%):  [21 %] 21 % (01/28 1802)  Intake/Output from previous day: 01/28 0701 - 01/29 0700 In: 594 [P.O.:480; I.V.:114] Out: -  Intake/Output this shift: No intake/output data recorded. Nutritional status: DIET SOFT Room service appropriate? Yes; Fluid consistency: Thin  Neurologic Exam: Mental Status: Alert, oriented, thought content appropriate.  Speech fluent without evidence of aphasia.  Able to follow 3 step commands without difficulty. Cranial Nerves: II: Discs flat bilaterally; Visual fields grossly normal, pupils equal, round, reactive to light and accommodation III,IV, VI: ptosis not present, extra-ocular motions intact bilaterally V,VII: smile symmetric, facial light touch sensation normal bilaterally VIII: hearing normal bilaterally IX,X: gag reflex present XI: bilateral shoulder shrug XII: midline tongue extension Motor: Right :  Upper extremity   5/5                                      Left:     Upper extremity   5/5             Lower extremity   5/5                                                  Lower extremity   5/5 Tone and bulk:normal tone throughout; no atrophy noted Sensory: Pinprick and light touch intact throughout, bilaterally  Lab Results: Basic Metabolic Panel:  Recent Labs Lab 06/09/16 1350 06/11/16 0540  NA 139 137  K 3.2* 3.7  CL 107 106  CO2 24 25  GLUCOSE 99 126*  BUN 15 13  CREATININE 0.72 0.86  CALCIUM 9.0 9.2    Liver Function Tests:  Recent  Labs Lab 06/09/16 1350  AST 27  ALT 24  ALKPHOS 60  BILITOT 1.0  PROT 7.4  ALBUMIN 4.5   No results for input(s): LIPASE, AMYLASE in the last 168 hours. No results for input(s): AMMONIA in the last 168 hours.  CBC:  Recent Labs Lab 06/09/16 1350 06/11/16 0540  WBC 3.8 3.5*  NEUTROABS 1.8  --   HGB 13.5 13.2  HCT 38.1 37.2  MCV 82.3 81.7  PLT 302 256    Cardiac Enzymes:  Recent Labs Lab 06/09/16 1350  TROPONINI <0.03    Lipid Panel:  Recent Labs Lab 06/10/16 0420  CHOL 134  TRIG 251*  HDL 32*  CHOLHDL 4.2  VLDL 50*  LDLCALC 52    CBG: No results for input(s): GLUCAP in the last 168 hours.  Microbiology: No results found for this or any previous visit.  Coagulation Studies: No results for input(s): LABPROT, INR in the last 72 hours.  Imaging: No results found.  Medications:  I have reviewed the patient's current medications.  Scheduled: . aspirin EC  81 mg Oral Daily  . butamben-tetracaine-benzocaine      . cholecalciferol  400 Units Oral Daily  . clopidogrel  75 mg Oral Daily  . diltiazem  240 mg Oral Daily  . enoxaparin (LOVENOX) injection  40 mg Subcutaneous Q24H  . fentaNYL      . lidocaine      . lidocaine      . lisinopril  20 mg Oral Daily  . loratadine  10 mg Oral Daily  . metoprolol succinate  100 mg Oral Daily  . midazolam      . multivitamin with minerals  1 tablet Oral Daily  . oxybutynin  5 mg Oral Daily  . rosuvastatin  5 mg Oral Q24H  . senna  1 tablet Oral BID  . vitamin C  500 mg Oral Daily    Assessment/Plan: Work up unrevealing and includes a repeat carotid ultrasound and TEE.  A1c 5.9.  Patient on ASA and Plavix for discharge.  To follow up with neurology as an outpatient (requests [facility]).  Factor V ordered since this runs in the family.  Patient to have followed up as an outpatient.   Patient questions addressed.     LOS: 4 days   Alexis Goodell, MD Neurology [phone] 06/13/2016  3:09 PM

## 2016-06-13 NOTE — Progress Notes (Signed)
[place], [place].   06/13/2016  Patient: Kimberly Trevino   Date of Birth:  1945-07-23  Date of admission:  06/09/2016  Date of Discharge  06/13/2016    To Whom it May Concern:   Tramiyah Quaye  may return to work on 06/30/16.  PHYSICAL ACTIVITY:  Full  If you have any questions or concerns, please don't hesitate to call.  Sincerely,   [name] M.D Pager [phone] Office : [phone]   .

## 2016-06-14 ENCOUNTER — Encounter: Payer: Self-pay | Admitting: Cardiovascular Disease

## 2016-06-14 ENCOUNTER — Ambulatory Visit: Payer: Medicare Other | Admitting: Family

## 2016-06-15 LAB — FACTOR 5 LEIDEN

## 2016-06-16 NOTE — Progress Notes (Signed)
Letter sent.

## 2016-06-20 ENCOUNTER — Telehealth: Payer: Self-pay | Admitting: Cardiovascular Disease

## 2016-06-20 NOTE — Telephone Encounter (Signed)
S/w pt who understands she should contact Medical Records regarding recent hospital admission. She will come back by for [employer] letter.

## 2016-06-20 NOTE — Telephone Encounter (Signed)
Patient brought in letter form [employer].  She needs a letter stated the diagnosis for her 04/10/16-04/12/16 stay at [facility].  Per letter and roi please send to [phone].  Letter placed in nurse box.

## 2016-06-28 ENCOUNTER — Telehealth: Payer: Self-pay | Admitting: Cardiovascular Disease

## 2016-06-28 NOTE — Telephone Encounter (Signed)
FMLA/Disability Form from [place] placed in MD basket

## 2016-07-11 ENCOUNTER — Institutional Professional Consult (permissible substitution): Payer: Medicare Other | Admitting: Pulmonary Disease

## 2016-07-11 NOTE — Telephone Encounter (Signed)
Per discharge notes, pt requested referral to [facility] cardiology and neurology. Documentation in [place] indicates pt had Feb 7 OV with [facility] cardiologist, Dr. Durward Parcel with loop recorder implantation on Feb 8.  I have left a detailed message on pt home VM that paperwork would need to be completed by [facility] as she is now being followed by them. [facility] to return paperwork to patient. Provided CB number if questions.

## 2016-07-11 NOTE — Telephone Encounter (Signed)
Sent paperwork back to [facility] .(see previous phone note). Per Deno Etienne at [facility], they will return paperwork and refund pt $25

## 2016-07-11 NOTE — Telephone Encounter (Signed)
Per Dr. Fletcher Anon, [place] paperwork should be completed by PCP or neurology as pt had a stroke. Folder given to [place].

## 2016-10-17 ENCOUNTER — Other Ambulatory Visit: Payer: Self-pay | Admitting: Internal Medicine

## 2016-10-17 DIAGNOSIS — Z1231 Encounter for screening mammogram for malignant neoplasm of breast: Secondary | ICD-10-CM

## 2016-11-14 ENCOUNTER — Ambulatory Visit
Admission: RE | Admit: 2016-11-14 | Discharge: 2016-11-14 | Disposition: A | Discharge status: Home / Self Care | Payer: Medicare Other | Source: Ambulatory Visit | Attending: Internal Medicine | Admitting: Internal Medicine

## 2016-11-14 DIAGNOSIS — Z1231 Encounter for screening mammogram for malignant neoplasm of breast: Secondary | ICD-10-CM | POA: Insufficient documentation

## 2016-11-21 ENCOUNTER — Other Ambulatory Visit: Payer: Self-pay | Admitting: Internal Medicine

## 2016-11-21 DIAGNOSIS — R928 Other abnormal and inconclusive findings on diagnostic imaging of breast: Secondary | ICD-10-CM

## 2016-11-21 DIAGNOSIS — N6320 Unspecified lump in the left breast, unspecified quadrant: Secondary | ICD-10-CM

## 2016-11-22 ENCOUNTER — Ambulatory Visit
Admission: RE | Admit: 2016-11-22 | Discharge: 2016-11-22 | Disposition: A | Discharge status: Home / Self Care | Payer: Medicare Other | Source: Ambulatory Visit | Attending: Internal Medicine | Admitting: Internal Medicine

## 2016-11-22 DIAGNOSIS — N6320 Unspecified lump in the left breast, unspecified quadrant: Secondary | ICD-10-CM | POA: Diagnosis present

## 2016-11-22 DIAGNOSIS — N6002 Solitary cyst of left breast: Secondary | ICD-10-CM | POA: Insufficient documentation

## 2016-11-22 DIAGNOSIS — R928 Other abnormal and inconclusive findings on diagnostic imaging of breast: Secondary | ICD-10-CM

## 2017-02-22 ENCOUNTER — Other Ambulatory Visit: Payer: Self-pay | Admitting: Physician Assistant

## 2017-03-13 ENCOUNTER — Other Ambulatory Visit: Payer: Self-pay

## 2017-03-13 MED ORDER — CLOPIDOGREL BISULFATE 75 MG PO TABS: 75.0000 mg | ORAL_TABLET | Freq: Every day | ORAL | 0 refills | Status: DC

## 2017-03-13 NOTE — Telephone Encounter (Signed)
.  refill Requested Prescriptions   Signed Prescriptions Disp Refills  . clopidogrel (PLAVIX) 75 MG tablet 90 tablet 0    Sig: Take 1 tablet (75 mg total) by mouth daily.    Authorizing Provider: Rise Mu    Ordering User: Janan Ridge

## 2017-06-19 ENCOUNTER — Other Ambulatory Visit: Payer: Self-pay | Admitting: Physician Assistant

## 2017-10-16 ENCOUNTER — Encounter: Payer: Self-pay | Admitting: Urology

## 2017-10-16 ENCOUNTER — Ambulatory Visit: Payer: Medicare Other | Admitting: Urology

## 2017-10-16 VITALS — BP 133/78 | HR 69 | Ht 62.0 in | Wt 169.0 lb

## 2017-10-16 DIAGNOSIS — R32 Unspecified urinary incontinence: Secondary | ICD-10-CM | POA: Diagnosis not present

## 2017-10-16 DIAGNOSIS — N3946 Mixed incontinence: Secondary | ICD-10-CM | POA: Diagnosis not present

## 2017-10-16 LAB — URINALYSIS, COMPLETE
Bilirubin, UA: NEGATIVE
GLUCOSE, UA: NEGATIVE
Ketones, UA: NEGATIVE
LEUKOCYTES UA: NEGATIVE
NITRITE UA: NEGATIVE
Protein, UA: NEGATIVE
RBC, UA: NEGATIVE
SPEC GRAV UA: 1.02 (ref 1.005–1.030)
Urobilinogen, Ur: 0.2 mg/dL (ref 0.2–1.0)
pH, UA: 5 (ref 5.0–7.5)

## 2017-10-16 LAB — MICROSCOPIC EXAMINATION
RBC MICROSCOPIC, UA: NONE SEEN /HPF (ref 0–2)
WBC, UA: NONE SEEN /hpf (ref 0–5)

## 2017-10-16 LAB — BLADDER SCAN AMB NON-IMAGING: Scan Result: 13

## 2017-10-16 NOTE — Progress Notes (Signed)
10/16/2017 11:19 AM   v Kimberly Trevino 1945/09/03 [id number]  Referring provider: Burnard Hawthorne, FNP [address] [place], [place] [address]  Chief Complaint  Patient presents with  . Urinary Incontinence    New Patient    HPI: Consulted to assess the patient is urinary incontinence worsening over number years.  She leaks with coughing sneezing.  She does not leak with bending and lifting the primary symptom is urge incontinence with foot on the floor syndrome.  She can soak 3 pads a day.  She denies bedwetting  She gets up 5-6 times a night and does have some ankle edema.  She voids every 1-2 hours.  She has had a hysterectomy.  She has had a TIA.  She is on a medication and not responding  She denies history of kidney stones previous GU surgery and urinary tract infections.  She is not a diabetic.  Bowel movements are normal  Modifying factors: There are no other modifying factors  Associated signs and symptoms: There are no other associated signs and symptoms Aggravating and relieving factors: There are no other aggravating or relieving factors Severity: Moderate Duration: Persistent   PMH: Past Medical History:  Diagnosis Date  . Asthma   . Coronary artery disease 2011   Cardiac cath in 2011 at [facility]: 50% mid LAD stenosis without evidence of obstructive disease. Normal ejection fraction. Nuclear stress test done in October of 2013 4 atypical chest pain was normal.  . Hyperlipidemia   . Hypertension   . PSVT (paroxysmal supraventricular tachycardia) ([place])   . Tachycardia 2010  . TIA (transient ischemic attack)     Surgical History: Past Surgical History:  Procedure Laterality Date  . ABDOMINAL HYSTERECTOMY    . CARDIAC CATHETERIZATION  2011   50% to the mid LAD lesion  . PARTIAL HYSTERECTOMY  1999  . TEE WITHOUT CARDIOVERSION N/A 06/13/2016   Procedure: TRANSESOPHAGEAL ECHOCARDIOGRAM (TEE);  Surgeon: Wellington Hampshire, MD;  Location: [facility] ORS;   Service: Cardiovascular;  Laterality: N/A;  . TONSILLECTOMY      Home Medications:  Allergies as of 10/16/2017      Reactions   Doxycycline Itching   Other reaction(s): Angioedema   Sulfa Antibiotics       Medication List        Accurate as of 10/16/17 11:19 AM. Always use your most recent med list.          acetaminophen 325 MG tablet Commonly known as:  TYLENOL Take 2 tablets (650 mg total) by mouth every 6 (six) hours as needed for mild pain (or Fever >/= 101).   aspirin 81 MG EC tablet Take 1 tablet (81 mg total) by mouth daily.   CARTIA XT 240 MG 24 hr capsule Generic drug:  diltiazem Take 240 mg by mouth daily.   clopidogrel 75 MG tablet Commonly known as:  PLAVIX Take 1 tablet (75 mg total) by mouth daily.   estradiol 0.5 MG tablet Commonly known as:  ESTRACE Take 0.5 mg by mouth daily.   fluocinonide cream 0.05 % Commonly known as:  LIDEX Apply 1 application topically as needed.   GINKOGIN PO Take by mouth daily.   lisinopril 20 MG tablet Commonly known as:  PRINIVIL,ZESTRIL Take 20 mg by mouth daily.   loratadine 10 MG tablet Commonly known as:  CLARITIN Take 10 mg by mouth daily.   meclizine 25 MG tablet Commonly known as:  ANTIVERT Take 1 tablet (25 mg total) by  mouth 3 (three) times daily as needed for dizziness.   metoprolol succinate 100 MG 24 hr tablet Commonly known as:  TOPROL-XL Take 100 mg by mouth daily. Take with or immediately following a meal.   multivitamin tablet Take 1 tablet by mouth daily.   mupirocin ointment 2 % Commonly known as:  BACTROBAN APPLY TOPICALLY THREE TIMES DAILY   rosuvastatin 20 MG tablet Commonly known as:  CRESTOR Take 1 tablet (20 mg total) by mouth daily at 6 PM.   tolterodine 2 MG tablet Commonly known as:  DETROL Take 2 mg by mouth 2 (two) times daily.   Valerian Root 100 MG Caps Take 100 mg by mouth at bedtime.   VENTOLIN HFA 108 (90 Base) MCG/ACT inhaler Generic drug:  albuterol Inhale 2  puffs into the lungs every 4 (four) hours as needed.   vitamin C 500 MG tablet Commonly known as:  ASCORBIC ACID Take 500 mg by mouth daily.   VITAMIN D PO Take by mouth daily.       Allergies:  Allergies  Allergen Reactions  . Doxycycline Itching    Other reaction(s): Angioedema  . Sulfa Antibiotics     Family History: Family History  Problem Relation Age of Onset  . Hypertension Mother   . Heart attack Father 84       MI  . Heart disease Father   . Hypertension Father   . Hypertension Sister   . Hyperlipidemia Sister   . Heart attack Brother        cardiac arrest  . Hypertension Sister   . Hyperlipidemia Sister   . Breast cancer Neg Hx     Social History:  reports that she has never smoked. She has never used smokeless tobacco. She reports that she does not drink alcohol or use drugs.  ROS: UROLOGY Frequent Urination?: Yes Hard to postpone urination?: Yes Burning/pain with urination?: No Get up at night to urinate?: Yes Leakage of urine?: Yes Urine stream starts and stops?: No Trouble starting stream?: No Do you have to strain to urinate?: No Blood in urine?: No Urinary tract infection?: No Sexually transmitted disease?: No Injury to kidneys or bladder?: No Painful intercourse?: No Weak stream?: No Currently pregnant?: No Vaginal bleeding?: No Last menstrual period?: n  Gastrointestinal Nausea?: No Vomiting?: No Indigestion/heartburn?: No Diarrhea?: No Constipation?: No  Constitutional Fever: No Night sweats?: Yes Weight loss?: No Fatigue?: Yes  Skin Skin rash/lesions?: No Itching?: No  Eyes Blurred vision?: Yes Double vision?: No  Ears/Nose/Throat Sore throat?: No Sinus problems?: No  Hematologic/Lymphatic Swollen glands?: No Easy bruising?: Yes  Cardiovascular Leg swelling?: Yes Chest pain?: No  Respiratory Cough?: Yes Shortness of breath?: Yes  Endocrine Excessive thirst?: No  Musculoskeletal Back pain?:  Yes Joint pain?: Yes  Neurological Headaches?: Yes Dizziness?: Yes  Psychologic Depression?: Yes Anxiety?: Yes  Physical Exam: BP 133/78 (BP Location: Right Arm, Patient Position: Sitting, Cuff Size: Normal)   Pulse 69   Ht 5\' 2"  (1.575 m)   Wt 76.7 kg (169 lb)   BMI 30.91 kg/m   Constitutional:  Alert and oriented, No acute distress. HEENT: [place] AT, moist mucus membranes.  Trachea midline, no masses. Cardiovascular: No clubbing, cyanosis, or edema. Respiratory: Normal respiratory effort, no increased work of breathing. GI: Abdomen is soft, nontender, nondistended, no abdominal masses GU: Small high grade 2 cystocele and a distal grade 1 rectocele asymptomatic with no stress incontinence Skin: No rashes, bruises or suspicious lesions. Lymph: No cervical or inguinal adenopathy.  Neurologic: Grossly intact, no focal deficits, moving all 4 extremities. Psychiatric: Normal mood and affect.  Laboratory Data: Lab Results  Component Value Date   WBC 3.5 (L) 06/11/2016   HGB 13.2 06/11/2016   HCT 37.2 06/11/2016   MCV 81.7 06/11/2016   PLT 256 06/11/2016    Lab Results  Component Value Date   CREATININE 0.86 06/11/2016    No results found for: PSA  No results found for: TESTOSTERONE  Lab Results  Component Value Date   HGBA1C 5.9 (H) 06/10/2016    Urinalysis    Component Value Date/Time   COLORURINE COLORLESS (A) 04/10/2016 0924   APPEARANCEUR CLEAR (A) 04/10/2016 0924   LABSPEC 1.003 (L) 04/10/2016 0924   PHURINE 7.0 04/10/2016 0924   GLUCOSEU NEGATIVE 04/10/2016 0924   HGBUR NEGATIVE 04/10/2016 0924   BILIRUBINUR NEGATIVE 04/10/2016 0924   KETONESUR NEGATIVE 04/10/2016 0924   PROTEINUR NEGATIVE 04/10/2016 0924   NITRITE NEGATIVE 04/10/2016 0924   LEUKOCYTESUR NEGATIVE 04/10/2016 0924    Pertinent Imaging:   Assessment & Plan: Patient has mixed incontinence but primarily an overactive bladder.  She has frequency and nocturia.  Urine sent for culture.  She  is not responding to tolterodine.  By history she has failed oxybutynin.  She has taken Myrbetriq for short while but it was too expensive  I would like to see the patient back on Toviaz 8 mg samples.  If this does not reach her treatment goal we will perform cystoscopy on that visit and talk about 3 refractory therapies  1. Urinary incontinence, unspecified type  - Urinalysis, Complete - BLADDER SCAN AMB NON-IMAGING   No follow-ups on file.  Reece Packer, MD  [facility] [address] [place], [place] [address] [phone]

## 2017-10-20 LAB — CULTURE, URINE COMPREHENSIVE

## 2017-10-24 ENCOUNTER — Other Ambulatory Visit: Payer: Self-pay | Admitting: Internal Medicine

## 2017-10-24 DIAGNOSIS — Z1231 Encounter for screening mammogram for malignant neoplasm of breast: Secondary | ICD-10-CM

## 2017-10-26 ENCOUNTER — Telehealth: Payer: Self-pay

## 2017-10-26 MED ORDER — NITROFURANTOIN MACROCRYSTAL 100 MG PO CAPS: 100.0000 mg | ORAL_CAPSULE | Freq: Two times a day (BID) | ORAL | 0 refills | Status: AC

## 2017-10-26 NOTE — Telephone Encounter (Signed)
Pt informed

## 2017-10-26 NOTE — Telephone Encounter (Signed)
-----   Message from Bjorn Loser, MD sent at 10/23/2017 11:02 AM EDT ----- Macrodantin 100 mg twice a day for 7 days         ----- Message ----- From: Garnette Gunner, CMA Sent: 10/23/2017   8:36 AM To: Bjorn Loser, MD    ----- Message ----- From: Interface, [facility] Lab Results In Sent: 10/16/2017  11:37 AM To: [facility] Clinical

## 2017-11-20 ENCOUNTER — Ambulatory Visit: Payer: Medicare Other | Admitting: Urology

## 2017-11-20 ENCOUNTER — Encounter: Payer: Self-pay | Admitting: Urology

## 2017-11-20 VITALS — BP 134/78 | HR 82 | Ht 62.0 in | Wt 171.3 lb

## 2017-11-20 DIAGNOSIS — R32 Unspecified urinary incontinence: Secondary | ICD-10-CM

## 2017-11-20 DIAGNOSIS — N3946 Mixed incontinence: Secondary | ICD-10-CM

## 2017-11-20 LAB — URINALYSIS, COMPLETE
BILIRUBIN UA: NEGATIVE
GLUCOSE, UA: NEGATIVE
KETONES UA: NEGATIVE
LEUKOCYTES UA: NEGATIVE
Nitrite, UA: NEGATIVE
PROTEIN UA: NEGATIVE
RBC UA: NEGATIVE
SPEC GRAV UA: 1.02 (ref 1.005–1.030)
Urobilinogen, Ur: 2 mg/dL — ABNORMAL HIGH (ref 0.2–1.0)
pH, UA: 6 (ref 5.0–7.5)

## 2017-11-20 LAB — MICROSCOPIC EXAMINATION
Bacteria, UA: NONE SEEN
RBC, UA: NONE SEEN /hpf (ref 0–2)
WBC, UA: NONE SEEN /hpf (ref 0–5)

## 2017-11-20 MED ORDER — CIPROFLOXACIN HCL 500 MG PO TABS
500.0000 mg | ORAL_TABLET | Freq: Once | ORAL | Status: AC
  Administered 2017-11-20: 500 mg via ORAL

## 2017-11-20 MED ORDER — LIDOCAINE HCL URETHRAL/MUCOSAL 2 % EX GEL
1.0000 "application " | Freq: Once | CUTANEOUS | Status: AC
  Administered 2017-11-20: 1 via URETHRAL

## 2017-11-20 NOTE — Progress Notes (Signed)
11/20/2017 3:20 PM   Kimberly Trevino [birth year] [id number]  Referring provider: Burnard Hawthorne, FNP [address] [place], [place] [address]  Chief Complaint  Patient presents with  . Cysto    HPI: Consulted to assess the patient is urinary incontinence worsening over number years.  She leaks with coughing sneezing.  She does not leak with bending and lifting the primary symptom is urge incontinence with foot on the floor syndrome.  She can soak 3 pads a day.  She denies bedwetting  She gets up 5-6 times a night and does have some ankle edema.  She voids every 1-2 hours.  She has had a hysterectomy.  She has had a TIA.  She is on a medication and not responding  GU: Small high grade 2 cystocele and a distal grade 1 rectocele asymptomatic with no stress incontinence  Patient has mixed incontinence but primarily an overactive bladder.  She has frequency and nocturia.  Urine sent for culture.  She is not responding to tolterodine.  By history she has failed oxybutynin.  She has taken Myrbetriq for short while but it was too expensive  I would like to see the patient back on Toviaz 8 mg samples.  If this does not reach her treatment goal we will perform cystoscopy on that visit and talk about 3 refractory therapies  Today Frequency stable.  Urine culture was positive and treated The patient does say her stress incontinence is quite mild when she coughs and sneezes and the main problem is the urge incontinence.  Though Toviaz did not help.  Cystoscopy: The patient underwent flexible cystoscopy utilizing sterile technique.  Bladder mucosa and trigone were normal.  There is no stitch foreign body or carcinoma.  There is no cystitis.   PMH: Past Medical History:  Diagnosis Date  . Asthma   . Coronary artery disease 2011   Cardiac cath in 2011 at [facility]: 50% mid LAD stenosis without evidence of obstructive disease. Normal ejection fraction. Nuclear stress test done  in October of 2013 4 atypical chest pain was normal.  . Hyperlipidemia   . Hypertension   . PSVT (paroxysmal supraventricular tachycardia) ([place])   . Tachycardia 2010  . TIA (transient ischemic attack)     Surgical History: Past Surgical History:  Procedure Laterality Date  . ABDOMINAL HYSTERECTOMY    . CARDIAC CATHETERIZATION  2011   50% to the mid LAD lesion  . PARTIAL HYSTERECTOMY  1999  . TEE WITHOUT CARDIOVERSION N/A 06/13/2016   Procedure: TRANSESOPHAGEAL ECHOCARDIOGRAM (TEE);  Surgeon: Wellington Hampshire, MD;  Location: [facility] ORS;  Service: Cardiovascular;  Laterality: N/A;  . TONSILLECTOMY      Home Medications:  Allergies as of 11/20/2017      Reactions   Doxycycline Itching   Other reaction(s): Angioedema   Sulfa Antibiotics       Medication List        Accurate as of 11/20/17  3:20 PM. Always use your most recent med list.          acetaminophen 325 MG tablet Commonly known as:  TYLENOL Take 2 tablets (650 mg total) by mouth every 6 (six) hours as needed for mild pain (or Fever >/= 101).   aspirin 81 MG EC tablet Take 1 tablet (81 mg total) by mouth daily.   CARTIA XT 240 MG 24 hr capsule Generic drug:  diltiazem Take 240 mg by mouth daily.   clopidogrel 75 MG tablet Commonly known as:  PLAVIX Take 1 tablet (75 mg total) by mouth daily.   estradiol 0.5 MG tablet Commonly known as:  ESTRACE Take 0.5 mg by mouth daily.   fluocinonide cream 0.05 % Commonly known as:  LIDEX Apply 1 application topically as needed.   GINKOGIN PO Take by mouth daily.   lisinopril 20 MG tablet Commonly known as:  PRINIVIL,ZESTRIL Take 20 mg by mouth daily.   loratadine 10 MG tablet Commonly known as:  CLARITIN Take 10 mg by mouth daily.   meclizine 25 MG tablet Commonly known as:  ANTIVERT Take 1 tablet (25 mg total) by mouth 3 (three) times daily as needed for dizziness.   metoprolol succinate 100 MG 24 hr tablet Commonly known as:  TOPROL-XL Take 100 mg by  mouth daily. Take with or immediately following a meal.   multivitamin tablet Take 1 tablet by mouth daily.   mupirocin ointment 2 % Commonly known as:  BACTROBAN APPLY TOPICALLY THREE TIMES DAILY   rosuvastatin 20 MG tablet Commonly known as:  CRESTOR Take 1 tablet (20 mg total) by mouth daily at 6 PM.   tolterodine 2 MG tablet Commonly known as:  DETROL Take 2 mg by mouth 2 (two) times daily.   Valerian Root 100 MG Caps Take 100 mg by mouth at bedtime.   VENTOLIN HFA 108 (90 Base) MCG/ACT inhaler Generic drug:  albuterol Inhale 2 puffs into the lungs every 4 (four) hours as needed.   vitamin C 500 MG tablet Commonly known as:  ASCORBIC ACID Take 500 mg by mouth daily.   VITAMIN D PO Take by mouth daily.       Allergies:  Allergies  Allergen Reactions  . Doxycycline Itching    Other reaction(s): Angioedema  . Sulfa Antibiotics     Family History: Family History  Problem Relation Age of Onset  . Hypertension Mother   . Heart attack Father 24       MI  . Heart disease Father   . Hypertension Father   . Hypertension Sister   . Hyperlipidemia Sister   . Heart attack Brother        cardiac arrest  . Hypertension Sister   . Hyperlipidemia Sister   . Breast cancer Neg Hx     Social History:  reports that she has never smoked. She has never used smokeless tobacco. She reports that she does not drink alcohol or use drugs.  ROS:                                        Physical Exam: There were no vitals taken for this visit.    Laboratory Data: Lab Results  Component Value Date   WBC 3.5 (L) 06/11/2016   HGB 13.2 06/11/2016   HCT 37.2 06/11/2016   MCV 81.7 06/11/2016   PLT 256 06/11/2016    Lab Results  Component Value Date   CREATININE 0.86 06/11/2016    No results found for: PSA  No results found for: TESTOSTERONE  Lab Results  Component Value Date   HGBA1C 5.9 (H) 06/10/2016    Urinalysis    Component Value  Date/Time   COLORURINE COLORLESS (A) 04/10/2016 0924   APPEARANCEUR Clear 10/16/2017 1043   LABSPEC 1.003 (L) 04/10/2016 0924   PHURINE 7.0 04/10/2016 0924   GLUCOSEU Negative 10/16/2017 1043   HGBUR NEGATIVE 04/10/2016 0924   BILIRUBINUR Negative 10/16/2017 1043  KETONESUR NEGATIVE 04/10/2016 0924   PROTEINUR Negative 10/16/2017 1043   PROTEINUR NEGATIVE 04/10/2016 0924   NITRITE Negative 10/16/2017 1043   NITRITE NEGATIVE 04/10/2016 0924   LEUKOCYTESUR Negative 10/16/2017 1043    Pertinent Imaging:   Assessment & Plan: I discussed 3 refractory therapies with my usual templates.  Handout given on all 3.  At this time she would like to proceed with percutaneous tibial nerve stimulation.  1. Urinary incontinence, unspecified type  - Urinalysis, Complete - ciprofloxacin (CIPRO) tablet 500 mg - lidocaine (XYLOCAINE) 2 % jelly 1 application   No follow-ups on file.  Reece Packer, MD  [facility] [address], [place], [place] [address] [phone]

## 2017-11-21 ENCOUNTER — Telehealth: Payer: Self-pay | Admitting: Urology

## 2017-11-21 NOTE — Telephone Encounter (Signed)
Left message on patient's home phone number for her to call the office to schedule PTNS appointments.

## 2017-11-21 NOTE — Telephone Encounter (Signed)
PTNS Approved by patient's insurance [facility] [phone]).  Spoke to [place].  Prior authorization is NOT required.  Ref# [id number].

## 2017-12-05 IMAGING — MG MM DIGITAL SCREENING BILAT W/ CAD
6 series · 6 of 6 positions shown · non-contrast
Comparison: Previous exam(s).

CLINICAL DATA: Screening.

EXAM:
DIGITAL SCREENING BILATERAL MAMMOGRAM WITH CAD

[L MLO (1 of 2)]
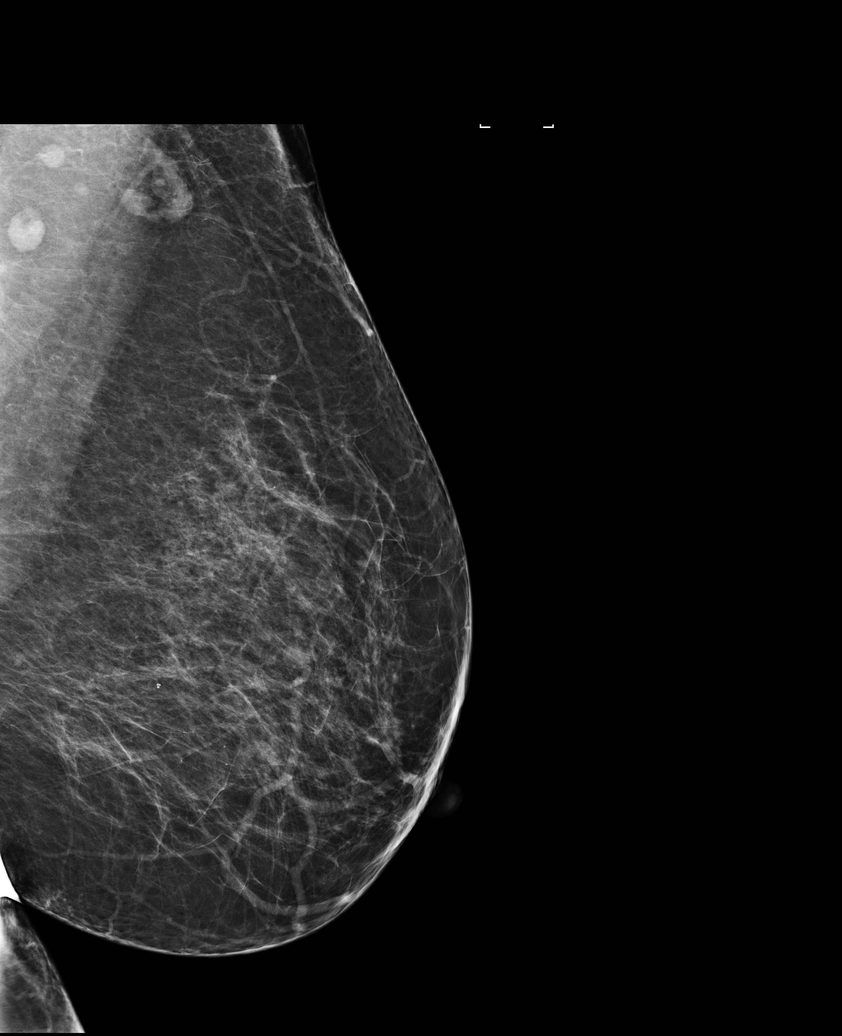

[R MLO]
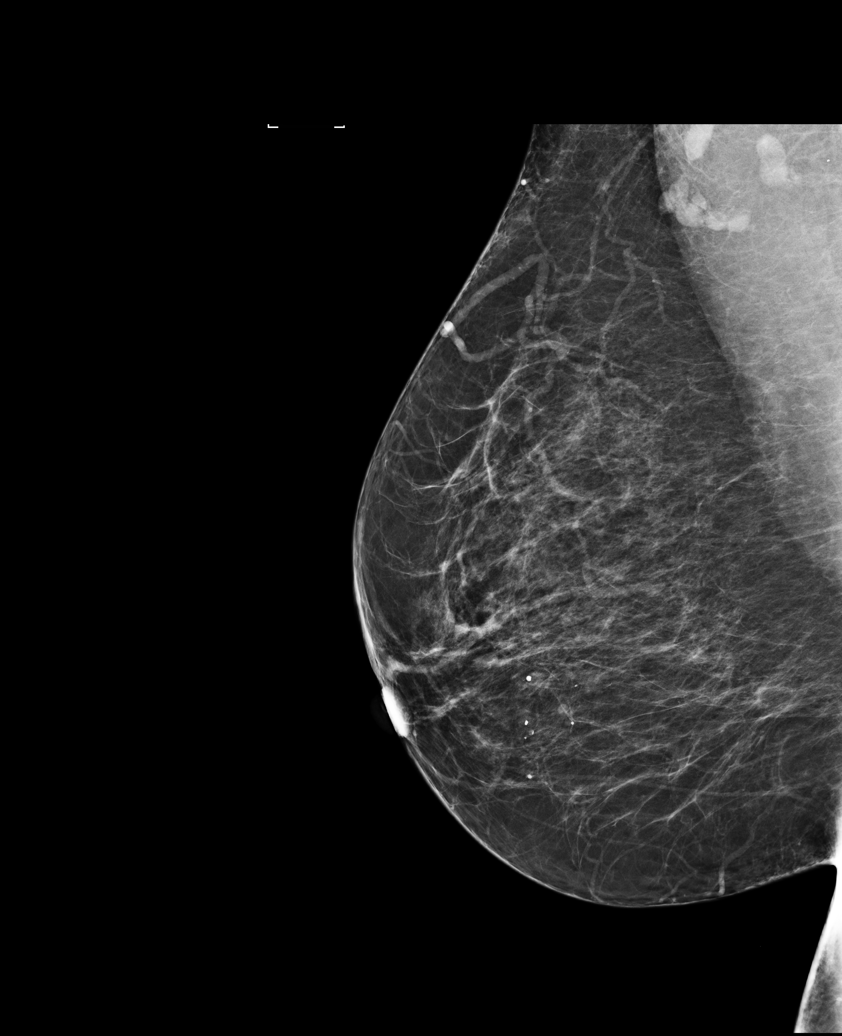

[L CC (1 of 2)]
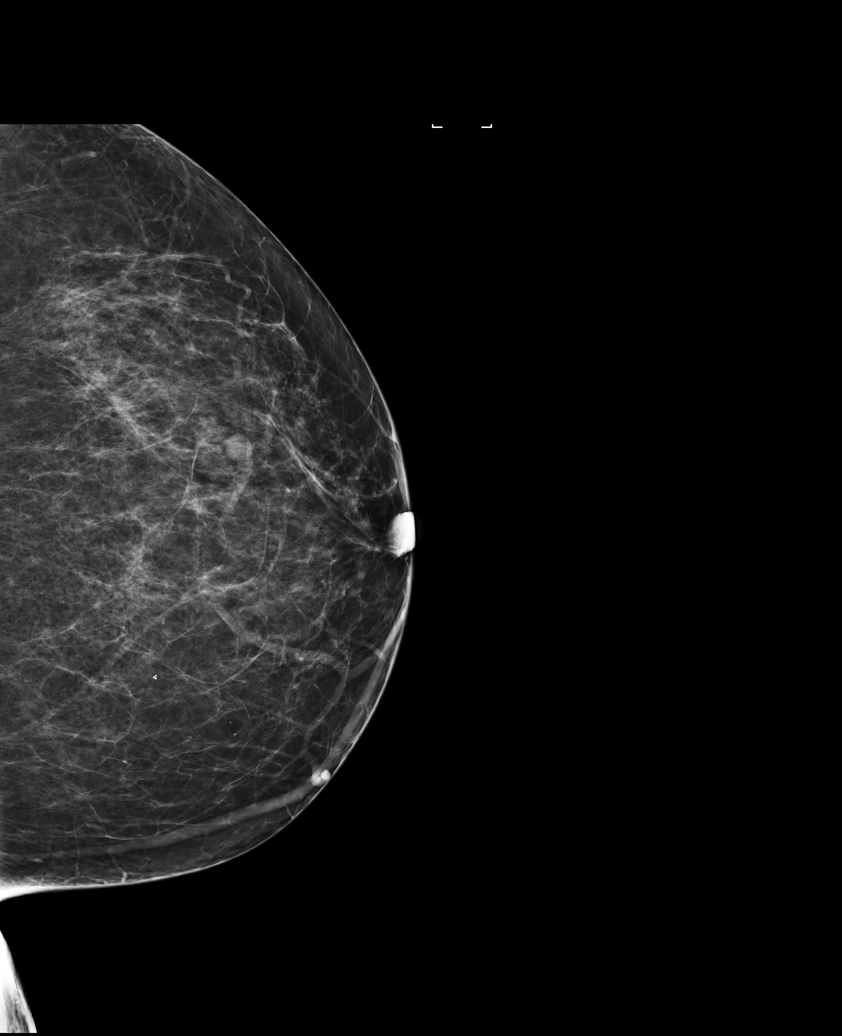

[R CC]
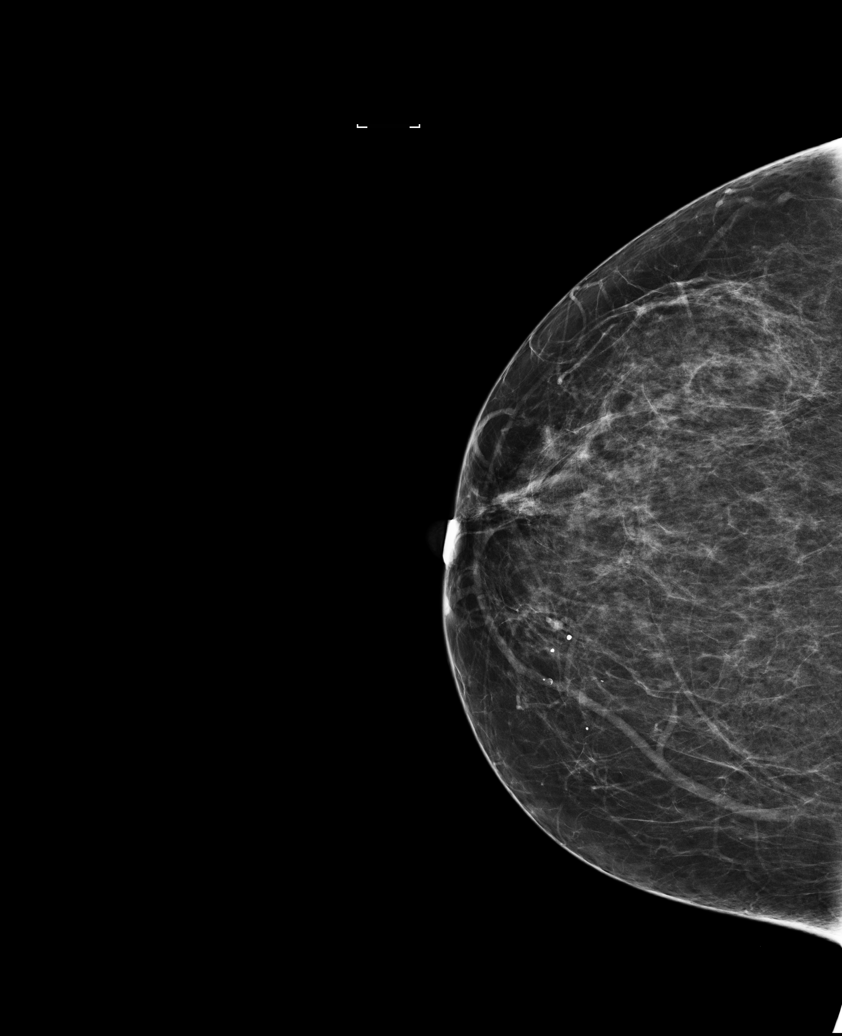

[L MLO (2 of 2)]
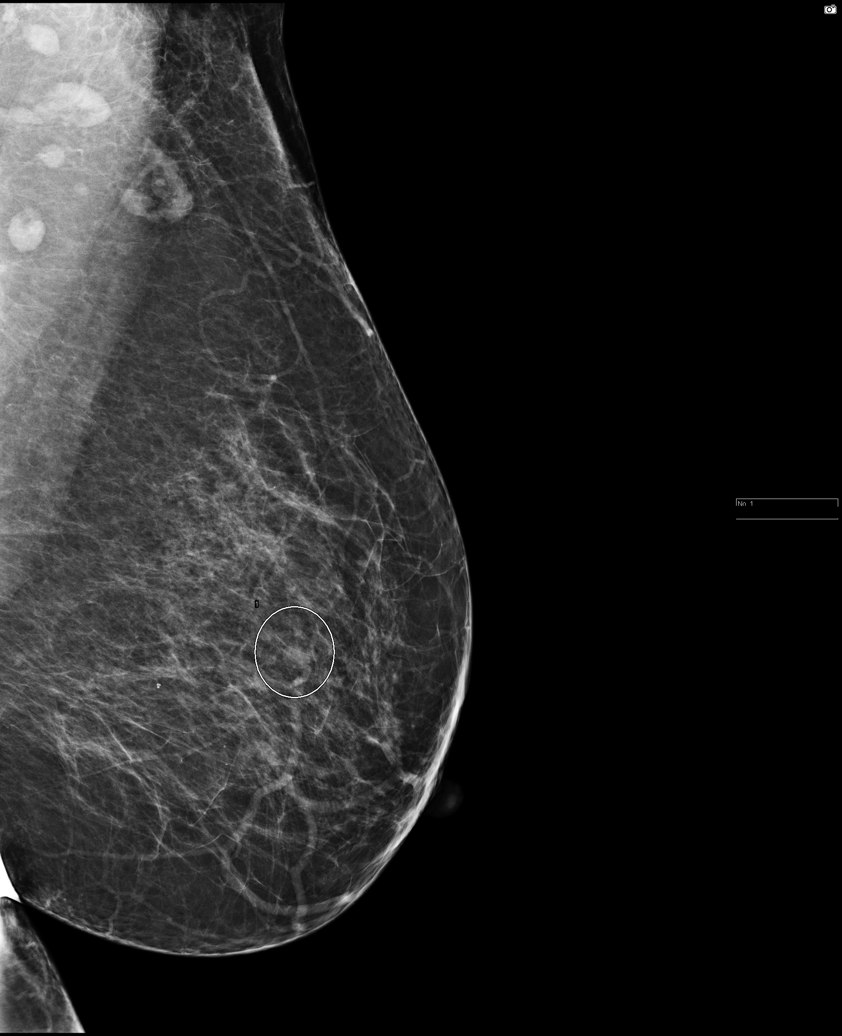

[L CC (2 of 2)]
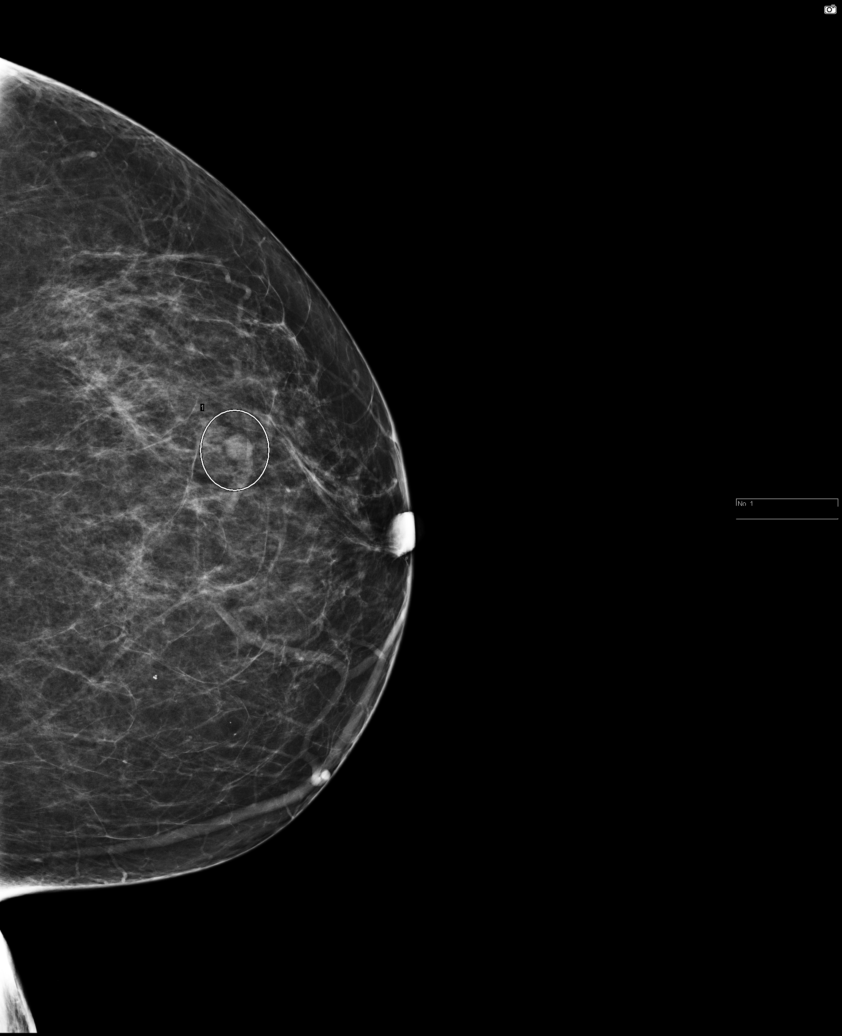

[6 of 6 positions shown; findings below may reference images not displayed]

ACR Breast Density Category b: There are scattered areas of
fibroglandular density.
FINDINGS: In the left breast, a possible mass warrants further evaluation. In
the right breast, no findings suspicious for malignancy.

Images were processed with CAD.
IMPRESSION: Further evaluation is suggested for possible mass in the left
breast.

RECOMMENDATION:
Diagnostic mammogram and possibly ultrasound of the left breast.
(Code:5M-3-PPT)

The patient will be contacted regarding the findings, and additional
imaging will be scheduled.

BI-RADS CATEGORY  0: Incomplete. Need additional imaging evaluation
and/or prior mammograms for comparison.

## 2017-12-06 ENCOUNTER — Ambulatory Visit
Admission: RE | Admit: 2017-12-06 | Discharge: 2017-12-06 | Disposition: A | Discharge status: Home / Self Care | Payer: Medicare Other | Source: Ambulatory Visit | Attending: Internal Medicine | Admitting: Internal Medicine

## 2017-12-06 DIAGNOSIS — Z1231 Encounter for screening mammogram for malignant neoplasm of breast: Secondary | ICD-10-CM | POA: Insufficient documentation

## 2017-12-13 ENCOUNTER — Ambulatory Visit (INDEPENDENT_AMBULATORY_CARE_PROVIDER_SITE_OTHER): Payer: Medicare Other

## 2017-12-13 DIAGNOSIS — N3946 Mixed incontinence: Secondary | ICD-10-CM

## 2017-12-13 NOTE — Progress Notes (Signed)
PTNS  Session # 1  Health & Social Factors: None Caffeine: 1 Alcohol: 0 Daytime voids #per day: 16 Night-time voids #per night: 5 Urgency: Severe Incontinence Episodes #per day: 16 Ankle used: Right Treatment Setting: 10 Feeling/ Response: Sensory Comments: None  Preformed By: Gordy Clement, CMA (AAMA)  Follow Up: As scheduled

## 2017-12-20 ENCOUNTER — Ambulatory Visit (INDEPENDENT_AMBULATORY_CARE_PROVIDER_SITE_OTHER): Payer: Medicare Other

## 2017-12-20 DIAGNOSIS — N3946 Mixed incontinence: Secondary | ICD-10-CM

## 2017-12-20 NOTE — Progress Notes (Signed)
PTNS  Session # 2  Health & Social Factors: No Change Caffeine: 1 Alcohol: 0 Daytime voids #per day: 4-6 Night-time voids #per night: 2-4 Urgency: Strong Incontinence Episodes #per day: 4-6 Ankle used: Left Treatment Setting: 5 Feeling/ Response: Sensory Comments:   Preformed By: Cristie Hem, CMA

## 2017-12-27 ENCOUNTER — Ambulatory Visit (INDEPENDENT_AMBULATORY_CARE_PROVIDER_SITE_OTHER): Payer: Medicare Other

## 2017-12-27 DIAGNOSIS — N3946 Mixed incontinence: Secondary | ICD-10-CM | POA: Diagnosis not present

## 2017-12-27 NOTE — Progress Notes (Signed)
PTNS  Session # 3  Health & Social Factors: No Change Caffeine: 1 Alcohol: 0 Daytime voids #per day: 4-6 Night-time voids #per night: 2-4 Urgency: Strong Incontinence Episodes #per day: 2-4 Ankle used: Left Treatment Setting: 4 Feeling/ Response: Sensory Comments:   Preformed By: Cristie Hem, CMA  Assistant:   Follow Up: As Scheduled

## 2018-01-03 ENCOUNTER — Ambulatory Visit (INDEPENDENT_AMBULATORY_CARE_PROVIDER_SITE_OTHER): Payer: Medicare Other

## 2018-01-03 DIAGNOSIS — N3946 Mixed incontinence: Secondary | ICD-10-CM | POA: Diagnosis not present

## 2018-01-03 NOTE — Progress Notes (Signed)
PTNS  Session # 4  Health & Social Factors: No Change Caffeine: 1 Alcohol: 0 Daytime voids #per day: 4-6 Night-time voids #per night: 2-4 Urgency: Strong Incontinence Episodes #per day: 2-4 Ankle used: Left Treatment Setting: 7 Feeling/ Response: Sensory  Preformed By: Cristie Hem, CMA  Follow Up: As Scheduled

## 2018-01-10 ENCOUNTER — Ambulatory Visit (INDEPENDENT_AMBULATORY_CARE_PROVIDER_SITE_OTHER): Payer: Medicare Other

## 2018-01-10 DIAGNOSIS — N3946 Mixed incontinence: Secondary | ICD-10-CM

## 2018-01-10 NOTE — Progress Notes (Signed)
PTNS  Session # 5  Health & Social Factors: No Change Caffeine: 1 Alcohol: 0 Daytime voids #per day: 4-6 Night-time voids #per night: 2-4 Urgency: Strong Incontinence Episodes #per day: 2-4 Ankle used: Left Treatment Setting: 1 Feeling/ Response: Sensory Comments:   Preformed By: Cristie Hem, CMA   Follow Up: As Scheduled

## 2018-01-17 ENCOUNTER — Ambulatory Visit: Payer: Medicare Other | Admitting: Family Medicine

## 2018-01-17 DIAGNOSIS — N3946 Mixed incontinence: Secondary | ICD-10-CM

## 2018-01-17 NOTE — Progress Notes (Signed)
PTNS  Session # 5  Health & Social Factors: mixed incontinance Caffeine: 1 Alcohol: 0 Daytime voids #per day: 5-8 Night-time voids #per night: 4-5 Urgency: strong Incontinence Episodes #per day: 4 Ankle used: right Treatment Setting: 4 Feeling/ Response: sensory Comments: patient tolerated well  Preformed By: Elberta Leatherwood, CMA  Follow Up: 1 week

## 2018-01-22 ENCOUNTER — Telehealth: Payer: Self-pay | Admitting: Urology

## 2018-01-22 NOTE — Telephone Encounter (Signed)
Patient wants to stop PTNS treatments, she has had 6 and has not seen any improvement. She states that she is still wearing pads and wetting herself. She wants to know if she can stop them?   Please advise   Sharyn Lull

## 2018-01-23 NOTE — Telephone Encounter (Signed)
That is her choice I usually recommend 9 before stopping to see if it starts working

## 2018-01-23 NOTE — Telephone Encounter (Signed)
Left message for patient to return my call   Kimberly Trevino

## 2018-01-24 ENCOUNTER — Ambulatory Visit (INDEPENDENT_AMBULATORY_CARE_PROVIDER_SITE_OTHER): Payer: Medicare Other

## 2018-01-24 DIAGNOSIS — N3946 Mixed incontinence: Secondary | ICD-10-CM

## 2018-01-24 NOTE — Progress Notes (Signed)
PTNS  Session # 7  Health & Social Factors: No change  Caffeine: 1  Alcohol: None Daytime voids #per day: 5-8 Night-time voids #per night: 4-5 Urgency: Strong Incontinence Episodes #per day: 4 Ankle used: Right  Treatment Setting: 4 Feeling/ Response: Both Comments: N/A  Preformed By: Sherril Cong, CMA     Follow Up: As scheduled

## 2018-01-31 ENCOUNTER — Ambulatory Visit (INDEPENDENT_AMBULATORY_CARE_PROVIDER_SITE_OTHER): Payer: Medicare Other

## 2018-01-31 DIAGNOSIS — N3946 Mixed incontinence: Secondary | ICD-10-CM | POA: Diagnosis not present

## 2018-01-31 NOTE — Progress Notes (Signed)
PTNS  Session # 8  Health & Social Factors: none Caffeine: 1 Alcohol: 0 Daytime voids #per day: 8 Night-time voids #per night: 5 Urgency: strong Incontinence Episodes #per day: 4 Ankle used: right Treatment Setting: 5 Feeling/ Response: both Comments: none  Preformed By: Fonnie Jarvis, CMA   Follow Up: next week

## 2018-02-07 ENCOUNTER — Ambulatory Visit (INDEPENDENT_AMBULATORY_CARE_PROVIDER_SITE_OTHER): Payer: Medicare Other

## 2018-02-07 DIAGNOSIS — N3946 Mixed incontinence: Secondary | ICD-10-CM | POA: Diagnosis not present

## 2018-02-07 NOTE — Progress Notes (Signed)
PTNS  Session # 9  Health & Social Factors: No Change Caffeine: 1 Alcohol: 0 Daytime voids #per day: 8 Night-time voids #per night: 5 Urgency: Strong Incontinence Episodes #per day: 4 Ankle used: Left Treatment Setting: 5 Feeling/ Response: Both  Preformed By: Cristie Hem, CMA  Follow Up: As Scheduled

## 2018-02-14 ENCOUNTER — Ambulatory Visit (INDEPENDENT_AMBULATORY_CARE_PROVIDER_SITE_OTHER): Payer: Medicare Other | Admitting: Family Medicine

## 2018-02-14 DIAGNOSIS — N3946 Mixed incontinence: Secondary | ICD-10-CM

## 2018-02-14 NOTE — Progress Notes (Signed)
PTNS  Session # 10  Health & Social Factors: no change Caffeine: 1 Alcohol: 0 Daytime voids #per day: 8 Night-time voids #per night: 4 Urgency: strong Incontinence Episodes #per day: 2 Ankle used: right Treatment Setting: 5 Feeling/ Response: both Comments: patient tolerated well.  Preformed By: Elberta Leatherwood, CMA  Follow Up: 1 week #11

## 2018-02-21 ENCOUNTER — Ambulatory Visit (INDEPENDENT_AMBULATORY_CARE_PROVIDER_SITE_OTHER): Payer: Medicare Other

## 2018-02-21 DIAGNOSIS — N3946 Mixed incontinence: Secondary | ICD-10-CM

## 2018-02-21 NOTE — Progress Notes (Signed)
PTNS  Session # 11  Health & Social Factors: no change Caffeine: 1 Alcohol: 0 Daytime voids #per day: 8 Night-time voids #per night: 4 Urgency: Strong Incontinence Episodes #per day: 1 Ankle used: Left Treatment Setting: 5 Feeling/ Response: Sensory  Preformed By: Cristie Hem, CMA   Follow Up: 1 week

## 2018-02-28 ENCOUNTER — Ambulatory Visit (INDEPENDENT_AMBULATORY_CARE_PROVIDER_SITE_OTHER): Payer: Medicare Other | Admitting: Family Medicine

## 2018-02-28 DIAGNOSIS — N3946 Mixed incontinence: Secondary | ICD-10-CM

## 2018-02-28 NOTE — Progress Notes (Signed)
PTNS  Session # 12  Health & Social Factors: no change Caffeine: 1 Alcohol: 0 Daytime voids #per day: 8 Night-time voids #per night: 4 Urgency: Strong Incontinence Episodes #per day: 2 Ankle used: right Treatment Setting: 5 Feeling/ Response: Sensory Comments: Patient tolerated well.  Preformed By: Elberta Leatherwood, CMA  Follow Up: 1 month with Larene Beach

## 2018-02-28 NOTE — Addendum Note (Signed)
Addended by: Kyra Manges on: 02/28/2018 04:03 PM   Modules accepted: Level of Service

## 2018-04-04 ENCOUNTER — Encounter: Payer: Self-pay | Admitting: Urology

## 2018-04-04 ENCOUNTER — Telehealth: Payer: Self-pay | Admitting: Urology

## 2018-04-04 ENCOUNTER — Ambulatory Visit: Payer: Medicare Other | Admitting: Urology

## 2018-04-04 VITALS — BP 150/81 | HR 82 | Ht 62.0 in | Wt 166.9 lb

## 2018-04-04 DIAGNOSIS — N3946 Mixed incontinence: Secondary | ICD-10-CM | POA: Diagnosis not present

## 2018-04-04 DIAGNOSIS — N3281 Overactive bladder: Secondary | ICD-10-CM | POA: Diagnosis not present

## 2018-04-04 MED ORDER — TOLTERODINE TARTRATE 2 MG PO TABS: 2.0000 mg | ORAL_TABLET | Freq: Two times a day (BID) | ORAL | 3 refills | Status: DC

## 2018-04-04 NOTE — Progress Notes (Signed)
04/04/2018 4:03 PM   Kimberly Trevino [birth year] [id number]  Referring provider: Tracie Harrier, [facility] [place], [place] [address]  Chief Complaint  Patient presents with  . Follow-up  . Post PTNS    HPI: Patient is 72 year old African-American female with mixed urinary incontinence and OAB who presents today for follow-up after completing 12 weeks of PTNS.  She has been being managed by Dr. Matilde Sprang.  She had failed oxybutynin and Toviaz.  She was on Myrbetriq for short time, but the medication was cost prohibitive.   Cystoscopy performed on November 20, 2016 was negative.    She completed the 12 week course of PTNS on 02/28/2018.   She states that she felt that it did not give her any help and reaching her goals.  She still having frequency x6, strong urgency and nocturia x3.  Patient denies any gross hematuria, dysuria or suprapubic/flank pain.  Patient denies any fevers, chills, nausea or vomiting.   She is currently on Detrol IR 2 mg daily.      PMH: Past Medical History:  Diagnosis Date  . Asthma   . Coronary artery disease 2011   Cardiac cath in 2011 at [facility]: 50% mid LAD stenosis without evidence of obstructive disease. Normal ejection fraction. Nuclear stress test done in October of 2013 4 atypical chest pain was normal.  . Hyperlipidemia   . Hypertension   . PSVT (paroxysmal supraventricular tachycardia) ([place])   . Tachycardia 2010  . TIA (transient ischemic attack)     Surgical History: Past Surgical History:  Procedure Laterality Date  . ABDOMINAL HYSTERECTOMY    . CARDIAC CATHETERIZATION  2011   50% to the mid LAD lesion  . PARTIAL HYSTERECTOMY  1999  . TEE WITHOUT CARDIOVERSION N/A 06/13/2016   Procedure: TRANSESOPHAGEAL ECHOCARDIOGRAM (TEE);  Surgeon: Wellington Hampshire, MD;  Location: [facility] ORS;  Service: Cardiovascular;  Laterality: N/A;  . TONSILLECTOMY      Home Medications:  Allergies as of 04/04/2018    Reactions   Doxycycline Itching   Other reaction(s): Angioedema   Sulfa Antibiotics       Medication List        Accurate as of 04/04/18  4:03 PM. Always use your most recent med list.          acetaminophen 325 MG tablet Commonly known as:  TYLENOL Take 2 tablets (650 mg total) by mouth every 6 (six) hours as needed for mild pain (or Fever >/= 101).   aspirin 81 MG EC tablet Take 1 tablet (81 mg total) by mouth daily.   CARTIA XT 240 MG 24 hr capsule Generic drug:  diltiazem Take 240 mg by mouth daily.   clopidogrel 75 MG tablet Commonly known as:  PLAVIX Take 1 tablet (75 mg total) by mouth daily.   estradiol 0.5 MG tablet Commonly known as:  ESTRACE Take 0.5 mg by mouth daily.   fluocinonide cream 0.05 % Commonly known as:  LIDEX Apply 1 application topically as needed.   GINKOGIN PO Take by mouth daily.   lisinopril 20 MG tablet Commonly known as:  PRINIVIL,ZESTRIL Take 20 mg by mouth daily.   loratadine 10 MG tablet Commonly known as:  CLARITIN Take 10 mg by mouth daily.   meclizine 25 MG tablet Commonly known as:  ANTIVERT Take 1 tablet (25 mg total) by mouth 3 (three) times daily as needed for dizziness.   metoprolol succinate 100 MG 24 hr tablet Commonly known  as:  TOPROL-XL Take 100 mg by mouth daily. Take with or immediately following a meal.   multivitamin tablet Take 1 tablet by mouth daily.   mupirocin ointment 2 % Commonly known as:  BACTROBAN APPLY TOPICALLY THREE TIMES DAILY   rosuvastatin 20 MG tablet Commonly known as:  CRESTOR Take 1 tablet (20 mg total) by mouth daily at 6 PM.   tolterodine 2 MG tablet Commonly known as:  DETROL Take 2 mg by mouth 2 (two) times daily.   Valerian Root 100 MG Caps Take 100 mg by mouth at bedtime.   VENTOLIN HFA 108 (90 Base) MCG/ACT inhaler Generic drug:  albuterol Inhale 2 puffs into the lungs every 4 (four) hours as needed.   VITAMIN D PO Take by mouth daily.       Allergies:    Allergies  Allergen Reactions  . Doxycycline Itching    Other reaction(s): Angioedema  . Sulfa Antibiotics     Family History: Family History  Problem Relation Age of Onset  . Hypertension Mother   . Heart attack Father 34       MI  . Heart disease Father   . Hypertension Father   . Hypertension Sister   . Hyperlipidemia Sister   . Heart attack Brother        cardiac arrest  . Hypertension Sister   . Hyperlipidemia Sister   . Breast cancer Neg Hx     Social History:  reports that she has never smoked. She has never used smokeless tobacco. She reports that she does not drink alcohol or use drugs.  ROS: UROLOGY Frequent Urination?: Yes Hard to postpone urination?: Yes Burning/pain with urination?: No Get up at night to urinate?: Yes Leakage of urine?: No Urine stream starts and stops?: No Trouble starting stream?: No Do you have to strain to urinate?: No Blood in urine?: No Urinary tract infection?: No Sexually transmitted disease?: No Injury to kidneys or bladder?: No Painful intercourse?: No Weak stream?: No Currently pregnant?: No Vaginal bleeding?: No Last menstrual period?: n  Gastrointestinal Nausea?: No Vomiting?: No Indigestion/heartburn?: No Diarrhea?: No Constipation?: No  Constitutional Fever: No Weight loss?: No Fatigue?: No  Skin Skin rash/lesions?: No Itching?: No  Eyes Blurred vision?: No Double vision?: No  Ears/Nose/Throat Sore throat?: No  Hematologic/Lymphatic Swollen glands?: No Easy bruising?: No  Cardiovascular Leg swelling?: No Chest pain?: No  Respiratory Cough?: No Shortness of breath?: No  Endocrine Excessive thirst?: No  Musculoskeletal Back pain?: No Joint pain?: No  Neurological Headaches?: No Dizziness?: No  Psychologic Depression?: No Anxiety?: No  Physical Exam: BP (!) 150/81 (BP Location: Left Arm, Patient Position: Sitting, Cuff Size: Normal)   Pulse 82   Ht 5\' 2"  (1.575 m)   Wt 166  lb 14.4 oz (75.7 kg)   BMI 30.53 kg/m   Constitutional:  Well nourished. Alert and oriented, No acute distress. HEENT: [place] AT, moist mucus membranes.  Trachea midline, no masses. Cardiovascular: No clubbing, cyanosis, or edema. Respiratory: Normal respiratory effort, no increased work of breathing. Skin: No rashes, bruises or suspicious lesions. Neurologic: Grossly intact, no focal deficits, moving all 4 extremities. Psychiatric: Normal mood and affect.  Laboratory Data: Lab Results  Component Value Date   WBC 3.5 (L) 06/11/2016   HGB 13.2 06/11/2016   HCT 37.2 06/11/2016   MCV 81.7 06/11/2016   PLT 256 06/11/2016    Lab Results  Component Value Date   CREATININE 0.86 06/11/2016    No results found for:  PSA  No results found for: TESTOSTERONE  Lab Results  Component Value Date   HGBA1C 5.9 (H) 06/10/2016    Lab Results  Component Value Date   TSH 2.90 05/02/2012       Component Value Date/Time   CHOL 134 06/10/2016 0420   CHOL 157 05/25/2016 0746   HDL 32 (L) 06/10/2016 0420   HDL 40 05/25/2016 0746   CHOLHDL 4.2 06/10/2016 0420   VLDL 50 (H) 06/10/2016 0420   LDLCALC 52 06/10/2016 0420   LDLCALC 75 05/25/2016 0746    Lab Results  Component Value Date   AST 27 06/09/2016   Lab Results  Component Value Date   ALT 24 06/09/2016   No components found for: ALKALINEPHOPHATASE No components found for: BILIRUBINTOTAL  No results found for: ESTRADIOL  Urinalysis    Component Value Date/Time   COLORURINE COLORLESS (A) 04/10/2016 0924   APPEARANCEUR Clear 11/20/2017 1511   LABSPEC 1.003 (L) 04/10/2016 0924   PHURINE 7.0 04/10/2016 0924   GLUCOSEU Negative 11/20/2017 1511   HGBUR NEGATIVE 04/10/2016 0924   BILIRUBINUR Negative 11/20/2017 1511   [place] 04/10/2016 0924   PROTEINUR Negative 11/20/2017 1511   PROTEINUR NEGATIVE 04/10/2016 0924   NITRITE Negative 11/20/2017 1511   NITRITE NEGATIVE 04/10/2016 0924   LEUKOCYTESUR Negative  11/20/2017 1511    I have reviewed the labs.   Assessment & Plan:    1. Mixed incontinence Patient realized she was taking the Detrol IR 2 mg just once daily and noted the prescription was for twice daily She would like to try the medication 2 mg twice daily to see if this will give her to her goals She will return in 6 weeks with Dr. Matilde Sprang for symptom recheck and to discuss Botox if she does not reach goal with the Detrol  2. OAB See above  Return in about 6 weeks (around 05/16/2018) for To discuss Botox .  These notes generated with voice recognition software. I apologize for typographical errors.  Zara Council, PA-C  [facility] [address]  [place], [place] [address] [phone]

## 2018-04-04 NOTE — Telephone Encounter (Signed)
Pt called back wanting clarification on the instructions for her Detrol. Spoke with Larene Beach, [place] who verify that what was written is the correct instructions. Informed pt and then she asked if rx could be resent to [facility] order [facility], resent rx per her request. Nothing further is needed.

## 2018-04-16 ENCOUNTER — Telehealth: Payer: Self-pay | Admitting: Urology

## 2018-04-16 MED ORDER — TOLTERODINE TARTRATE 2 MG PO TABS: 2.0000 mg | ORAL_TABLET | Freq: Two times a day (BID) | ORAL | 3 refills | Status: DC

## 2018-04-16 NOTE — Telephone Encounter (Signed)
Pt follow up on Rx, pt states [facility] hasn't received anything from our office in regards to her Rx of Detrol. Please resend Rx to take twice daily to [facility]

## 2018-04-16 NOTE — Telephone Encounter (Signed)
Looks like the previous rx that I sent on the 20th printed and so I had to resend rx and called [facility] who verified that they have rx now. Attempted to reach pt but no answer, left vm to call back

## 2018-04-17 MED ORDER — OXYBUTYNIN CHLORIDE 5 MG PO TABS: 5.0000 mg | ORAL_TABLET | Freq: Three times a day (TID) | ORAL | 3 refills | Status: DC

## 2018-04-17 NOTE — Telephone Encounter (Signed)
I understand that insurance companies often force our hands.  Oxybutynin 5 mg is taken three times daily.  Is she okay with this?

## 2018-04-17 NOTE — Telephone Encounter (Signed)
Kimberly Trevino,   Pt called the office with her insurance on the phone to discuss covered alternatives because it would cost her over $200 for the Detrol. Per her insurance Oxybutyin 5mg  is a titer 2 drug that per pt she would be able to afford. Would you like to switch her to this? If so pt still would like a 90 day supply to [facility].

## 2018-04-17 NOTE — Telephone Encounter (Signed)
Pt called today stating that she couldn't afford the Detrol and wants to know if there are any options.  It would have cost her $275.

## 2018-04-17 NOTE — Telephone Encounter (Signed)
Patient notified and script sent to mail order

## 2018-04-17 NOTE — Telephone Encounter (Signed)
Spoke with pt and informed her I could send the message to [facility] to see about prescribing something else but we will not know if her insurance will cover it, offered for pt to reach out to her insurance for covered alternatives. She stated she would call them and then return her call. Nothing further is needed until she calls back

## 2018-05-21 ENCOUNTER — Ambulatory Visit: Payer: Medicare Other | Admitting: Urology

## 2018-08-26 ENCOUNTER — Other Ambulatory Visit: Payer: Self-pay | Admitting: Urology

## 2018-09-25 ENCOUNTER — Telehealth: Payer: Self-pay

## 2018-09-25 NOTE — Telephone Encounter (Signed)
Called patient from recall.  No answer. NO VM.  This is the second attempt per recall list.

## 2018-10-04 ENCOUNTER — Telehealth: Payer: Self-pay | Admitting: Cardiovascular Disease

## 2018-10-04 NOTE — Telephone Encounter (Signed)
Patient declined recall . Changed providers. Deleting recall.

## 2018-11-12 ENCOUNTER — Other Ambulatory Visit: Payer: Self-pay | Admitting: Internal Medicine

## 2018-11-12 DIAGNOSIS — Z1231 Encounter for screening mammogram for malignant neoplasm of breast: Secondary | ICD-10-CM

## 2018-11-29 ENCOUNTER — Other Ambulatory Visit: Payer: Self-pay | Admitting: *Deleted

## 2018-11-29 DIAGNOSIS — Z20822 Contact with and (suspected) exposure to covid-19: Secondary | ICD-10-CM

## 2018-12-04 LAB — NOVEL CORONAVIRUS, NAA: SARS-CoV-2, NAA: NOT DETECTED

## 2018-12-17 ENCOUNTER — Ambulatory Visit
Admission: RE | Admit: 2018-12-17 | Discharge: 2018-12-17 | Disposition: A | Discharge status: Home / Self Care | Payer: Medicare Other | Source: Ambulatory Visit | Attending: Internal Medicine | Admitting: Internal Medicine

## 2018-12-17 DIAGNOSIS — Z1231 Encounter for screening mammogram for malignant neoplasm of breast: Secondary | ICD-10-CM

## 2019-05-08 ENCOUNTER — Other Ambulatory Visit: Payer: Self-pay

## 2019-05-08 ENCOUNTER — Emergency Department
Admission: EM | Admit: 2019-05-08 | Discharge: 2019-05-08 | Disposition: A | Discharge status: Home / Self Care | Payer: Medicare Other | Attending: Emergency Medicine | Admitting: Emergency Medicine

## 2019-05-08 ENCOUNTER — Emergency Department: Payer: Medicare Other

## 2019-05-08 ENCOUNTER — Encounter: Payer: Self-pay | Admitting: Emergency Medicine

## 2019-05-08 DIAGNOSIS — Z8673 Personal history of transient ischemic attack (TIA), and cerebral infarction without residual deficits: Secondary | ICD-10-CM | POA: Diagnosis not present

## 2019-05-08 DIAGNOSIS — E860 Dehydration: Secondary | ICD-10-CM | POA: Diagnosis not present

## 2019-05-08 DIAGNOSIS — R531 Weakness: Secondary | ICD-10-CM

## 2019-05-08 DIAGNOSIS — I2510 Atherosclerotic heart disease of native coronary artery without angina pectoris: Secondary | ICD-10-CM | POA: Insufficient documentation

## 2019-05-08 DIAGNOSIS — I959 Hypotension, unspecified: Secondary | ICD-10-CM | POA: Insufficient documentation

## 2019-05-08 DIAGNOSIS — I10 Essential (primary) hypertension: Secondary | ICD-10-CM | POA: Insufficient documentation

## 2019-05-08 DIAGNOSIS — U071 COVID-19: Secondary | ICD-10-CM | POA: Insufficient documentation

## 2019-05-08 LAB — CBC
HCT: 40.4 % (ref 36.0–46.0)
Hemoglobin: 14.1 g/dL (ref 12.0–15.0)
MCH: 27.9 pg (ref 26.0–34.0)
MCHC: 34.9 g/dL (ref 30.0–36.0)
MCV: 80 fL (ref 80.0–100.0)
Platelets: 319 10*3/uL (ref 150–400)
RBC: 5.05 MIL/uL (ref 3.87–5.11)
RDW: 13 % (ref 11.5–15.5)
WBC: 8.9 10*3/uL (ref 4.0–10.5)
nRBC: 0 % (ref 0.0–0.2)

## 2019-05-08 LAB — BASIC METABOLIC PANEL
Anion gap: 13 (ref 5–15)
BUN: 24 mg/dL — ABNORMAL HIGH (ref 8–23)
CO2: 25 mmol/L (ref 22–32)
Calcium: 9.4 mg/dL (ref 8.9–10.3)
Chloride: 100 mmol/L (ref 98–111)
Creatinine, Ser: 1.39 mg/dL — ABNORMAL HIGH (ref 0.44–1.00)
GFR calc Af Amer: 44 mL/min — ABNORMAL LOW (ref >60)
GFR calc non Af Amer: 38 mL/min — ABNORMAL LOW (ref >60)
Glucose, Bld: 107 mg/dL — ABNORMAL HIGH (ref 70–99)
Potassium: 3.5 mmol/L (ref 3.5–5.1)
Sodium: 138 mmol/L (ref 135–145)

## 2019-05-08 MED ORDER — SODIUM CHLORIDE 0.9 % IV SOLN: Freq: Once | INTRAVENOUS | Status: AC

## 2019-05-08 MED ORDER — SODIUM CHLORIDE 0.9% FLUSH: 3.0000 mL | Freq: Once | INTRAVENOUS | Status: DC

## 2019-05-08 NOTE — ED Triage Notes (Signed)
Pt presents to ED from [facility] with c/o hypotension, per practitioner at [facility] pt BP 92/50 sitting with near syncopal episodes with standing, pt dx with Covid on 12/9, practitioner states pt has had decreased PO intake at this time. Pt c/o generalized weakness at this time. VSS upon arrival to ED.

## 2019-05-08 NOTE — ED Provider Notes (Signed)
[facility] Emergency Department Provider Note       Time seen: ----------------------------------------- 9:36 AM on 05/08/2019 -----------------------------------------   I have reviewed the triage vital signs and the nursing notes.  HISTORY   Chief Complaint Hypotension and Weakness   HPI Kimberly Trevino is a 73 y.o. female with a history of asthma, coronary disease, hyperlipidemia, hypertension, tachycardia who presents to the ED for hypotension.  Patient presents from [facility] with low blood pressure and near syncope with standing.  She was diagnosed with Covid 2 weeks ago.  She has had decreased oral intake, complains of generalized weakness.  Past Medical History:  Diagnosis Date  . Asthma   . Coronary artery disease 2011   Cardiac cath in 2011 at [facility]: 50% mid LAD stenosis without evidence of obstructive disease. Normal ejection fraction. Nuclear stress test done in October of 2013 4 atypical chest pain was normal.  . Hyperlipidemia   . Hypertension   . PSVT (paroxysmal supraventricular tachycardia) ([place])   . Tachycardia 2010  . TIA (transient ischemic attack)     Patient Active Problem List   Diagnosis Date Noted  . Acute CVA (cerebrovascular accident) ([place]) 06/09/2016  . CVA (cerebral vascular accident) ([place]) 04/11/2016  . TIA (transient ischemic attack) 04/10/2016  . Leg edema 12/04/2012  . PSVT (paroxysmal supraventricular tachycardia) ([place])   . Coronary artery disease   . Hyperlipidemia   . Hypertension     Past Surgical History:  Procedure Laterality Date  . ABDOMINAL HYSTERECTOMY    . CARDIAC CATHETERIZATION  2011   50% to the mid LAD lesion  . PARTIAL HYSTERECTOMY  1999  . TEE WITHOUT CARDIOVERSION N/A 06/13/2016   Procedure: TRANSESOPHAGEAL ECHOCARDIOGRAM (TEE);  Surgeon: Wellington Hampshire, MD;  Location: [facility] ORS;  Service: Cardiovascular;  Laterality: N/A;  . TONSILLECTOMY      Allergies Doxycycline and Sulfa  antibiotics  Social History Social History   Tobacco Use  . Smoking status: Never Smoker  . Smokeless tobacco: Never Used  Substance Use Topics  . Alcohol use: No  . Drug use: No   Review of Systems Constitutional: Negative for fever. Cardiovascular: Negative for chest pain. Respiratory: Negative for shortness of breath. Gastrointestinal: Negative for abdominal pain, vomiting and diarrhea. Musculoskeletal: Negative for back pain. Skin: Negative for rash. Neurological: Positive for generalized weakness  All systems negative/normal/unremarkable except as stated in the HPI  ____________________________________________   PHYSICAL EXAM:  VITAL SIGNS: ED Triage Vitals  Enc Vitals Group     BP 05/08/19 0931 (!) 101/59     Pulse Rate 05/08/19 0931 81     Resp 05/08/19 0931 20     Temp 05/08/19 0931 98.8 F (37.1 C)     Temp Source 05/08/19 0931 Oral     SpO2 05/08/19 0931 93 %     Weight 05/08/19 0932 154 lb (69.9 kg)     Height 05/08/19 0932 5\' 2"  (1.575 m)     Head Circumference --      Peak Flow --      Pain Score 05/08/19 0931 0     Pain Loc --      Pain Edu? --      Excl. in [place]? --    Constitutional: Alert and oriented. Well appearing and in no distress. Eyes: Conjunctivae are normal. Normal extraocular movements. ENT      Head: Normocephalic and atraumatic.      Nose: No congestion/rhinnorhea.      Mouth/Throat: Mucous  membranes are moist.      Neck: No stridor. Cardiovascular: Normal rate, regular rhythm. No murmurs, rubs, or gallops. Respiratory: Normal respiratory effort without tachypnea nor retractions. Breath sounds are clear and equal bilaterally. No wheezes/rales/rhonchi. Gastrointestinal: Soft and nontender. Normal bowel sounds Musculoskeletal: Nontender with normal range of motion in extremities. No lower extremity tenderness nor edema. Neurologic:  Normal speech and language. No gross focal neurologic deficits are appreciated.  Skin:  Skin is warm,  dry and intact. No rash noted. Psychiatric: Mood and affect are normal. Speech and behavior are normal.  ____________________________________________  EKG: Interpreted by me.  Sinus rhythm with a rate of 85 bpm, left axis deviation, low voltage QRS, normal QT  ____________________________________________  ED COURSE:  As part of my medical decision making, I reviewed the following data within the [place] History obtained from family if available, nursing notes, old chart and ekg, as well as notes from prior ED visits. Patient presented for weakness, we will assess with labs and imaging as indicated at this time.   Procedures  Kimberly Trevino was evaluated in Emergency Department on 05/08/2019 for the symptoms described in the history of present illness. She was evaluated in the context of the global COVID-19 pandemic, which necessitated consideration that the patient might be at risk for infection with the SARS-CoV-2 virus that causes COVID-19. Institutional protocols and algorithms that pertain to the evaluation of patients at risk for COVID-19 are in a state of rapid change based on information released by regulatory bodies including the CDC and federal and state organizations. These policies and algorithms were followed during the patient's care in the ED.  ____________________________________________   LABS (pertinent positives/negatives)  Labs Reviewed  BASIC METABOLIC PANEL - Abnormal; Notable for the following components:      Result Value   Glucose, Bld 107 (*)    BUN 24 (*)    Creatinine, Ser 1.39 (*)    GFR calc non Af Amer 38 (*)    GFR calc Af Amer 44 (*)    All other components within normal limits  CBC  URINALYSIS, COMPLETE (UACMP) WITH MICROSCOPIC  CBG MONITORING, ED    RADIOLOGY Images were viewed by me  Chest x-ray  IMPRESSION:  No edema or consolidation. Cardiac silhouette within normal limits.   ____________________________________________   DIFFERENTIAL DIAGNOSIS   COVID-19, dehydration, electrolyte abnormality, renal failure, sepsis   FINAL ASSESSMENT AND PLAN  COVID-19, dehydration   Plan: The patient had presented for weakness. Patient's labs indicated increased BUN/creatinine likely indicative of poor p.o. intake because she has not been feeling well. Patient's imaging was reassuring.  She was given a liter of fluids here otherwise appears well enough to have outpatient follow-up.  Vital signs are stable.   Laurence Aly, MD    Note: This note was generated in part or whole with voice recognition software. Voice recognition is usually quite accurate but there are transcription errors that can and very often do occur. I apologize for any typographical errors that were not detected and corrected.     Earleen Newport, MD 05/08/19 1240

## 2019-06-19 ENCOUNTER — Other Ambulatory Visit: Payer: Self-pay | Admitting: Internal Medicine

## 2019-06-19 DIAGNOSIS — R2689 Other abnormalities of gait and mobility: Secondary | ICD-10-CM

## 2019-06-19 DIAGNOSIS — D649 Anemia, unspecified: Secondary | ICD-10-CM

## 2019-06-19 DIAGNOSIS — Z8616 Personal history of COVID-19: Secondary | ICD-10-CM

## 2019-06-19 DIAGNOSIS — I10 Essential (primary) hypertension: Secondary | ICD-10-CM

## 2019-07-03 ENCOUNTER — Ambulatory Visit
Admission: RE | Admit: 2019-07-03 | Discharge: 2019-07-03 | Disposition: A | Discharge status: Home / Self Care | Payer: Medicare Other | Source: Ambulatory Visit | Attending: Internal Medicine | Admitting: Internal Medicine

## 2019-07-03 ENCOUNTER — Other Ambulatory Visit: Payer: Self-pay

## 2019-07-03 DIAGNOSIS — I10 Essential (primary) hypertension: Secondary | ICD-10-CM

## 2019-07-03 DIAGNOSIS — Z8616 Personal history of COVID-19: Secondary | ICD-10-CM | POA: Diagnosis present

## 2019-07-03 DIAGNOSIS — R2689 Other abnormalities of gait and mobility: Secondary | ICD-10-CM

## 2019-07-03 DIAGNOSIS — D649 Anemia, unspecified: Secondary | ICD-10-CM | POA: Diagnosis present

## 2019-07-24 ENCOUNTER — Other Ambulatory Visit: Payer: Self-pay | Admitting: Urology

## 2019-08-09 ENCOUNTER — Other Ambulatory Visit: Payer: Self-pay | Admitting: Urology

## 2019-09-30 DIAGNOSIS — F3341 Major depressive disorder, recurrent, in partial remission: Secondary | ICD-10-CM | POA: Insufficient documentation

## 2019-11-18 ENCOUNTER — Other Ambulatory Visit: Payer: Self-pay

## 2019-11-18 ENCOUNTER — Emergency Department
Admission: EM | Admit: 2019-11-18 | Discharge: 2019-11-18 | Disposition: A | Discharge status: Home / Self Care | Payer: Medicare Other | Attending: Emergency Medicine | Admitting: Emergency Medicine

## 2019-11-18 ENCOUNTER — Encounter: Payer: Self-pay | Admitting: Emergency Medicine

## 2019-11-18 DIAGNOSIS — I2510 Atherosclerotic heart disease of native coronary artery without angina pectoris: Secondary | ICD-10-CM | POA: Insufficient documentation

## 2019-11-18 DIAGNOSIS — N390 Urinary tract infection, site not specified: Secondary | ICD-10-CM | POA: Diagnosis not present

## 2019-11-18 DIAGNOSIS — Z79899 Other long term (current) drug therapy: Secondary | ICD-10-CM | POA: Diagnosis not present

## 2019-11-18 DIAGNOSIS — J45909 Unspecified asthma, uncomplicated: Secondary | ICD-10-CM | POA: Insufficient documentation

## 2019-11-18 DIAGNOSIS — I10 Essential (primary) hypertension: Secondary | ICD-10-CM | POA: Diagnosis not present

## 2019-11-18 DIAGNOSIS — B9689 Other specified bacterial agents as the cause of diseases classified elsewhere: Secondary | ICD-10-CM | POA: Diagnosis not present

## 2019-11-18 DIAGNOSIS — R300 Dysuria: Secondary | ICD-10-CM | POA: Diagnosis present

## 2019-11-18 DIAGNOSIS — Z7982 Long term (current) use of aspirin: Secondary | ICD-10-CM | POA: Insufficient documentation

## 2019-11-18 DIAGNOSIS — Z8673 Personal history of transient ischemic attack (TIA), and cerebral infarction without residual deficits: Secondary | ICD-10-CM | POA: Insufficient documentation

## 2019-11-18 DIAGNOSIS — Z7951 Long term (current) use of inhaled steroids: Secondary | ICD-10-CM | POA: Insufficient documentation

## 2019-11-18 LAB — COMPREHENSIVE METABOLIC PANEL WITH GFR
ALT: 22 U/L (ref 0–44)
AST: 23 U/L (ref 15–41)
Albumin: 4.3 g/dL (ref 3.5–5.0)
Alkaline Phosphatase: 67 U/L (ref 38–126)
Anion gap: 12 (ref 5–15)
BUN: 36 mg/dL — ABNORMAL HIGH (ref 8–23)
CO2: 22 mmol/L (ref 22–32)
Calcium: 9.6 mg/dL (ref 8.9–10.3)
Chloride: 101 mmol/L (ref 98–111)
Creatinine, Ser: 1.26 mg/dL — ABNORMAL HIGH (ref 0.44–1.00)
GFR calc Af Amer: 49 mL/min — ABNORMAL LOW
GFR calc non Af Amer: 43 mL/min — ABNORMAL LOW
Glucose, Bld: 114 mg/dL — ABNORMAL HIGH (ref 70–99)
Potassium: 3.7 mmol/L (ref 3.5–5.1)
Sodium: 135 mmol/L (ref 135–145)
Total Bilirubin: 1.6 mg/dL — ABNORMAL HIGH (ref 0.3–1.2)
Total Protein: 7.8 g/dL (ref 6.5–8.1)

## 2019-11-18 LAB — URINALYSIS, COMPLETE (UACMP) WITH MICROSCOPIC
Bacteria, UA: NONE SEEN
Bilirubin Urine: NEGATIVE
Glucose, UA: NEGATIVE mg/dL
Ketones, ur: NEGATIVE mg/dL
Nitrite: POSITIVE — AB
Protein, ur: 100 mg/dL — AB
Specific Gravity, Urine: 1.01 (ref 1.005–1.030)
WBC, UA: >50 WBC/hpf — ABNORMAL HIGH (ref 0–5)
pH: 6 (ref 5.0–8.0)

## 2019-11-18 LAB — RENAL FUNCTION PANEL
Albumin: 3.7 g/dL (ref 3.5–5.0)
Anion gap: 11 (ref 5–15)
BUN: 31 mg/dL — ABNORMAL HIGH (ref 8–23)
CO2: 21 mmol/L — ABNORMAL LOW (ref 22–32)
Calcium: 8.7 mg/dL — ABNORMAL LOW (ref 8.9–10.3)
Chloride: 104 mmol/L (ref 98–111)
Creatinine, Ser: 1.15 mg/dL — ABNORMAL HIGH (ref 0.44–1.00)
GFR calc Af Amer: 55 mL/min — ABNORMAL LOW
GFR calc non Af Amer: 47 mL/min — ABNORMAL LOW
Glucose, Bld: 97 mg/dL (ref 70–99)
Phosphorus: 3 mg/dL (ref 2.5–4.6)
Potassium: 3.8 mmol/L (ref 3.5–5.1)
Sodium: 136 mmol/L (ref 135–145)

## 2019-11-18 LAB — CBC
HCT: 39 % (ref 36.0–46.0)
Hemoglobin: 13.3 g/dL (ref 12.0–15.0)
MCH: 28.2 pg (ref 26.0–34.0)
MCHC: 34.1 g/dL (ref 30.0–36.0)
MCV: 82.8 fL (ref 80.0–100.0)
Platelets: 223 K/uL (ref 150–400)
RBC: 4.71 MIL/uL (ref 3.87–5.11)
RDW: 13.6 % (ref 11.5–15.5)
WBC: 13.7 K/uL — ABNORMAL HIGH (ref 4.0–10.5)
nRBC: 0 % (ref 0.0–0.2)

## 2019-11-18 MED ORDER — CEPHALEXIN 500 MG PO CAPS: 500.0000 mg | ORAL_CAPSULE | Freq: Three times a day (TID) | ORAL | 0 refills | Status: DC

## 2019-11-18 MED ORDER — SODIUM CHLORIDE 0.9 % IV SOLN: Freq: Once | INTRAVENOUS | Status: AC

## 2019-11-18 MED ORDER — SODIUM CHLORIDE 0.9 % IV SOLN
1.0000 g | Freq: Once | INTRAVENOUS | Status: AC
  Administered 2019-11-18: 1 g via INTRAVENOUS
  Filled 2019-11-18: qty 10

## 2019-11-18 MED ORDER — ACETAMINOPHEN 325 MG PO TABS
650.0000 mg | ORAL_TABLET | Freq: Once | ORAL | Status: AC
  Administered 2019-11-18: 650 mg via ORAL
  Filled 2019-11-18: qty 2

## 2019-11-18 NOTE — Discharge Instructions (Addendum)
Call make an appointment with your primary care provider in 7 to 10 days for reevaluation and also possible lab work.  Begin taking Keflex 500 mg 3 times daily for 7 days.  The antibiotic that you got while in the emergency department should help get your urinary tract infection under control quicker.  You may take 1 tablet every 6 hours as needed.  Increase fluids.  Return to the emergency department if any worsening of your symptoms such as nausea, vomiting or inability to take your antibiotic.  Also if you spike a fever return to the emergency department.

## 2019-11-18 NOTE — ED Triage Notes (Signed)
Pt to ER reports lower back pain, pain with urination, urinary pressure. Pt states she contacted her PCP Thursday and had UA done at office, pt has results with her.  Pt states she called pharmacy and they have not received a Rx for her.

## 2019-11-18 NOTE — ED Provider Notes (Signed)
[facility] Emergency Department Provider Note   ____________________________________________   First MD Initiated Contact with Patient 11/18/19 1019     (approximate)  I have reviewed the triage vital signs and the nursing notes.   HISTORY  Chief Complaint Dysuria   HPI Kimberly Trevino is a 74 y.o. female presents to the ED with complaint of low back pain with urinary frequency and burning.  She also reports pressure and urgency.  She denies any known fever or chills.  She denies nausea or vomiting.  She states that occasionally she does get urinary tract infections.  She reports that she gave a urine specimen to [name] on 11/15/2019 and got the results but has not received any antibiotics.     Past Medical History:  Diagnosis Date   Asthma    Coronary artery disease 2011   Cardiac cath in 2011 at [facility]: 50% mid LAD stenosis without evidence of obstructive disease. Normal ejection fraction. Nuclear stress test done in October of 2013 4 atypical chest pain was normal.   Hyperlipidemia    Hypertension    PSVT (paroxysmal supraventricular tachycardia) ([place])    Tachycardia 2010   TIA (transient ischemic attack)     Patient Active Problem List   Diagnosis Date Noted   Acute CVA (cerebrovascular accident) ([place]) 06/09/2016   CVA (cerebral vascular accident) ([place]) 04/11/2016   TIA (transient ischemic attack) 04/10/2016   Leg edema 12/04/2012   PSVT (paroxysmal supraventricular tachycardia) ([place])    Coronary artery disease    Hyperlipidemia    Hypertension     Past Surgical History:  Procedure Laterality Date   ABDOMINAL HYSTERECTOMY     CARDIAC CATHETERIZATION  2011   50% to the mid LAD lesion   PARTIAL HYSTERECTOMY  1999   TEE WITHOUT CARDIOVERSION N/A 06/13/2016   Procedure: TRANSESOPHAGEAL ECHOCARDIOGRAM (TEE);  Surgeon: Wellington Hampshire, MD;  Location: [facility] ORS;  Service: Cardiovascular;  Laterality: N/A;    TONSILLECTOMY      Prior to Admission medications   Medication Sig Start Date End Date Taking? Authorizing Provider  acetaminophen (TYLENOL) 325 MG tablet Take 2 tablets (650 mg total) by mouth every 6 (six) hours as needed for mild pain (or Fever >/= 101). 06/13/16   [name], MD  aspirin EC 81 MG EC tablet Take 1 tablet (81 mg total) by mouth daily. 06/14/16   [name], [name], MD  CARTIA XT 240 MG 24 hr capsule Take 240 mg by mouth daily. 03/29/16   [provider]  cephALEXin (KEFLEX) 500 MG capsule Take 1 capsule (500 mg total) by mouth 3 (three) times daily. 11/18/19   [name], PA-C  Cholecalciferol (VITAMIN D PO) Take by mouth daily.    [provider]  clopidogrel (PLAVIX) 75 MG tablet Take 1 tablet (75 mg total) by mouth daily. 03/13/17   [name], [name], PA-C  estradiol (ESTRACE) 0.5 MG tablet Take 0.5 mg by mouth daily.    [provider]  fluocinonide cream (LIDEX) 2.40 % Apply 1 application topically as needed.  07/08/13   [provider]  lisinopril (PRINIVIL,ZESTRIL) 20 MG tablet Take 20 mg by mouth daily.    [provider]  loratadine (CLARITIN) 10 MG tablet Take 10 mg by mouth daily.    [provider]  metoprolol succinate (TOPROL-XL) 100 MG 24 hr tablet Take 100 mg by mouth daily. Take with or immediately following a meal.    [provider]  Misc Natural Products (GINKOGIN PO) Take by mouth daily.    [provider]  Multiple Vitamin (MULTIVITAMIN) tablet Take 1 tablet by mouth daily.    [provider]  mupirocin ointment (BACTROBAN) 2 % APPLY TOPICALLY THREE TIMES DAILY 04/06/16   [provider]  oxybutynin (DITROPAN) 5 MG tablet TAKE 1 TABLET BY MOUTH 3  TIMES DAILY 08/27/18   [name], [name], PA-C  rosuvastatin (CRESTOR) 20 MG tablet Take 1 tablet (20 mg total) by mouth daily at 6 PM. 06/13/16   [name], [name], MD  tolterodine (DETROL) 2 MG tablet Take 1 tablet (2 mg total) by  mouth 2 (two) times daily. 04/16/18   [name], [name], PA-C  Valerian Root 100 MG CAPS Take 100 mg by mouth at bedtime.    [provider]  VENTOLIN HFA 108 (90 BASE) MCG/ACT inhaler Inhale 2 puffs into the lungs every 4 (four) hours as needed.  06/08/13   [provider]    Allergies Doxycycline and Sulfa antibiotics  Family History  Problem Relation Age of Onset   Hypertension Mother    Heart attack Father 54       MI   Heart disease Father    Hypertension Father    Hypertension Sister    Hyperlipidemia Sister    Heart attack Brother        cardiac arrest   Hypertension Sister    Hyperlipidemia Sister    Breast cancer Neg Hx     Social History Social History   Tobacco Use   Smoking status: Never Smoker   Smokeless tobacco: Never Used  Substance Use Topics   Alcohol use: No   Drug use: No    Review of Systems Constitutional: No fever/chills Eyes: No visual changes. ENT: No sore throat. Cardiovascular: Denies chest pain. Respiratory: Denies shortness of breath. Gastrointestinal: No abdominal pain.  No nausea, no vomiting.  No diarrhea.  No constipation. Genitourinary: Positive for frequency, urgency and pressure. Musculoskeletal: Negative for back pain. Skin: Negative for rash. Neurological: Negative for headaches, focal weakness or numbness. ____________________________________________   PHYSICAL EXAM:  VITAL SIGNS: ED Triage Vitals [11/18/19 0827]  Enc Vitals Group     BP (!) 160/76     Pulse Rate (!) 109     Resp 18     Temp 99.3 F (37.4 C)     Temp Source Oral     SpO2 98 %     Weight 160 lb (72.6 kg)     Height 5\' 2"  (1.575 m)     Head Circumference      Peak Flow      Pain Score 10     Pain Loc      Pain Edu?      Excl. in [place]?    Constitutional: Alert and oriented. Well appearing and in no acute distress.  Nontoxic and ambulatory without any assistance. Eyes: Conjunctivae are normal.  Head:  Atraumatic. Nose: No congestion/rhinnorhea. Neck: No stridor.   Cardiovascular: Normal rate, regular rhythm. Grossly normal heart sounds.  Good peripheral circulation. Respiratory: Normal respiratory effort.  No retractions. Lungs CTAB. Gastrointestinal: Soft and nontender. No distention.  Bowel sounds normoactive x4 quadrants. No CVA tenderness. Musculoskeletal: Moves upper and lower extremities without any difficulty.  Patient is ambulatory without any assistance. Neurologic:  Normal speech and language. No gross focal neurologic deficits are appreciated.  Skin:  Skin is warm, dry and intact. No rash noted. Psychiatric: Mood and affect are normal. Speech and  behavior are normal.  ____________________________________________   LABS (all labs ordered are listed, but only abnormal results are displayed)  Labs Reviewed  COMPREHENSIVE METABOLIC PANEL - Abnormal; Notable for the following components:      Result Value   Glucose, Bld 114 (*)    BUN 36 (*)    Creatinine, Ser 1.26 (*)    Total Bilirubin 1.6 (*)    GFR calc non Af Amer 43 (*)    GFR calc Af Amer 49 (*)    All other components within normal limits  CBC - Abnormal; Notable for the following components:   WBC 13.7 (*)    All other components within normal limits  URINALYSIS, COMPLETE (UACMP) WITH MICROSCOPIC - Abnormal; Notable for the following components:   Color, Urine AMBER (*)    APPearance TURBID (*)    Hgb urine dipstick MODERATE (*)    Protein, ur 100 (*)    Nitrite POSITIVE (*)    Leukocytes,Ua LARGE (*)    WBC, UA >50 (*)    All other components within normal limits  RENAL FUNCTION PANEL - Abnormal; Notable for the following components:   CO2 21 (*)    BUN 31 (*)    Creatinine, Ser 1.15 (*)    Calcium 8.7 (*)    GFR calc non Af Amer 47 (*)    GFR calc Af Amer 55 (*)    All other components within normal limits  URINE CULTURE    PROCEDURES  Procedure(s) performed (including Critical  Care):  Procedures   ____________________________________________   INITIAL IMPRESSION / ASSESSMENT AND PLAN / ED COURSE  As part of my medical decision making, I reviewed the following data within the electronic MEDICAL RECORD NUMBER Notes from prior ED visits and [place] Controlled Substance Database  74 year old female presents to the ED with complaint of urinary frequency, urgency and dysuria.  Patient complains of suprapubic pressure.  She denies any fever or chills at home.  She began having symptoms prior to 4 July and received the results of her urinalysis from her PCP but did not have any antibiotics sent to the pharmacy.  She denies any nausea or vomiting.  Urinalysis is consistent with an acute UTI.  Initial BUN and creatinine were slightly elevated at 36 and 1.26 respectively.  Patient was given fluids and Rocephin 1 IV.  Patient continued to be ambulatory to the bathroom and was feeling better.  A repeat renal function panel showed improvement and patient would like to go home.  She agrees to follow-up with her PCP in 7 to 10 days for repeat of her test.  She is aware if she begins feeling worse she is to return to the emergency department immediately.  She is encouraged to increase fluids and a prescription for Keflex 500 mg 3 times daily was sent to her pharmacy.  A culture was ordered on her urine today.  ____________________________________________   FINAL CLINICAL IMPRESSION(S) / ED DIAGNOSES  Final diagnoses:  Acute urinary tract infection     ED Discharge Orders         Ordered    cephALEXin (KEFLEX) 500 MG capsule  3 times daily     Discontinue  Reprint     11/18/19 1446           Note:  This document was prepared using Dragon voice recognition software and may include unintentional dictation errors.    [name], PA-C 11/18/19 1730    Carrie Mew, MD 11/19/19  1459 ° °

## 2019-11-21 LAB — URINE CULTURE
Culture: >=100000 — AB
Special Requests: NORMAL

## 2019-12-04 ENCOUNTER — Other Ambulatory Visit: Payer: Self-pay | Admitting: Internal Medicine

## 2019-12-04 DIAGNOSIS — Z1231 Encounter for screening mammogram for malignant neoplasm of breast: Secondary | ICD-10-CM

## 2019-12-19 ENCOUNTER — Other Ambulatory Visit: Payer: Self-pay

## 2019-12-19 ENCOUNTER — Ambulatory Visit
Admission: RE | Admit: 2019-12-19 | Discharge: 2019-12-19 | Disposition: A | Discharge status: Home / Self Care | Payer: Medicare Other | Source: Ambulatory Visit | Attending: Internal Medicine | Admitting: Internal Medicine

## 2019-12-19 DIAGNOSIS — Z1231 Encounter for screening mammogram for malignant neoplasm of breast: Secondary | ICD-10-CM | POA: Diagnosis not present

## 2020-04-27 ENCOUNTER — Other Ambulatory Visit: Payer: Self-pay | Admitting: Urology

## 2020-05-08 ENCOUNTER — Other Ambulatory Visit: Payer: Self-pay | Admitting: Urology

## 2020-12-08 ENCOUNTER — Other Ambulatory Visit: Payer: Self-pay | Admitting: Internal Medicine

## 2020-12-08 DIAGNOSIS — Z1231 Encounter for screening mammogram for malignant neoplasm of breast: Secondary | ICD-10-CM

## 2020-12-15 NOTE — Progress Notes (Signed)
12/16/2020 4:05 PM   Kimberly Trevino [birth year] [id number]  Referring provider: Tracie Harrier, [place] [facility] [place],  [place] [address]  Urological history: 1. Mixed incontinence -contributing factors of obesity, vaginal delivery, a family history of incontinence, age, stroke, depression, fecal incontinence, vaginal atrophy, oral estrogens and pelvic surgery, taking antihistamines, OAB medication, ACE inhibitors and/or oral estrogen.   -cysto 2019 NED -failed anticholinergic therapy -failed PTNS -Myrbetriq 25 mg daily cost prohibitive -PVR 14 mL  2.rUTI's -contributing factors of age, vaginal atrophy, poor perineal hygiene,  -documented positive urine cultures over the last year  02/03/2020 enterococcus faecalis  05/26/2020 e.coli  11/14/2020 e.coli  3. Pelvic floor laxity -Small high grade 2 cystocele and a distal grade 1 rectocele   Chief Complaint  Patient presents with   Urinary Incontinence   Recurrent UTI    HPI: Kimberly Trevino is a 75 y.o. female who presents today for OAB and rUTI's  Patient states that she has had urinary incontinence for years.     She says the urine just comes out.  She also leans forward to empty her bladder.  She is having 1-7 daytime voids, nocturia x3 or more, strong urge to urinate, she loses urine when she feels the urge to urinate but cannot make it to the bathroom.  She is having 3 or more daily incontinent episodes and is wearing 6 depends daily.  She is limiting her fluid intake in an effort to avoid excessive urination and she does engage in toilet mapping.    She is post menopausal.   She admits to constipation and/or diarrhea.   She is drinking 3 cups of water daily.   She is drinking one caffeinated beverages daily.  She is drinking zero alcoholic beverages daily.       PMH: Past Medical History:  Diagnosis Date   Asthma    Coronary artery disease 2011   Cardiac cath in 2011 at  [facility]: 50% mid LAD stenosis without evidence of obstructive disease. Normal ejection fraction. Nuclear stress test done in October of 2013 4 atypical chest pain was normal.   Hyperlipidemia    Hypertension    PSVT (paroxysmal supraventricular tachycardia) ([place])    Tachycardia 2010   TIA (transient ischemic attack)     Surgical History: Past Surgical History:  Procedure Laterality Date   ABDOMINAL HYSTERECTOMY     CARDIAC CATHETERIZATION  2011   50% to the mid LAD lesion   PARTIAL HYSTERECTOMY  1999   TEE WITHOUT CARDIOVERSION N/A 06/13/2016   Procedure: TRANSESOPHAGEAL ECHOCARDIOGRAM (TEE);  Surgeon: Wellington Hampshire, MD;  Location: [facility] ORS;  Service: Cardiovascular;  Laterality: N/A;   TONSILLECTOMY      Home Medications:  Allergies as of 12/16/2020       Reactions   Doxycycline Itching   Other reaction(s): Angioedema   Sulfa Antibiotics         Medication List        Accurate as of December 16, 2020  4:05 PM. If you have any questions, ask your nurse or doctor.          STOP taking these medications    oxybutynin 5 MG tablet Commonly known as: DITROPAN Stopped by: Abdulrahim Siddiqi, PA-C   tolterodine 2 MG tablet Commonly known as: Detrol Stopped by: Zara Council, PA-C       TAKE these medications    acetaminophen 325 MG tablet Commonly known as: TYLENOL Take 2 tablets (650  mg total) by mouth every 6 (six) hours as needed for mild pain (or Fever >/= 101).   aspirin 81 MG EC tablet Take 1 tablet (81 mg total) by mouth daily.   Cartia XT 240 MG 24 hr capsule Generic drug: diltiazem Take 240 mg by mouth daily.   cephALEXin 500 MG capsule Commonly known as: KEFLEX Take 1 capsule (500 mg total) by mouth 3 (three) times daily.   clopidogrel 75 MG tablet Commonly known as: PLAVIX Take 1 tablet (75 mg total) by mouth daily.   estradiol 0.5 MG tablet Commonly known as: ESTRACE Take 0.5 mg by mouth daily.   Euthyrox 25 MCG tablet Generic drug:  levothyroxine Take 25 mcg by mouth every morning.   fluocinonide cream 0.05 % Commonly known as: LIDEX Apply 1 application topically as needed.   Gemtesa 75 MG Tabs Generic drug: Vibegron Take 75 mg by mouth daily. Started by: Zara Council, PA-C   GINKOGIN PO Take by mouth daily.   isosorbide mononitrate 30 MG 24 hr tablet Commonly known as: IMDUR Take 30 mg by mouth daily.   lisinopril 20 MG tablet Commonly known as: ZESTRIL Take 20 mg by mouth daily.   loratadine 10 MG tablet Commonly known as: CLARITIN Take 10 mg by mouth daily.   metoprolol succinate 100 MG 24 hr tablet Commonly known as: TOPROL-XL Take 100 mg by mouth daily. Take with or immediately following a meal.   multivitamin tablet Take 1 tablet by mouth daily.   mupirocin ointment 2 % Commonly known as: BACTROBAN APPLY TOPICALLY THREE TIMES DAILY   rosuvastatin 20 MG tablet Commonly known as: CRESTOR Take 1 tablet (20 mg total) by mouth daily at 6 PM.   Valerian Root 100 MG Caps Take 100 mg by mouth at bedtime.   Ventolin HFA 108 (90 Base) MCG/ACT inhaler Generic drug: albuterol Inhale 2 puffs into the lungs every 4 (four) hours as needed.   VITAMIN D PO Take by mouth daily.        Allergies:  Allergies  Allergen Reactions   Doxycycline Itching    Other reaction(s): Angioedema   Sulfa Antibiotics     Family History: Family History  Problem Relation Age of Onset   Hypertension Mother    Heart attack Father 79       MI   Heart disease Father    Hypertension Father    Hypertension Sister    Hyperlipidemia Sister    Heart attack Brother        cardiac arrest   Hypertension Sister    Hyperlipidemia Sister    Breast cancer Neg Hx     Social History:  reports that she has never smoked. She has never used smokeless tobacco. She reports that she does not drink alcohol and does not use drugs.  ROS: Pertinent ROS in HPI  Physical Exam: BP (!) 148/75   Pulse 85   Ht _0   (1.575 m)   Wt 158 lb (71.7 kg)   BMI 28.90 kg/m   Constitutional:  Well nourished. Alert and oriented, No acute distress. HEENT:  AT, mask in place.  Trachea midline Cardiovascular: No clubbing, cyanosis, or edema. Respiratory: Normal respiratory effort, no increased work of breathing. GU: No CVA tenderness.  No bladder fullness or masses.  Atrophic external genitalia, sparse pubic hair distribution, no lesions.  Normal urethral meatus, no lesions, no prolapse, no discharge.   No urethral masses, tenderness and/or tenderness. No bladder fullness, tenderness or masses.  Pale  vagina mucosa, fair estrogen effect, no discharge, no lesions, poor pelvic support, grade 2 cystocele and grade 1 rectocele noted.   Anus and perineum are without rashes or lesions.     Neurologic: Grossly intact, no focal deficits, moving all 4 extremities. Psychiatric: Normal mood and affect.    Laboratory Data: WBC (White Blood Cell Count) 4.1 - 10.2 10^3/uL 5.1   RBC (Red Blood Cell Count) 4.04 - 5.48 10^6/uL 4.75   Hemoglobin 12.0 - 15.0 gm/dL 13.5   Hematocrit 35.0 - 47.0 % 39.8   MCV (Mean Corpuscular Volume) 80.0 - 100.0 fl 83.8   MCH (Mean Corpuscular Hemoglobin) 27.0 - 31.2 pg 28.4   MCHC (Mean Corpuscular Hemoglobin Concentration) 32.0 - 36.0 gm/dL 33.9   Platelet Count 150 - 450 10^3/uL 205   RDW-CV (Red Cell Distribution Width) 11.6 - 14.8 % 13.3   MPV (Mean Platelet Volume) 9.4 - 12.4 fl 9.2 Low    Neutrophils 1.50 - 7.80 10^3/uL 2.88   Lymphocytes 1.00 - 3.60 10^3/uL 1.54   Monocytes 0.00 - 1.50 10^3/uL 0.56   Eosinophils 0.00 - 0.55 10^3/uL 0.09   Basophils 0.00 - 0.09 10^3/uL 0.02   Neutrophil % 32.0 - 70.0 % 56.4   Lymphocyte % 10.0 - 50.0 % 30.2   Monocyte % 4.0 - 13.0 % 11.0   Eosinophil % 1.0 - 5.0 % 1.8   Basophil% 0.0 - 2.0 % 0.4   Immature Granulocyte % <=0.7 % 0.2   Immature Granulocyte Count <=0.06 10^3/L 0.01   Resulting Agency  [facility] - LAB  Specimen Collected:  10/16/20 14:50 Last Resulted: 10/16/20 15:12  Received From: [facility]  Result Received: 11/17/20 10:11   Hemoglobin A1C 4.2 - 5.6 % 6.1 High    Average Blood Glucose (Calc) mg/dL 128   Resulting Agency  [facility] - LAB   Narrative Performed by [facility] - LAB Normal Range:    4.2 - 5.6%  Increased Risk:  5.7 - 6.4%  Diabetes:        >= 6.5%  Glycemic Control for adults with diabetes:  <7%   Specimen Collected: 10/16/20 14:50 Last Resulted: 10/16/20 17:00  Received From: [facility]  Result Received: 11/17/20 10:11   Glucose 70 - 110 mg/dL 82   Sodium 136 - 145 mmol/L 137   Potassium 3.6 - 5.1 mmol/L 3.7   Chloride 97 - 109 mmol/L 103   Carbon Dioxide (CO2) 22.0 - 32.0 mmol/L 27.2   Urea Nitrogen (BUN) 7 - 25 mg/dL 24   Creatinine 0.6 - 1.1 mg/dL 1.0   Glomerular Filtration Rate (eGFR), MDRD Estimate >60 mL/min/1.73sq m 66   Calcium 8.7 - 10.3 mg/dL 9.3   AST  8 - 39 U/L 25   ALT  5 - 38 U/L 24   Alk Phos (alkaline Phosphatase) 34 - 104 U/L 60   Albumin 3.5 - 4.8 g/dL 4.2   Bilirubin, Total 0.3 - 1.2 mg/dL 0.8   Protein, Total 6.1 - 7.9 g/dL 6.6   A/G Ratio 1.0 - 5.0 gm/dL 1.8   Resulting Agency  [place] - LAB  Specimen Collected: 10/16/20 14:50 Last Resulted: 10/16/20 16:37  Received From: [facility]  Result Received: 11/17/20 10:11   Urinalysis Component     Latest Ref Rng & Units 12/16/2020  Specific Gravity, UA     1.005 - 1.030 1.020  pH, UA     5.0 - 7.5 6.0  Color, UA     Yellow Yellow  Appearance Ur     Clear Clear  Leukocytes,UA     Negative Negative  Protein,UA     Negative/Trace Negative  Glucose, UA     Negative Negative  Ketones, UA     Negative Trace (A)  RBC, UA     Negative Negative  Bilirubin, UA     Negative Negative  Urobilinogen, Ur     0.2 - 1.0 mg/dL 2.0 (H)  Nitrite, UA     Negative Negative  Microscopic Examination      See below:    Component     Latest Ref Rng & Units 12/16/2020  WBC, UA     0 - 5 /hpf 0-5  RBC     0 - 2 /hpf 0-2  Epithelial Cells (non renal)     0 - 10 /hpf 0-10  Casts     None seen /lpf Present (A)  Cast Type     N/A Hyaline casts  Bacteria, UA     None seen/Few None seen  I have reviewed the labs.   Pertinent Imaging: Results for ADRIANNA, DUDAS (MRN [id number]) as of 12/16/2020 16:00  Ref. Range 12/16/2020 15:31  Scan Result Unknown 49m       Assessment & Plan:    1. Mixed incontinence -I offered her a trial Gemtesa 75 mg, # 28, one daily, samples given, referral to GYN for pessary fitting, referral to PT and/or a return appointment with Dr. MMatilde Sprang-She declined referral for pessary, physical therapy and with Dr. MMatilde Sprangas she does not want to go forward with the treatments that are time-consuming or require surgery -We will give her samples of Gemtesa although it is likely that her insurance will not cover the medication and it will be cost prohibitive for her -She will return in 1 month for OAB questionnaire and PVR  2. rUTI's -UA clear -Asymptomatic at this visit  3. Pelvic floor laxity -Deferred referral for pessary fitting, PT and appointment with Dr. MMatilde Sprang Return in about 3 weeks (around 01/06/2021) for PVR and OAB questionnaire.  These notes generated with voice recognition software. I apologize for typographical errors.  SZara Council PA-C  [facility] [address] [place] [phone]

## 2020-12-16 ENCOUNTER — Other Ambulatory Visit: Payer: Self-pay

## 2020-12-16 ENCOUNTER — Encounter: Payer: Self-pay | Admitting: Urology

## 2020-12-16 ENCOUNTER — Ambulatory Visit: Payer: Medicare Other | Admitting: Urology

## 2020-12-16 VITALS — BP 148/75 | HR 85 | Ht 62.0 in | Wt 158.0 lb

## 2020-12-16 DIAGNOSIS — N8189 Other female genital prolapse: Secondary | ICD-10-CM

## 2020-12-16 DIAGNOSIS — N3946 Mixed incontinence: Secondary | ICD-10-CM | POA: Diagnosis not present

## 2020-12-16 DIAGNOSIS — N390 Urinary tract infection, site not specified: Secondary | ICD-10-CM

## 2020-12-16 LAB — MICROSCOPIC EXAMINATION: Bacteria, UA: NONE SEEN

## 2020-12-16 LAB — URINALYSIS, COMPLETE
Bilirubin, UA: NEGATIVE
Glucose, UA: NEGATIVE
Leukocytes,UA: NEGATIVE
Nitrite, UA: NEGATIVE
Protein,UA: NEGATIVE
RBC, UA: NEGATIVE
Specific Gravity, UA: 1.02 (ref 1.005–1.030)
Urobilinogen, Ur: 2 mg/dL — ABNORMAL HIGH (ref 0.2–1.0)
pH, UA: 6 (ref 5.0–7.5)

## 2020-12-16 LAB — BLADDER SCAN AMB NON-IMAGING

## 2020-12-16 MED ORDER — GEMTESA 75 MG PO TABS: 75.0000 mg | ORAL_TABLET | Freq: Every day | ORAL | 0 refills | Status: DC

## 2020-12-21 ENCOUNTER — Ambulatory Visit
Admission: RE | Admit: 2020-12-21 | Discharge: 2020-12-21 | Disposition: A | Discharge status: Home / Self Care | Payer: Medicare Other | Source: Ambulatory Visit | Attending: Internal Medicine | Admitting: Internal Medicine

## 2020-12-21 ENCOUNTER — Other Ambulatory Visit: Payer: Self-pay

## 2020-12-21 DIAGNOSIS — Z1231 Encounter for screening mammogram for malignant neoplasm of breast: Secondary | ICD-10-CM | POA: Diagnosis not present

## 2021-01-12 ENCOUNTER — Ambulatory Visit: Payer: Self-pay | Admitting: Urology

## 2021-01-26 NOTE — Progress Notes (Signed)
01/27/2021 4:06 PM   Kimberly Trevino June 05, 1945 [id number]  Referring provider: Tracie Harrier, [place] [facility] [place],  [place] [address]  Urological history: 1. Mixed incontinence -contributing factors of obesity, vaginal delivery, a family history of incontinence, age, stroke, depression, fecal incontinence, vaginal atrophy, oral estrogens and pelvic surgery, taking antihistamines, OAB medication, ACE inhibitors and/or oral estrogen.   -cysto 2019 NED -failed anticholinergic therapy -failed PTNS -Myrbetriq 25 mg daily cost prohibitive -PVR 12 mL  2.rUTI's -contributing factors of age, vaginal atrophy, poor perineal hygiene,  -documented positive urine cultures over the last year  02/03/2020 enterococcus faecalis  05/26/2020 e.coli  11/14/2020 e.coli  3. Pelvic floor laxity -Small high grade 2 cystocele and a distal grade 1 rectocele   Chief Complaint  Patient presents with   Over Active Bladder   HPI: Kimberly Trevino is a 75 y.o. female who presents today for 3 week follow up for OAB questionnaire and PVR.   She is experiencing 8 or more daytime urinations, 3 or more episodes of nocturia and her urge to urinate is severe.  She is suffering from urge incontinence and having 3 or more daytime urinary leakage.  She is going through 6 absorbent underwear's which are substantiated with 4 absorbent pads daily.  She engages in toilet mapping and she does restrict fluids in an effort to stave off urination.  PVR 12 mL    PMH: Past Medical History:  Diagnosis Date   Asthma    Coronary artery disease 2011   Cardiac cath in 2011 at [facility]: 50% mid LAD stenosis without evidence of obstructive disease. Normal ejection fraction. Nuclear stress test done in October of 2013 4 atypical chest pain was normal.   Hyperlipidemia    Hypertension    PSVT (paroxysmal supraventricular tachycardia) ([place])    Tachycardia 2010   TIA (transient ischemic  attack)     Surgical History: Past Surgical History:  Procedure Laterality Date   ABDOMINAL HYSTERECTOMY     CARDIAC CATHETERIZATION  2011   50% to the mid LAD lesion   PARTIAL HYSTERECTOMY  1999   TEE WITHOUT CARDIOVERSION N/A 06/13/2016   Procedure: TRANSESOPHAGEAL ECHOCARDIOGRAM (TEE);  Surgeon: Wellington Hampshire, MD;  Location: [facility] ORS;  Service: Cardiovascular;  Laterality: N/A;   TONSILLECTOMY      Home Medications:  Allergies as of 01/27/2021       Reactions   Doxycycline Itching   Other reaction(s): Angioedema   Sulfa Antibiotics         Medication List        Accurate as of January 27, 2021  4:06 PM. If you have any questions, ask your nurse or doctor.          STOP taking these medications    Gemtesa 75 MG Tabs Generic drug: Vibegron Stopped by: Zara Council, PA-C       TAKE these medications    acetaminophen 325 MG tablet Commonly known as: TYLENOL Take 2 tablets (650 mg total) by mouth every 6 (six) hours as needed for mild pain (or Fever >/= 101).   aspirin 81 MG EC tablet Take 1 tablet (81 mg total) by mouth daily.   Cartia XT 240 MG 24 hr capsule Generic drug: diltiazem Take 240 mg by mouth daily.   cephALEXin 500 MG capsule Commonly known as: KEFLEX Take 1 capsule (500 mg total) by mouth 3 (three) times daily.   clopidogrel 75 MG tablet Commonly known as: PLAVIX  Take 1 tablet (75 mg total) by mouth daily.   estradiol 0.5 MG tablet Commonly known as: ESTRACE Take 0.5 mg by mouth daily.   Euthyrox 25 MCG tablet Generic drug: levothyroxine Take 25 mcg by mouth every morning.   fluocinonide cream 0.05 % Commonly known as: LIDEX Apply 1 application topically as needed.   GINKOGIN PO Take by mouth daily.   isosorbide mononitrate 30 MG 24 hr tablet Commonly known as: IMDUR Take 30 mg by mouth daily.   lisinopril 20 MG tablet Commonly known as: ZESTRIL Take 20 mg by mouth daily.   loratadine 10 MG tablet Commonly  known as: CLARITIN Take 10 mg by mouth daily.   metoprolol succinate 100 MG 24 hr tablet Commonly known as: TOPROL-XL Take 100 mg by mouth daily. Take with or immediately following a meal.   multivitamin tablet Take 1 tablet by mouth daily.   mupirocin ointment 2 % Commonly known as: BACTROBAN APPLY TOPICALLY THREE TIMES DAILY   oxybutynin 15 MG 24 hr tablet Commonly known as: DITROPAN XL Take 1 tablet (15 mg total) by mouth daily. Started by: Zara Council, PA-C   rosuvastatin 20 MG tablet Commonly known as: CRESTOR Take 1 tablet (20 mg total) by mouth daily at 6 PM.   sertraline 50 MG tablet Commonly known as: ZOLOFT Take 50 mg by mouth daily.   Valerian Root 100 MG Caps Take 100 mg by mouth at bedtime.   Ventolin HFA 108 (90 Base) MCG/ACT inhaler Generic drug: albuterol Inhale 2 puffs into the lungs every 4 (four) hours as needed.   VITAMIN D PO Take by mouth daily.        Allergies:  Allergies  Allergen Reactions   Doxycycline Itching    Other reaction(s): Angioedema   Sulfa Antibiotics     Family History: Family History  Problem Relation Age of Onset   Hypertension Mother    Heart attack Father 77       MI   Heart disease Father    Hypertension Father    Hypertension Sister    Hyperlipidemia Sister    Heart attack Brother        cardiac arrest   Hypertension Sister    Hyperlipidemia Sister    Breast cancer Neg Hx     Social History:  reports that she has never smoked. She has never used smokeless tobacco. She reports that she does not drink alcohol and does not use drugs.  ROS: Pertinent ROS in HPI  Physical Exam: BP (!) 150/71   Pulse 61   Ht '5\' 2"'$  (1.575 m)   Wt 161 lb (73 kg)   BMI 29.45 kg/m   Constitutional:  Well nourished. Alert and oriented, No acute distress. HEENT: [place] AT, mask in place.  Trachea midline Cardiovascular: No clubbing, cyanosis, or edema. Respiratory: Normal respiratory effort, no increased work of  breathing. Neurologic: Grossly intact, no focal deficits, moving all 4 extremities. Psychiatric: Normal mood and affect.    Laboratory Data: N/A  Pertinent Imaging: Results for Kimberly, Trevino (MRN [id number]) as of 01/27/2021 15:53  Ref. Range 01/27/2021 15:08  Scan Result Unknown 46m        Assessment & Plan:    1. Mixed incontinence -She did not find any improvement in her urinary symptoms on the Gemtesa, so she is went back to the oxybutynin IR 5 mg 3 times daily -She is very discouraged and states she cannot afford any more office visits or testing as she has  limited income -I have sent a prescription in for oxybutynin XL 15 mg daily to see if a higher dose of the oxybutynin will give her more control over her life regarding her urinary symptoms -She will call me in regards to its effectiveness and I may need to increase that dose as well  2. rUTI's -UA clear -Asymptomatic at this visit  3. Pelvic floor laxity -Deferred referral for pessary fitting, PT and appointment with Dr. Matilde Sprang  Return for Patient to call for next appointment .  These notes generated with voice recognition software. I apologize for typographical errors.  Zara Council, PA-C  [facility] [address]  [place], [place] [address] [phone]

## 2021-01-27 ENCOUNTER — Ambulatory Visit: Payer: Medicare Other | Admitting: Urology

## 2021-01-27 ENCOUNTER — Encounter: Payer: Self-pay | Admitting: Urology

## 2021-01-27 ENCOUNTER — Other Ambulatory Visit: Payer: Self-pay

## 2021-01-27 VITALS — BP 150/71 | HR 61 | Ht 62.0 in | Wt 161.0 lb

## 2021-01-27 DIAGNOSIS — N3946 Mixed incontinence: Secondary | ICD-10-CM | POA: Diagnosis not present

## 2021-01-27 LAB — BLADDER SCAN AMB NON-IMAGING

## 2021-01-27 MED ORDER — OXYBUTYNIN CHLORIDE ER 15 MG PO TB24: 15.0000 mg | ORAL_TABLET | Freq: Every day | ORAL | 3 refills | Status: DC

## 2021-05-20 ENCOUNTER — Encounter: Payer: Self-pay | Admitting: *Deleted

## 2021-05-20 NOTE — Anesthesia Preprocedure Evaluation (Addendum)
Anesthesia Evaluation  Patient identified by MRN, date of birth, ID band Patient awake    Reviewed: Allergy & Precautions, Patient's Chart, lab work & pertinent test results  Airway Mallampati: II  TM Distance: >3 FB Neck ROM: Full    Dental no notable dental hx.    Pulmonary asthma ([place]) ,    Pulmonary exam normal breath sounds clear to auscultation       Cardiovascular hypertension, + CAD  Normal cardiovascular exam Rhythm:Regular Rate:Normal  EKG  2021  SR  LAFB  ECHO  2018 Study Conclusions   - Left ventricle: Systolic function was normal. The estimated  ejection fraction was in the range of 55% to 60%. Wall motion was  normal; there were no regional wall motion abnormalities.  - Left atrium: No evidence of thrombus in the atrial cavity or  appendage.  - Right atrium: No evidence of thrombus in the atrial cavity or  appendage.  - Atrial septum: No defect or patent foramen ovale was identified.  Echo contrast study showed no right-to-left atrial level shunt,  following an increase in RA pressure induced by provocative  maneuvers.    Neuro/Psych Anxiety Depression PLAVIX  TIA   GI/Hepatic GERD  ,  Endo/Other    Renal/GU      Musculoskeletal  (+) Arthritis ,   Abdominal   Peds  Hematology  (+) Blood dyscrasia (PLAVIX  NONE X 1 WEEK), ,   Anesthesia Other Findings   Reproductive/Obstetrics                            Anesthesia Physical Anesthesia Plan  ASA: 3  Anesthesia Plan: General   Post-op Pain Management:    Induction: Intravenous  PONV Risk Score and Plan:   Airway Management Planned: Natural Airway and Nasal Cannula  Additional Equipment:   Intra-op Plan:   Post-operative Plan:   Informed Consent: I have reviewed the patients History and Physical, chart, labs and discussed the procedure including the risks, benefits and alternatives for  the proposed anesthesia with the patient or authorized representative who has indicated his/her understanding and acceptance.     Dental Advisory Given  Plan Discussed with: Anesthesiologist, CRNA and Surgeon  Anesthesia Plan Comments: (Patient consented for risks of anesthesia including but not limited to:  - adverse reactions to medications - risk of airway placement if required - damage to eyes, teeth, lips or other oral mucosa - nerve damage due to positioning  - sore throat or hoarseness - Damage to heart, brain, nerves, lungs, other parts of body or loss of life  Patient voiced understanding.)        Anesthesia Quick Evaluation

## 2021-05-21 ENCOUNTER — Ambulatory Visit
Admission: RE | Admit: 2021-05-21 | Discharge: 2021-05-21 | Disposition: A | Discharge status: Home / Self Care | Payer: Medicare Other | Attending: Gastroenterology | Admitting: Gastroenterology

## 2021-05-21 ENCOUNTER — Encounter: Payer: Self-pay | Admitting: *Deleted

## 2021-05-21 ENCOUNTER — Encounter
Admission: RE | Disposition: A | Discharge status: Home / Self Care | Payer: Self-pay | Source: Home / Self Care | Attending: Gastroenterology

## 2021-05-21 ENCOUNTER — Ambulatory Visit: Payer: Medicare Other | Admitting: Anesthesiology

## 2021-05-21 DIAGNOSIS — I2510 Atherosclerotic heart disease of native coronary artery without angina pectoris: Secondary | ICD-10-CM | POA: Diagnosis not present

## 2021-05-21 DIAGNOSIS — I10 Essential (primary) hypertension: Secondary | ICD-10-CM | POA: Diagnosis not present

## 2021-05-21 DIAGNOSIS — D759 Disease of blood and blood-forming organs, unspecified: Secondary | ICD-10-CM | POA: Insufficient documentation

## 2021-05-21 DIAGNOSIS — D122 Benign neoplasm of ascending colon: Secondary | ICD-10-CM | POA: Diagnosis not present

## 2021-05-21 DIAGNOSIS — J45909 Unspecified asthma, uncomplicated: Secondary | ICD-10-CM | POA: Insufficient documentation

## 2021-05-21 DIAGNOSIS — F32A Depression, unspecified: Secondary | ICD-10-CM | POA: Insufficient documentation

## 2021-05-21 DIAGNOSIS — K640 First degree hemorrhoids: Secondary | ICD-10-CM | POA: Insufficient documentation

## 2021-05-21 DIAGNOSIS — Z1211 Encounter for screening for malignant neoplasm of colon: Secondary | ICD-10-CM | POA: Diagnosis not present

## 2021-05-21 DIAGNOSIS — F419 Anxiety disorder, unspecified: Secondary | ICD-10-CM | POA: Diagnosis not present

## 2021-05-21 DIAGNOSIS — M1990 Unspecified osteoarthritis, unspecified site: Secondary | ICD-10-CM | POA: Diagnosis not present

## 2021-05-21 DIAGNOSIS — K219 Gastro-esophageal reflux disease without esophagitis: Secondary | ICD-10-CM | POA: Insufficient documentation

## 2021-05-21 DIAGNOSIS — Z7902 Long term (current) use of antithrombotics/antiplatelets: Secondary | ICD-10-CM | POA: Diagnosis not present

## 2021-05-21 DIAGNOSIS — K5730 Diverticulosis of large intestine without perforation or abscess without bleeding: Secondary | ICD-10-CM | POA: Diagnosis not present

## 2021-05-21 HISTORY — DX: Obesity, unspecified: E66.9

## 2021-05-21 HISTORY — DX: Vitamin D deficiency, unspecified: E55.9

## 2021-05-21 HISTORY — DX: Depression, unspecified: F32.A

## 2021-05-21 HISTORY — DX: Anxiety disorder, unspecified: F41.9

## 2021-05-21 HISTORY — DX: Cerebral infarction, unspecified: I63.9

## 2021-05-21 HISTORY — PX: COLONOSCOPY WITH PROPOFOL: SHX5780

## 2021-05-21 HISTORY — DX: Unspecified osteoarthritis, unspecified site: M19.90

## 2021-05-21 HISTORY — DX: Low back pain, unspecified: M54.50

## 2021-05-21 HISTORY — DX: Other chronic pain: G89.29

## 2021-05-21 HISTORY — DX: Deficiency of other specified B group vitamins: E53.8

## 2021-05-21 SURGERY — COLONOSCOPY WITH PROPOFOL
Anesthesia: General

## 2021-05-21 MED ORDER — PROPOFOL 500 MG/50ML IV EMUL
INTRAVENOUS | Status: AC
  Filled 2021-05-21: qty 50

## 2021-05-21 MED ORDER — SODIUM CHLORIDE 0.9 % IV SOLN: INTRAVENOUS | Status: DC

## 2021-05-21 MED ORDER — PROPOFOL 500 MG/50ML IV EMUL
INTRAVENOUS | Status: DC | PRN
  Administered 2021-05-21: 50 ug/kg/min via INTRAVENOUS

## 2021-05-21 MED ORDER — LIDOCAINE HCL (CARDIAC) PF 100 MG/5ML IV SOSY
PREFILLED_SYRINGE | INTRAVENOUS | Status: DC | PRN
  Administered 2021-05-21: 60 mg via INTRAVENOUS

## 2021-05-21 MED ORDER — LIDOCAINE HCL (PF) 2 % IJ SOLN
INTRAMUSCULAR | Status: AC
  Filled 2021-05-21: qty 5

## 2021-05-21 MED ORDER — PROPOFOL 10 MG/ML IV BOLUS
INTRAVENOUS | Status: DC | PRN
  Administered 2021-05-21 (×2): 20 mg via INTRAVENOUS
  Administered 2021-05-21: 10 mg via INTRAVENOUS

## 2021-05-21 NOTE — Interval H&P Note (Signed)
History and Physical Interval Note:  05/21/2021 11:00 AM  Kimberly Trevino  has presented today for surgery, with the diagnosis of HX ADEN POLYPS.  The various methods of treatment have been discussed with the patient and family. After consideration of risks, benefits and other options for treatment, the patient has consented to  Procedure(s) with comments: COLONOSCOPY WITH PROPOFOL (N/A) - PLAVIX as a surgical intervention.  The patient's history has been reviewed, patient examined, no change in status, stable for surgery.  I have reviewed the patient's chart and labs.  Questions were answered to the patient's satisfaction.     Lesly Rubenstein  Ok to proceed with colonoscopy

## 2021-05-21 NOTE — Transfer of Care (Signed)
Immediate Anesthesia Transfer of Care Note  Patient: Kimberly Trevino  Procedure(s) Performed: COLONOSCOPY WITH PROPOFOL  Patient Location: PACU  Anesthesia Type:General  Level of Consciousness: sedated  Airway & Oxygen Therapy: Patient Spontanous Breathing and Patient connected to nasal cannula oxygen  Post-op Assessment: Report given to RN and Post -op Vital signs reviewed and stable  Post vital signs: Reviewed and stable  Last Vitals:  Vitals Value Taken Time  BP 113/63 05/21/21 1132  Temp 36.1 C 05/21/21 1132  Pulse 64 05/21/21 1133  Resp 22 05/21/21 1133  SpO2 100 % 05/21/21 1133  Vitals shown include unvalidated device data.  Last Pain:  Vitals:   05/21/21 1132  TempSrc: Temporal  PainSc: Asleep         Complications: No notable events documented.

## 2021-05-21 NOTE — H&P (Signed)
Outpatient short stay form Pre-procedure 05/21/2021  Lesly Rubenstein, MD  Primary Physician: Tracie Harrier, MD  Reason for visit:  Surveillance  History of present illness:   76 y/o lady with history of CAD on plavix here for colonoscopy for history of adenomatous polyp on last colonoscopy 7-8 years ago. No family history of GI malignancies. Last dose of plavix was 7 days ago. History of hysterectomy.    Current Facility-Administered Medications:    0.9 %  sodium chloride infusion, , Intravenous, Continuous, Paislei Dorval, Hilton Cork, MD  Medications Prior to Admission  Medication Sig Dispense Refill Last Dose   cyclobenzaprine (FLEXERIL) 5 MG tablet Take 5 mg by mouth 3 (three) times daily as needed for muscle spasms.      EUTHYROX 25 MCG tablet Take 25 mcg by mouth every morning.   Past Week   famotidine (PEPCID) 40 MG tablet Take 40 mg by mouth daily.   Past Week   fluticasone-salmeterol (ADVAIR) 250-50 MCG/ACT AEPB Inhale 1 puff into the lungs in the morning and at bedtime.   Past Week   isosorbide mononitrate (IMDUR) 30 MG 24 hr tablet Take 30 mg by mouth daily.   05/20/2021   lisinopril (PRINIVIL,ZESTRIL) 20 MG tablet Take 20 mg by mouth daily.   05/20/2021   mirabegron ER (MYRBETRIQ) 50 MG TB24 tablet Take 50 mg by mouth daily.      Multiple Vitamin (MULTIVITAMIN) tablet Take 1 tablet by mouth daily.   Past Week   oxybutynin (DITROPAN XL) 15 MG 24 hr tablet Take 1 tablet (15 mg total) by mouth daily. 90 tablet 3 Past Week   rOPINIRole (REQUIP) 0.5 MG tablet Take 0.5 mg by mouth 3 (three) times daily.   05/20/2021   sertraline (ZOLOFT) 50 MG tablet Take 50 mg by mouth daily.   05/20/2021   VENTOLIN HFA 108 (90 BASE) MCG/ACT inhaler Inhale 2 puffs into the lungs every 4 (four) hours as needed.    Past Month   vitamin C (ASCORBIC ACID) 500 MG tablet Take 500 mg by mouth daily.   Past Month   acetaminophen (TYLENOL) 325 MG tablet Take 2 tablets (650 mg total) by mouth every 6 (six) hours as  needed for mild pain (or Fever >/= 101).      aspirin EC 81 MG EC tablet Take 1 tablet (81 mg total) by mouth daily.   05/14/2021   CARTIA XT 240 MG 24 hr capsule Take 240 mg by mouth daily.      cephALEXin (KEFLEX) 500 MG capsule Take 1 capsule (500 mg total) by mouth 3 (three) times daily. (Patient not taking: Reported on 05/21/2021) 21 capsule 0 Completed Course   Cholecalciferol (VITAMIN D PO) Take by mouth daily. (Patient not taking: Reported on 01/27/2021)      clopidogrel (PLAVIX) 75 MG tablet Take 1 tablet (75 mg total) by mouth daily. 90 tablet 0 05/14/2021   estradiol (ESTRACE) 0.5 MG tablet Take 0.5 mg by mouth daily.      fluocinonide cream (LIDEX) 1.61 % Apply 1 application topically as needed.       loratadine (CLARITIN) 10 MG tablet Take 10 mg by mouth daily.      metoprolol succinate (TOPROL-XL) 100 MG 24 hr tablet Take 100 mg by mouth daily. Take with or immediately following a meal.      Misc Natural Products (GINKOGIN PO) Take by mouth daily.      mupirocin ointment (BACTROBAN) 2 % APPLY TOPICALLY THREE TIMES DAILY  rosuvastatin (CRESTOR) 20 MG tablet Take 1 tablet (20 mg total) by mouth daily at 6 PM. 30 tablet 0    Valerian Root 100 MG CAPS Take 100 mg by mouth at bedtime. (Patient not taking: No sig reported)        Allergies  Allergen Reactions   Doxycycline Itching    Other reaction(s): Angioedema   Sulfa Antibiotics      Past Medical History:  Diagnosis Date   Anxiety    Arthritis    Asthma    B12 deficiency    Chronic low back pain    Coronary artery disease 2011   Cardiac cath in 2011 at [facility]: 50% mid LAD stenosis without evidence of obstructive disease. Normal ejection fraction. Nuclear stress test done in October of 2013 4 atypical chest pain was normal.   Depression    Hyperlipidemia    Hypertension    Obesity    PSVT (paroxysmal supraventricular tachycardia) ([place])    Stroke ([place])    Tachycardia 2010   TIA (transient ischemic attack)    Vitamin  D deficiency     Review of systems:  Otherwise negative.    Physical Exam  Gen: Alert, oriented. Appears stated age.  HEENT: PERRLA. Lungs: No respiratory distress CV: RRR Abd: soft, benign, no masses Ext: No edema    Planned procedures: Proceed with colonoscopy. The patient understands the nature of the planned procedure, indications, risks, alternatives and potential complications including but not limited to bleeding, infection, perforation, damage to internal organs and possible oversedation/side effects from anesthesia. The patient agrees and gives consent to proceed.  Please refer to procedure notes for findings, recommendations and patient disposition/instructions.     Lesly Rubenstein, MD [facility]astroenterology

## 2021-05-21 NOTE — Anesthesia Postprocedure Evaluation (Signed)
Anesthesia Post Note  Patient: Kimberly Trevino  Procedure(s) Performed: COLONOSCOPY WITH PROPOFOL  Anesthesia Type: General Anesthetic complications: no   There were no known notable events for this encounter.   Last Vitals:  Vitals:   05/21/21 1142 05/21/21 1152  BP: 131/72 139/68  Pulse: 75 69  Resp: (!) 23 20  Temp:    SpO2: 100% 99%    Last Pain:  Vitals:   05/21/21 1152  TempSrc:   PainSc: 0-No pain                 [place]ack[place] Blas

## 2021-05-21 NOTE — Op Note (Signed)
[facility] Gastroenterology Patient Name: Kimberly Trevino Procedure Date: 05/21/2021 10:54 AM MRN: [id number] Account #: [id number] Date of Birth: [birth year] Admit Type: Outpatient Age: 76 Room: [facility] ENDO ROOM 3 Gender: Female Note Status: Finalized Instrument Name: Jasper Riling [id number] Procedure:             Colonoscopy Indications:           Surveillance: Personal history of adenomatous polyps                         on last colonoscopy > 5 years ago Providers:             Andrey Farmer MD, MD Referring MD:          No Local Md, MD (Referring MD) Medicines:             Monitored Anesthesia Care Complications:         No immediate complications. Estimated blood loss:                         Minimal. Procedure:             Pre-Anesthesia Assessment:                        - Prior to the procedure, a History and Physical was                         performed, and patient medications and allergies were                         reviewed. The patient is competent. The risks and                         benefits of the procedure and the sedation options and                         risks were discussed with the patient. All questions                         were answered and informed consent was obtained.                         Patient identification and proposed procedure were                         verified by the physician, the nurse, the                         anesthesiologist, the anesthetist and the technician                         in the endoscopy suite. Mental Status Examination:                         alert and oriented. Airway Examination: normal                         oropharyngeal airway and neck mobility. Respiratory  Examination: clear to auscultation. CV Examination:                         normal. Prophylactic Antibiotics: The patient does not                         require prophylactic antibiotics. Prior                          Anticoagulants: The patient has taken Plavix                         (clopidogrel), last dose was 7 days prior to                         procedure. ASA Grade Assessment: II - A patient with                         mild systemic disease. After reviewing the risks and                         benefits, the patient was deemed in satisfactory                         condition to undergo the procedure. The anesthesia                         plan was to use monitored anesthesia care (MAC).                         Immediately prior to administration of medications,                         the patient was re-assessed for adequacy to receive                         sedatives. The heart rate, respiratory rate, oxygen                         saturations, blood pressure, adequacy of pulmonary                         ventilation, and response to care were monitored                         throughout the procedure. The physical status of the                         patient was re-assessed after the procedure.                        After obtaining informed consent, the colonoscope was                         passed under direct vision. Throughout the procedure,                         the patient's blood pressure, pulse, and oxygen  saturations were monitored continuously. The                         Colonoscope was introduced through the anus and                         advanced to the the cecum, identified by appendiceal                         orifice and ileocecal valve. The colonoscopy was                         performed without difficulty. The patient tolerated                         the procedure well. The quality of the bowel                         preparation was good. Findings:      The perianal and digital rectal examinations were normal.      A 1 mm polyp was found in the ascending colon. The polyp was sessile.       The polyp was removed with a jumbo cold  forceps. Resection and retrieval       were complete. Estimated blood loss was minimal.      Multiple small-mouthed diverticula were found in the sigmoid colon and       descending colon.      Internal hemorrhoids were found during retroflexion. The hemorrhoids       were Grade I (internal hemorrhoids that do not prolapse).      Anal papilla(e) were hypertrophied.      The exam was otherwise without abnormality on direct and retroflexion       views. Impression:            - One 1 mm polyp in the ascending colon, removed with                         a jumbo cold forceps. Resected and retrieved.                        - Diverticulosis in the sigmoid colon and in the                         descending colon.                        - Internal hemorrhoids.                        - Anal papilla(e) were hypertrophied.                        - The examination was otherwise normal on direct and                         retroflexion views. Recommendation:        - Discharge patient to home.                        - Resume previous diet.                        -  Resume Plavix (clopidogrel) at prior dose tomorrow.                        - Await pathology results.                        - Repeat colonoscopy in 7 years for surveillance.                        - Return to referring physician as previously                         scheduled. Procedure Code(s):     --- Professional ---                        [phone], Colonoscopy, flexible; with biopsy, single or                         multiple Diagnosis Code(s):     --- Professional ---                        Z86.010, Personal history of colonic polyps                        K63.5, Polyp of colon                        K64.0, First degree hemorrhoids                        K62.89, Other specified diseases of anus and rectum                        K57.30, Diverticulosis of large intestine without                         perforation or abscess without  bleeding CPT copyright 2019 [employer]. All rights reserved. The codes documented in this report are preliminary and upon coder review may  be revised to meet current compliance requirements. Andrey Farmer MD, MD 05/21/2021 11:35:36 AM Number of Addenda: 0 Note Initiated On: 05/21/2021 10:54 AM Scope Withdrawal Time: 0 hours 9 minutes 40 seconds  Total Procedure Duration: 0 hours 17 minutes 8 seconds  Estimated Blood Loss:  Estimated blood loss was minimal.      [facility]

## 2021-05-24 ENCOUNTER — Encounter: Payer: Self-pay | Admitting: Gastroenterology

## 2021-05-24 LAB — SURGICAL PATHOLOGY

## 2021-06-23 ENCOUNTER — Other Ambulatory Visit: Payer: Self-pay | Admitting: Physician Assistant

## 2021-06-23 DIAGNOSIS — M12811 Other specific arthropathies, not elsewhere classified, right shoulder: Secondary | ICD-10-CM

## 2021-06-23 DIAGNOSIS — M25511 Pain in right shoulder: Secondary | ICD-10-CM

## 2021-07-07 ENCOUNTER — Ambulatory Visit
Admission: RE | Admit: 2021-07-07 | Discharge: 2021-07-07 | Disposition: A | Discharge status: Home / Self Care | Payer: Medicare Other | Source: Ambulatory Visit | Attending: Physician Assistant | Admitting: Physician Assistant

## 2021-07-07 ENCOUNTER — Other Ambulatory Visit: Payer: Self-pay

## 2021-07-07 DIAGNOSIS — M12811 Other specific arthropathies, not elsewhere classified, right shoulder: Secondary | ICD-10-CM | POA: Insufficient documentation

## 2021-07-07 DIAGNOSIS — M25511 Pain in right shoulder: Secondary | ICD-10-CM | POA: Diagnosis present

## 2021-07-28 ENCOUNTER — Other Ambulatory Visit: Payer: Self-pay | Admitting: Surgery

## 2021-08-05 ENCOUNTER — Other Ambulatory Visit: Payer: Self-pay

## 2021-08-05 ENCOUNTER — Encounter
Admission: RE | Admit: 2021-08-05 | Discharge: 2021-08-05 | Disposition: A | Discharge status: Home / Self Care | Payer: Medicare Other | Source: Ambulatory Visit | Attending: Surgery | Admitting: Surgery

## 2021-08-05 ENCOUNTER — Other Ambulatory Visit: Payer: Medicare Other

## 2021-08-05 VITALS — BP 108/70 | HR 72 | Resp 16 | Ht 62.0 in | Wt 155.0 lb

## 2021-08-05 DIAGNOSIS — Z01818 Encounter for other preprocedural examination: Secondary | ICD-10-CM

## 2021-08-05 HISTORY — DX: Polyp of colon: K63.5

## 2021-08-05 HISTORY — DX: Hypothyroidism, unspecified: E03.9

## 2021-08-05 HISTORY — DX: Gastro-esophageal reflux disease without esophagitis: K21.9

## 2021-08-05 LAB — URINALYSIS, ROUTINE W REFLEX MICROSCOPIC
Bilirubin Urine: NEGATIVE
Glucose, UA: NEGATIVE mg/dL
Hgb urine dipstick: NEGATIVE
Ketones, ur: NEGATIVE mg/dL
Nitrite: NEGATIVE
Protein, ur: 100 mg/dL — AB
Specific Gravity, Urine: 1.013 (ref 1.005–1.030)
Squamous Epithelial / HPF: NONE SEEN (ref 0–5)
WBC, UA: >50 WBC/hpf — ABNORMAL HIGH (ref 0–5)
pH: 5 (ref 5.0–8.0)

## 2021-08-05 LAB — TYPE AND SCREEN
ABO/RH(D): A POS
Antibody Screen: NEGATIVE

## 2021-08-05 LAB — SURGICAL PCR SCREEN
MRSA, PCR: NEGATIVE
Staphylococcus aureus: NEGATIVE

## 2021-08-05 NOTE — Patient Instructions (Addendum)
Your procedure is scheduled on:08-17-21 Tuesday ?Report to the Registration Desk on the 1st floor of the [facility].Then proceed to the 2nd floor Surgery Desk in the [facility] ?To find out your arrival time, please call [phone] between 1PM - 3PM on:08-16-21 Monday ? ?REMEMBER: ?Instructions that are not followed completely may result in serious medical risk, up to and including death; or upon the discretion of your surgeon and anesthesiologist your surgery may need to be rescheduled. ? ?Do not eat food after midnight the night before surgery.  ?No gum chewing, lozengers or hard candies. ? ?You may however, drink CLEAR liquids up to 2 hours before you are scheduled to arrive for your surgery. Do not drink anything within 2 hours of your scheduled arrival time. ? ?Clear liquids include: ?- water  ?- apple juice without pulp ?- gatorade (not RED colors) ?- black coffee or tea (Do NOT add milk or creamers to the coffee or tea) ?Do NOT drink anything that is not on this list. ? ?In addition, your doctor has ordered for you to drink the provided  ?Ensure Pre-Surgery Clear Carbohydrate Drink  ?Drinking this carbohydrate drink up to two hours before surgery helps to reduce insulin resistance and improve patient outcomes. Please complete drinking 2 hours prior to scheduled arrival time. ? ?TAKE THESE MEDICATIONS THE MORNING OF SURGERY WITH A SIP OF WATER: ?-EUTHYROX  ?-loratadine (CLARITIN) ?-oxybutynin (DITROPAN XL)  ?-famotidine (PEPCID)-take one the night before and one on the morning of surgery - helps to prevent nausea after surgery.) ? ?Stop your clopidogrel (PLAVIX) 5 days prior to surgery per Dr Wannetta Sender dose on 08-11-21 Wednesday ? ?Continue your 81 mg up until the day prior to surgery as instructed by Dr Jasper Loser NOT take the day of surgery ? ?Use your VENTOLIN HFA Inhaler the day of surgery and bring your inhaler to the hospital ? ?One week prior to surgery:Last dose on 08-10-21 Tuesday ?Stop  Anti-inflammatories (NSAIDS) such as Advil, Aleve, Ibuprofen, Motrin, Naproxen, Naprosyn and Aspirin based products such as Excedrin, Goodys Powder, BC Powder.You may however, take Tylenol if needed for pain up until the day of surgery. ? ?Stop ANY OVER THE COUNTER supplements/vitamins 7 days prior to surgery ( Multiple Vitamin, vitamin C) ? ?No Alcohol for 24 hours before or after surgery. ? ?No Smoking including e-cigarettes for 24 hours prior to surgery.  ?No chewable tobacco products for at least 6 hours prior to surgery.  ?No nicotine patches on the day of surgery. ? ?Do not use any "recreational" drugs for at least a week prior to your surgery.  ?Please be advised that the combination of cocaine and anesthesia may have negative outcomes, up to and including death. ?If you test positive for cocaine, your surgery will be cancelled. ? ?On the morning of surgery brush your teeth with toothpaste and water, you may rinse your mouth with mouthwash if you wish. ?Do not swallow any toothpaste or mouthwash. ? ?Use CHG Soap as directed on instruction sheet. ? ?Total Shoulder Arthroplasty:  use Benzolyl Peroxide 5% Gel as directed on instruction sheet ? ?Do not wear jewelry, make-up, hairpins, clips or nail polish. ? ?Do not wear lotions, powders, or perfumes.  ? ?Do not shave body from the neck down 48 hours prior to surgery just in case you cut yourself which could leave a site for infection.  ?Also, freshly shaved skin may become irritated if using the CHG soap. ? ?Contact lenses, hearing aids and dentures may not be  worn into surgery. ? ?Do not bring valuables to the hospital. [facility] is not responsible for any missing/lost belongings or valuables.  ? ?Notify your doctor if there is any change in your medical condition (cold, fever, infection). ? ?Wear comfortable clothing (specific to your surgery type) to the hospital. ? ?After surgery, you can help prevent lung complications by doing breathing exercises.  ?Take  deep breaths and cough every 1-2 hours. Your doctor may order a device called an Incentive Spirometer to help you take deep breaths. ?When coughing or sneezing, hold a pillow firmly against your incision with both hands. This is called ?splinting.? Doing this helps protect your incision. It also decreases belly discomfort. ? ?If you are being admitted to the hospital overnight, leave your suitcase in the car. ?After surgery it may be brought to your room. ? ?If you are being discharged the day of surgery, you will not be allowed to drive home. ?You will need a responsible adult (18 years or older) to drive you home and stay with you that night.  ? ?If you are taking public transportation, you will need to have a responsible adult (18 years or older) with you. ?Please confirm with your physician that it is acceptable to use public transportation.  ? ?Please call the [facility] at [phone] if you have any questions about these instructions. ? ?Surgery Visitation Policy: ? ?Patients undergoing a surgery or procedure may have two family members or support persons with them as long as the person is not COVID-19 positive or experiencing its symptoms.  ? ?Inpatient Visitation:   ? ?Visiting hours are 7 a.m. to 8 p.m. ?Up to four visitors are allowed at one time in a patient room, including children. The visitors may rotate out with other people during the day. One designated support person (adult) may remain overnight. ? ?All Areas: ?All visitors must pass COVID-19 screenings, use hand sanitizer when entering and exiting the patient?s room and wear a mask at all times, including in the patient?s room. ?Patients must also wear a mask when staff or their visitor are in the room. ?Masking is required regardless of vaccination status.  ?

## 2021-08-07 ENCOUNTER — Encounter: Payer: Self-pay | Admitting: Urgent Care

## 2021-08-07 DIAGNOSIS — Z01812 Encounter for preprocedural laboratory examination: Secondary | ICD-10-CM

## 2021-08-07 DIAGNOSIS — B9620 Unspecified Escherichia coli [E. coli] as the cause of diseases classified elsewhere: Secondary | ICD-10-CM

## 2021-08-08 LAB — URINE CULTURE: Culture: >=100000 — AB

## 2021-08-09 MED ORDER — CIPROFLOXACIN HCL 500 MG PO TABS: 500.0000 mg | ORAL_TABLET | Freq: Two times a day (BID) | ORAL | 0 refills | Status: DC

## 2021-08-09 NOTE — Progress Notes (Signed)
?[facility] ?Perioperative Services: Pre-Admission/Anesthesia Testing ? ?Abnormal Lab Notification and Treatment Plan of Care ?  ?Date: 08/09/21 ? ?Name: Kimberly Trevino ?MRN:   [id number] ? ?Re: Abnormal labs noted during PAT appointment  ? ?Notified:  ?Provider Name Provider Role Notification Mode  ?Milagros Evener, MD Orthopedics (Surgeon) Routed and/or faxed via [facility]  ? ?Abnormal Lab Value(s):  ? ?Lab Results  ?Component Value Date  ? COLORURINE YELLOW (A) 08/05/2021  ? APPEARANCEUR CLOUDY (A) 08/05/2021  ? LABSPEC 1.013 08/05/2021  ? PHURINE 5.0 08/05/2021  ? GLUCOSEU NEGATIVE 08/05/2021  ? [place] NEGATIVE 08/05/2021  ? [place] NEGATIVE 08/05/2021  ? [place] NEGATIVE 08/05/2021  ? PROTEINUR 100 (A) 08/05/2021  ? NITRITE NEGATIVE 08/05/2021  ? LEUKOCYTESUR MODERATE (A) 08/05/2021  ? EPIU NONE SEEN 08/05/2021  ? WBCU >50 (H) 08/05/2021  ? RBCU 0-5 08/05/2021  ? BACTERIA RARE (A) 08/05/2021  ? CULT >=100,000 COLONIES/mL ESCHERICHIA COLI (A) 08/05/2021  ? ? ?Clinical Information and Notes:  ?Patient is scheduled for REVERSE SHOULDER ARTHROPLASTY WITH BICEPS TENODESIS on 08/17/2021 ? ?UA performed in PAT consistent with/concerning for infection.  ?Renal function (06/08/2021): BUN 21 and creatinine 0.9 mg/dL (eGFR 74 mL/min) ?Urine C&S added to assess for pathogenically sig ?  ?Impression and Plan:  ?Kimberly Trevino with a UA that was (+) for infection; reflex culture sent.  Culture grew out significant colony count of Escherichia coli. Contacted patient to discuss. Patient reporting that she is experiencing urinary frequency and urgency.  She denies significant dysuria, abdominal pain, back pain, nausea, vomiting, fever/chills.  Patient advising that her urine is malodorous. Patient with surgery scheduled soon. In efforts to avoid delaying patient's procedure, or have her experience any potentially significant perioperative complications related to the aforementioned, I would like to  proceed with treatment for urinary tract infection. ? ?Allergies reviewed. Culture report also reviewed to ensure culture appropriate coverage is being provided. In review of her EMR, patient has had 2 recent urinary tract infections that were caused by Escherichia coli.  Patient was treated towards the end of January with cephalosporin. Will treat with a 5 day course of ciprofloxacin. Patient encouraged to complete the entire course of antibiotics even if she begins to feel better.  ? ?Meds ordered this encounter  ?Medications  ? ciprofloxacin (CIPRO) 500 MG tablet  ?  Sig: Take 1 tablet (500 mg total) by mouth 2 (two) times daily for 5 days. Increase WATER intake while taking this medication.  ?  Dispense:  10 tablet  ?  Refill:  0  ? ?Patient encouraged to increase her fluid intake as much as possible. Discussed that water is always best to flush the urinary tract. She was advised to avoid caffeine containing fluids until her infections clears, as caffeine can cause her to experience painful bladder spasms.  ? ?May use Tylenol as needed for pain/fever should she experience these symptoms.  ? ?Patient instructed to call surgeon's office or PAT with any questions or concerns related to the above outlined course of treatment. Additionally, she was instructed to call if she feels like she is getting worse overall while on treatment. Results and treatment plan of care forwarded to primary attending surgeon to make them aware.  ? ?Encounter Diagnoses  ?Name Primary?  ? Pre-operative laboratory examination Yes  ? E. coli UTI (urinary tract infection)   ? ?Honor Loh, MSN, APRN, FNP-C, CEN ?[facility]  ?Peri-operative Services Nurse Practitioner ?Phone: [phone] ?Fax: [phone] ?08/09/21  11:55 AM ? ?NOTE: This note has been prepared using [employer]. Despite my best ability to proofread, there is always the potential that unintentional transcriptional errors may still occur  from this process. ? ?

## 2021-08-10 ENCOUNTER — Other Ambulatory Visit: Payer: Self-pay

## 2021-08-10 ENCOUNTER — Encounter: Payer: Self-pay | Admitting: Emergency Medicine

## 2021-08-10 ENCOUNTER — Emergency Department
Admission: EM | Admit: 2021-08-10 | Discharge: 2021-08-10 | Disposition: A | Discharge status: Home / Self Care | Payer: Medicare Other | Attending: Emergency Medicine | Admitting: Emergency Medicine

## 2021-08-10 DIAGNOSIS — N3000 Acute cystitis without hematuria: Secondary | ICD-10-CM | POA: Diagnosis not present

## 2021-08-10 DIAGNOSIS — R102 Pelvic and perineal pain: Secondary | ICD-10-CM | POA: Diagnosis present

## 2021-08-10 LAB — URINALYSIS, COMPLETE (UACMP) WITH MICROSCOPIC
Bilirubin Urine: NEGATIVE
Glucose, UA: NEGATIVE mg/dL
Hgb urine dipstick: NEGATIVE
Ketones, ur: NEGATIVE mg/dL
Nitrite: NEGATIVE
Protein, ur: NEGATIVE mg/dL
Specific Gravity, Urine: 1.003 — ABNORMAL LOW (ref 1.005–1.030)
pH: 6 (ref 5.0–8.0)

## 2021-08-10 LAB — CBC
HCT: 38.9 % (ref 36.0–46.0)
Hemoglobin: 13.2 g/dL (ref 12.0–15.0)
MCH: 28 pg (ref 26.0–34.0)
MCHC: 33.9 g/dL (ref 30.0–36.0)
MCV: 82.6 fL (ref 80.0–100.0)
Platelets: 326 10*3/uL (ref 150–400)
RBC: 4.71 MIL/uL (ref 3.87–5.11)
RDW: 13.3 % (ref 11.5–15.5)
WBC: 5.4 10*3/uL (ref 4.0–10.5)
nRBC: 0 % (ref 0.0–0.2)

## 2021-08-10 LAB — BASIC METABOLIC PANEL
Anion gap: 11 (ref 5–15)
BUN: 14 mg/dL (ref 8–23)
CO2: 23 mmol/L (ref 22–32)
Calcium: 9.9 mg/dL (ref 8.9–10.3)
Chloride: 103 mmol/L (ref 98–111)
Creatinine, Ser: 0.91 mg/dL (ref 0.44–1.00)
GFR, Estimated: >60 mL/min (ref >60)
Glucose, Bld: 119 mg/dL — ABNORMAL HIGH (ref 70–99)
Potassium: 3.9 mmol/L (ref 3.5–5.1)
Sodium: 137 mmol/L (ref 135–145)

## 2021-08-10 MED ORDER — CEFTRIAXONE SODIUM 1 G IJ SOLR
500.0000 mg | Freq: Once | INTRAMUSCULAR | Status: AC
  Administered 2021-08-10: 500 mg via INTRAMUSCULAR
  Filled 2021-08-10: qty 10

## 2021-08-10 MED ORDER — CEPHALEXIN 500 MG PO CAPS: 500.0000 mg | ORAL_CAPSULE | Freq: Three times a day (TID) | ORAL | 0 refills | Status: DC

## 2021-08-10 MED ORDER — LIDOCAINE HCL (PF) 1 % IJ SOLN
5.0000 mL | Freq: Once | INTRAMUSCULAR | Status: AC
  Administered 2021-08-10: 5 mL
  Filled 2021-08-10: qty 5

## 2021-08-10 NOTE — ED Provider Notes (Signed)
? ? ?[facility] ?Emergency Department Provider Note ? ? ? ? Event Date/Time  ? First MD Initiated Contact with Patient 08/10/21 1406   ?  (approximate) ? ? ?History  ? ?Urinary Tract Infection ? ? ?HPI ? ?Kimberly Trevino is a 76 y.o. female to the ED with ongoing pelvic pain, urinary frequency, and malodorous urine.  Patient was evaluated and started on antibiotic yesterday for her confirmed UTI, yesterday.  She has had 2 doses of her Cipro since that time.  She denies any ongoing fevers, nausea, vomiting, or diarrhea. ?  ? ? ?Physical Exam  ? ?Triage Vital Signs: ?ED Triage Vitals  ?Enc Vitals Group  ?   BP 08/10/21 1206 (!) 141/83  ?   Pulse Rate 08/10/21 1206 97  ?   Resp 08/10/21 1206 19  ?   Temp 08/10/21 1206 98.3 ?F (36.8 ?C)  ?   Temp Source 08/10/21 1206 Oral  ?   SpO2 08/10/21 1206 97 %  ?   Weight 08/10/21 1402 154 lb 15.7 oz (70.3 kg)  ?   Height 08/10/21 1402 '5\' 2"'$  (1.575 m)  ?   Head Circumference --   ?   Peak Flow --   ?   Pain Score 08/10/21 1207 10  ?   Pain Loc --   ?   Pain Edu? --   ?   Excl. in [place]? --   ? ? ?Most recent vital signs: ?Vitals:  ? 08/10/21 1206  ?BP: (!) 141/83  ?Pulse: 97  ?Resp: 19  ?Temp: 98.3 ?F (36.8 ?C)  ?SpO2: 97%  ? ? ?General Awake, no distress.  ?CV:  Good peripheral perfusion.  ?RESP:  Normal effort.  ?ABD:  No distention.  ? ?ED Results / Procedures / Treatments  ? ?Labs ?(all labs ordered are listed, but only abnormal results are displayed) ?Labs Reviewed  ?BASIC METABOLIC PANEL - Abnormal; Notable for the following components:  ?    Result Value  ? Glucose, Bld 119 (*)   ? All other components within normal limits  ?URINALYSIS, COMPLETE (UACMP) WITH MICROSCOPIC - Abnormal; Notable for the following components:  ? Color, Urine STRAW (*)   ? APPearance CLEAR (*)   ? Specific Gravity, Urine 1.003 (*)   ? Leukocytes,Ua MODERATE (*)   ? Bacteria, UA RARE (*)   ? All other components within normal limits  ?CBC  ? ? ? ?EKG ? ? ?RADIOLOGY ? ?No  results found. ? ? ?PROCEDURES: ? ?Critical Care performed: No ? ?Procedures ? ? ?MEDICATIONS ORDERED IN ED: ?Medications  ?cefTRIAXone (ROCEPHIN) injection 500 mg (500 mg Intramuscular Given 08/10/21 1430)  ?lidocaine (PF) (XYLOCAINE) 1 % injection 5 mL (5 mLs Other Given 08/10/21 1430)  ? ? ? ?IMPRESSION / MDM / ASSESSMENT AND PLAN / ED COURSE  ?I reviewed the triage vital signs and the nursing notes. ?             ?               ? ?Differential diagnosis includes, but is not limited to, UTI, pyelonephritis ? ?Geriatric patient to the ED for evaluation of some ongoing symptoms due to a recent UTI.  Patient is on Cipro and has had 2 doses since her diagnosis.  Her UA today reveals some rare bacteriuria.  Despite that patient still has some symptomatic complaints. She will be treated with a single dose of rocephen in the ED. Additionally, I will change her  Cipro to Keflex, due to increased resistance. patient's diagnosis is consistent with UTI. Patient is to follow up with her PCP as needed or otherwise directed. Patient is given ED precautions to return to the ED for any worsening or new symptoms. ? ? ?FINAL CLINICAL IMPRESSION(S) / ED DIAGNOSES  ? ?Final diagnoses:  ?Acute cystitis without hematuria  ? ? ? ?Rx / DC Orders  ? ?ED Discharge Orders   ? ?      Ordered  ?  cephALEXin (KEFLEX) 500 MG capsule  3 times daily       ? 08/10/21 1418  ? ?  ?  ? ?  ? ? ? ?Note:  This document was prepared using Dragon voice recognition software and may include unintentional dictation errors. ? ?  ?Melvenia Needles, PA-C ?08/10/21 1445 ? ?  ?Rada Hay, MD ?08/10/21 1547 ? ?

## 2021-08-10 NOTE — ED Notes (Signed)
See triage note  presents with pain with urination  was recently dx'd with UTI  she is taking Cipro but developed increased pain with urination today ?

## 2021-08-10 NOTE — ED Triage Notes (Signed)
Pt via POV from home. Pt c/o pelvic pain, urinary frequency, and foul odor to her urine. Pt states its been going on over a week. Pt just started on her antibiotics yesterday. Pt seems uncomfortable during triage states "its so much pressure" Pt is A&OX4 and NAD ?

## 2021-08-11 ENCOUNTER — Encounter: Payer: Self-pay | Admitting: Surgery

## 2021-08-11 NOTE — Progress Notes (Signed)
?Perioperative Services ? ?Pre-Admission/Anesthesia Testing Clinical Review ? ?Date: 08/11/21 ? ?Patient Demographics:  ?Name: Kimberly Trevino ?DOB:   06/24/1945 ?MRN:   [id number] ? ?Planned Surgical Procedure(s):  ? ? Case: [id number] Date/Time: 08/17/21 0715  ? Procedure: REVERSE SHOULDER ARTHROPLASTY WITH BICEPS TENODESIS (Right: Shoulder)  ? Anesthesia type: Choice  ? Pre-op diagnosis:  ?    Rotator cuff arthropathy, right M12.811 ?    Rotator cuff tendinitis, right M75.81 ?    Nontraumatic complete tear of right rotator cuff M75.121 ?    Closed nondisplaced fracture of acromial process of right scapula, initial encounter S42.124  ? Location: [facility] OR ROOM 03 / [facility] ORS FOR ANESTHESIA GROUP  ? Surgeons: Corky Mull, MD  ? ?NOTE: Available PAT nursing documentation and vital signs have been reviewed. Clinical nursing staff has updated patient's PMH/PSHx, current medication list, and drug allergies/intolerances to ensure comprehensive history available to assist in medical decision making as it pertains to the aforementioned surgical procedure and anticipated anesthetic course. Extensive review of available clinical information performed. [place] PMH and PSHx updated with any diagnoses/procedures that  may have been inadvertently omitted during her intake with the pre-admission testing department's nursing staff. ? ?Clinical Discussion:  ?Kimberly Trevino is a 76 y.o. female who is submitted for pre-surgical anesthesia review and clearance prior to her undergoing the above procedure. Patient has never been a smoker. Pertinent PMH includes: CAD, diastolic dysfunction, CVA/TIA, carotid artery stenosis, HTN, HLD, hypothyroidism, GERD (on daily H2 blocker), asthma, OA, chronic low back pain, OAB with urinary incontinence, depression, anxiety. ? ?Patient is followed by cardiology Flonnie Overman, MD). She was last seen in the cardiology clinic on 04/29/2021; notes reviewed.  At the time of her clinic visit, patient doing  well overall from a cardiovascular perspective.  Patient denied any episodes of chest pain, short of breath, PND, orthopnea, palpitations, significant peripheral edema, vertiginous symptoms, or presyncope/syncope.  Patient with complaints of reflux and back pain, however the symptoms were at baseline.  Past medical history significant for cardiovascular diagnoses.  ? ?Patient underwent diagnostic left heart catheterization in 2011.  Records regarding cardiovascular procedure not available for review at time of consult.  Information was obtained from patient and primary cardiology notes.  Patient reported to have had a 50% stenosis in her mid LAD.  Intervention was deferred opting for medical management. ? ?Myocardial perfusion imaging study performed on 06/13/2014 revealing a normal left ventricular systolic function with an EF of 73%.  There was no evidence of stress-induced myocardial ischemia or arrhythmia.  Study determined to be normal low risk. ? 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  BILATERAL carotid Doppler study revealed mild (1-49%) stenosis in the BILATERAL ICAs. MRI imaging of the brain revealed a small LEFT frontal MCA territory infarct.  Patient has no residual deficits following cerebrovascular event. ? ?TTE performed on 04/11/2016 revealed a normal left ventricular systolic function with an EF of 60%.  There were no wall motion abnormalities.  Diastolic Doppler parameters consistent with abnormal relaxation (G1DD).  There was a calcified mitral valve annulus with mild regurgitation.  Left atrium was mildly dilated.  ? ?Transesophageal echocardiogram performed on 06/13/2016 revealing a normal left ventricular systolic function with an EF of 55-60%.  There were no regional wall motion abnormalities.  There was no evidence of an LAA thrombus there was no evidence of PFO or atrial level shunting. ? ?Diagnostic left heart catheterization performed on 06/04/2020 revealing multivessel CAD; 70%  proximal and mid  LAD, 90% ostial D1, 70% ramus intermedius, 70-80% circumflex marginal branch, and 60% stenosis of the RCA.  Cardiology noted that anatomy was not ideally suited for PCI.  CVTS consult recommended for consideration of CABG procedure if patient developed refractory symptoms. ? ?Given patient's CAD and history of CVA, patient on daily DAPT therapy (ASA + clopidogrel).  Patient is reported to be compliant with therapy with no evidence or reports of GI bleeding.  Blood pressure reasonably controlled at 142/80 on currently prescribed CCB, nitrate, ACEi, and beta-blocker therapies.  Patient is on a statin for her HLD and further ASCVD prevention. Functional capacity, as defined by DASI, is documented as being >/= 4 METS.  No changes were made to her medication regimen.  Patient follow-up with outpatient cardiology in 6 months or sooner if needed. ? ?Kimberly Trevino is scheduled for an elective RIGHT REVERSE SHOULDER ARTHROPLASTY WITH BICEPS TENODESIS on 08/17/2021 with Dr. Milagros Evener, MD.  Given patient's past medical history significant for cardiovascular diagnoses, presurgical cardiac clearance was sought by the PAT team. Per cardiology, "this patient is optimized for surgery and may proceed with the planned procedural course with an ACCEPTABLE risk of significant perioperative cardiovascular complications".  Again, this patient is on daily DAPT therapy.  She has been instructed on recommendations from her cardiologist and surgeon for holding her clopidogrel for 5 days prior to her procedure with plans to restart as soon as her postoperative bleeding risk is felt to be minimized by her primary attending surgeon.  The patient is aware that her last dose of clopidogrel will be on 08/11/2021.  Cardiology recommended that patient continue her daily low-dose ASA throughout her perioperative course. ? ?Patient denies previous perioperative complications with anesthesia in the past. In review of the  available records, it is noted that patient underwent a general anesthetic course here (ASA III) in 05/2021 without documented complications.  ? ? ?  08/10/2021  ?  2:02 PM 08/10/2021  ? 12:06 PM 08/05/2021  ?  2:46 PM  ?Vitals with BMI  ?Height '5\' 2"'$   '5\' 2"'$   ?Weight 155 lbs  155 lbs  ?BMI 28.34  28.34  ?Systolic  027 741  ?Diastolic  83 70  ?Pulse  97 72  ? ? ?Providers/Specialists:  ? ?NOTE: Primary physician provider listed below. Patient may have been seen by APP or partner within same practice.  ? ?PROVIDER ROLE / SPECIALTY LAST OV  ?Poggi, Marshall Cork, MD Orthopedics (Surgeon) 07/26/2021  ?Tracie Harrier, MD Primary Care Provider 08/06/2021  ?Harlow Ohms, MD Cardiology 04/29/2021  ?Ephriam Jenkins, MD Rheumatology 06/17/2021  ? ?Allergies:  ?Doxycycline and Sulfa antibiotics ? ?Current Home Medications:  ? ?No current facility-administered medications for this encounter.  ? ? acetaminophen (TYLENOL) 325 MG tablet  ? aspirin EC 81 MG EC tablet  ? CARTIA XT 240 MG 24 hr capsule  ? clopidogrel (PLAVIX) 75 MG tablet  ? cyclobenzaprine (FLEXERIL) 5 MG tablet  ? estradiol (ESTRACE) 0.5 MG tablet  ? EUTHYROX 25 MCG tablet  ? famotidine (PEPCID) 40 MG tablet  ? fluocinonide cream (LIDEX) 0.05 %  ? fluticasone-salmeterol (ADVAIR) 250-50 MCG/ACT AEPB  ? gabapentin (NEURONTIN) 100 MG capsule  ? GINKGO BILOBA PO  ? isosorbide mononitrate (IMDUR) 30 MG 24 hr tablet  ? lisinopril (PRINIVIL,ZESTRIL) 20 MG tablet  ? loratadine (CLARITIN) 10 MG tablet  ? metoprolol succinate (TOPROL-XL) 100 MG 24 hr tablet  ? mirabegron ER (MYRBETRIQ) 50 MG TB24 tablet  ? Multiple Vitamin (MULTIVITAMIN) tablet  ?  mupirocin ointment (BACTROBAN) 2 %  ? Naphazoline-Pheniramine (EQ EYE ALLERGY RELIEF) 0.027-0.315 % SOLN  ? oxybutynin (DITROPAN XL) 15 MG 24 hr tablet  ? rosuvastatin (CRESTOR) 40 MG tablet  ? sertraline (ZOLOFT) 50 MG tablet  ? VENTOLIN HFA 108 (90 BASE) MCG/ACT inhaler  ? vitamin C (ASCORBIC ACID) 500 MG tablet  ? cephALEXin (KEFLEX) 500  MG capsule  ? metaxalone (SKELAXIN) 800 MG tablet  ? ?History:  ? ?Past Medical History:  ?Diagnosis Date  ? Acute ischemic left MCA stroke ([place]) 04/11/2016  ? a.) small LEFT frontal MCA territory infarct  ? Anx

## 2021-08-11 NOTE — Progress Notes (Signed)
This patient is scheduled for a reverse total shoulder arthroplasty.  It is well recognized that patients with preoperative urinary tract infections are at increased risk for infection of the prosthetic joint.  This is a potentially devastating complication.  Therefore, I feel it is medically necessary that a urinalysis be performed prior to surgery in order to determine if the patient does have a UTI.  If she does, then this can be treated appropriately prior to her surgery.  Thank you! ?

## 2021-08-16 ENCOUNTER — Other Ambulatory Visit
Admission: RE | Admit: 2021-08-16 | Discharge: 2021-08-16 | Disposition: A | Discharge status: Home / Self Care | Payer: Medicare Other | Source: Ambulatory Visit | Attending: Surgery | Admitting: Surgery

## 2021-08-16 DIAGNOSIS — S42124A Nondisplaced fracture of acromial process, right shoulder, initial encounter for closed fracture: Secondary | ICD-10-CM | POA: Diagnosis not present

## 2021-08-16 DIAGNOSIS — M7581 Other shoulder lesions, right shoulder: Secondary | ICD-10-CM | POA: Diagnosis not present

## 2021-08-16 DIAGNOSIS — M75121 Complete rotator cuff tear or rupture of right shoulder, not specified as traumatic: Secondary | ICD-10-CM | POA: Insufficient documentation

## 2021-08-16 DIAGNOSIS — N390 Urinary tract infection, site not specified: Secondary | ICD-10-CM | POA: Diagnosis not present

## 2021-08-16 DIAGNOSIS — Z01812 Encounter for preprocedural laboratory examination: Secondary | ICD-10-CM | POA: Insufficient documentation

## 2021-08-16 DIAGNOSIS — B9620 Unspecified Escherichia coli [E. coli] as the cause of diseases classified elsewhere: Secondary | ICD-10-CM | POA: Insufficient documentation

## 2021-08-16 DIAGNOSIS — R300 Dysuria: Secondary | ICD-10-CM | POA: Insufficient documentation

## 2021-08-16 DIAGNOSIS — M12811 Other specific arthropathies, not elsewhere classified, right shoulder: Secondary | ICD-10-CM | POA: Insufficient documentation

## 2021-08-16 DIAGNOSIS — R8290 Unspecified abnormal findings in urine: Secondary | ICD-10-CM

## 2021-08-16 LAB — URINALYSIS, COMPLETE (UACMP) WITH MICROSCOPIC
Bacteria, UA: NONE SEEN
Bilirubin Urine: NEGATIVE
Glucose, UA: NEGATIVE mg/dL
Ketones, ur: NEGATIVE mg/dL
Leukocytes,Ua: NEGATIVE
Nitrite: NEGATIVE
Protein, ur: NEGATIVE mg/dL
Specific Gravity, Urine: 1.003 — ABNORMAL LOW (ref 1.005–1.030)
pH: 8 (ref 5.0–8.0)

## 2021-08-16 MED ORDER — PHENAZOPYRIDINE HCL 200 MG PO TABS: 200.0000 mg | ORAL_TABLET | Freq: Three times a day (TID) | ORAL | 0 refills | Status: AC | PRN

## 2021-08-16 NOTE — Progress Notes (Signed)
?Perioperative Services ?Pre-Admission/Anesthesia Testing ? ?  ?Date: 08/16/21 ? ?Name: Kimberly Trevino ?MRN:   [id number] ? ?Re: Preoperative UTI symptoms ? ?Planned Surgical Procedure(s):  ? ? Case: [id number] Date/Time: 08/17/21 0715  ? Procedure: REVERSE SHOULDER ARTHROPLASTY WITH BICEPS TENODESIS (Right: Shoulder)  ? Anesthesia type: Choice  ? Pre-op diagnosis:  ?    Rotator cuff arthropathy, right M12.811 ?    Rotator cuff tendinitis, right M75.81 ?    Nontraumatic complete tear of right rotator cuff M75.121 ?    Closed nondisplaced fracture of acromial process of right scapula, initial encounter S42.124A  ? Location: [facility] OR ROOM 03 / [facility]  ? Surgeons: Corky Mull, MD  ? ?Clinical Notes:  ?Patient scheduled for the above procedure tomorrow (08/17/2021) with Dr. Milagros Evener, MD. ? ?Preoperative work-up, patient presented to the PAT clinic on 08/05/2021, at which time patient provided an additional urine sample that was cloudy and malodorous; sample was (+) for moderate LE, >50 WBC/hpf, and rare bacteria.  Subsequent culture revealed significant Escherichia coli colony count.   ? ?Attempted to contact patient via MyChart and phone over the weekend, however unable to speak with patient until 08/09/2021.  Patient was started on course of ciprofloxacin and advised to increase fluid intake. ? ?The following day (08/10/2021), patient presented to the [facility] ED for worsening dysuria.  Ciprofloxacin was discontinued.  Patient was given IM ceftriaxone and started on a course of oral cephalexin.   ? ?Patient returned call to the PAT clinic on the morning of 08/16/2021 with reports of still worsening dysuria and suprapubic pressure.  Patient denied any fever/chills, nausea, vomiting, or significant back/abdominal pain.  Discussed presurgical plan of care as patient continues on prescribed cephalexin course.  Recommended OTC phenazopyridine, however patient declined citing that "this will not work"  for her.  Patient stated that if something was not done about her symptoms, she was going to present to the ED at [facility] for further evaluation. In efforts to avoid patient having to go to the ED today, alternative evaluation option provided to patient.  Offered to allow patient to present back to the PAT clinic at [facility] for repeat urine study; patient accepted. ? ?Repeat UA provided in the PAT clinic today (08/16/2021) with results as follows: ?Hospital Outpatient Visit on 08/16/2021  ?Component Date Value Ref Range Status  ? Color, Urine 08/16/2021 STRAW (A)  YELLOW Final  ? APPearance 08/16/2021 CLEAR (A)  CLEAR Final  ? Specific Gravity, Urine 08/16/2021 1.003 (L)  1.005 - 1.030 Final  ? pH 08/16/2021 8.0  5.0 - 8.0 Final  ? Glucose, UA 08/16/2021 NEGATIVE  NEGATIVE mg/dL Final  ? Hgb urine dipstick 08/16/2021 SMALL (A)  NEGATIVE Final  ? Bilirubin Urine 08/16/2021 NEGATIVE  NEGATIVE Final  ? Ketones, ur 08/16/2021 NEGATIVE  NEGATIVE mg/dL Final  ? Protein, ur 08/16/2021 NEGATIVE  NEGATIVE mg/dL Final  ? Nitrite 08/16/2021 NEGATIVE  NEGATIVE Final  ? Leukocytes,Ua 08/16/2021 NEGATIVE  NEGATIVE Final  ? RBC / HPF 08/16/2021 0-5  0 - 5 RBC/hpf Final  ? WBC, UA 08/16/2021 0-5  0 - 5 WBC/hpf Final  ? Bacteria, UA 08/16/2021 NONE SEEN  NONE SEEN Final  ? Squamous Epithelial / LPF 08/16/2021 0-5  0 - 5 Final  ? Performed at [facility], [address] [place],  [address]  ? ?Impression and Plan:  ?Kimberly Trevino with ongoing urinary symptoms.  UA repeated today demonstrating resolution of previously noted infection.  Symptoms could represent candidal vaginitis with +/-urethritis.  Offered prescriptions for phenazopyridine and fluconazole, however patient declined citing the fact that she has both of these at home and they would "not help her".  Reviewed that OTC Azo is 99 mg, whereas what I am planning on sending is 200 mg. Given the option of taking 2 of the OTC tablets versus me sending in a  prescription. Patient stated that she would just take what she had at home. Discussed that urine showing no signs of active infection at this point.  She was encouraged to continue currently prescribed oral antimicrobial course until completion.  Additionally, discussed that patient will receive additional and microbial coverage tomorrow prior to surgery (cefazolin 2 g). Patient encouraged to contact PCP and/or surgeon's office for further concerns.  ? ?ADDENDUM 08/16/2021 @ 1222 PM: Patient returned call to PAT clinic stated that she would take "the stronger Azo" that I recommended. Rx sent for phenazopyridine 200 mg TID to be used on a PRN basis for no more than 3 days (Disp #9). Patient encouraged to increase water intake as much as possible in efforts to flush her urinary tract. Copy of note forwarded to Dr. Roland Rack for review to make him aware of the current treatment plan of care.  ? ?Meds ordered this encounter  ?Medications  ? phenazopyridine (PYRIDIUM) 200 MG tablet  ?  Sig: Take 1 tablet (200 mg total) by mouth 3 (three) times daily as needed for pain. May use for up to 3 days.  ?  Dispense:  9 tablet  ?  Refill:  0  ? ?Honor Loh, MSN, APRN, FNP-C, CEN ?[place]  ?Peri-operative Services Nurse Practitioner ?Phone: [phone] ?08/16/21 10:40 AM ? ?NOTE: This note has been prepared using [employer]. Despite my best ability to proofread, there is always the potential that unintentional transcriptional errors may still occur from this process. ?

## 2021-08-17 ENCOUNTER — Encounter: Payer: Self-pay | Admitting: Surgery

## 2021-08-17 ENCOUNTER — Ambulatory Visit: Payer: Medicare Other

## 2021-08-17 ENCOUNTER — Other Ambulatory Visit: Payer: Self-pay

## 2021-08-17 ENCOUNTER — Encounter
Admission: RE | Disposition: A | Discharge status: Home / Self Care | Payer: Self-pay | Source: Home / Self Care | Attending: Surgery

## 2021-08-17 ENCOUNTER — Ambulatory Visit
Admission: RE | Admit: 2021-08-17 | Discharge: 2021-08-17 | Disposition: A | Discharge status: Home / Self Care | Payer: Medicare Other | Attending: Surgery | Admitting: Surgery

## 2021-08-17 ENCOUNTER — Ambulatory Visit: Payer: Medicare Other | Admitting: Urgent Care

## 2021-08-17 DIAGNOSIS — Z7982 Long term (current) use of aspirin: Secondary | ICD-10-CM | POA: Insufficient documentation

## 2021-08-17 DIAGNOSIS — M75121 Complete rotator cuff tear or rupture of right shoulder, not specified as traumatic: Secondary | ICD-10-CM | POA: Diagnosis present

## 2021-08-17 DIAGNOSIS — I471 Supraventricular tachycardia: Secondary | ICD-10-CM | POA: Diagnosis not present

## 2021-08-17 DIAGNOSIS — M7581 Other shoulder lesions, right shoulder: Secondary | ICD-10-CM | POA: Diagnosis not present

## 2021-08-17 DIAGNOSIS — Z6828 Body mass index (BMI) 28.0-28.9, adult: Secondary | ICD-10-CM | POA: Diagnosis not present

## 2021-08-17 DIAGNOSIS — K219 Gastro-esophageal reflux disease without esophagitis: Secondary | ICD-10-CM | POA: Insufficient documentation

## 2021-08-17 DIAGNOSIS — E669 Obesity, unspecified: Secondary | ICD-10-CM | POA: Insufficient documentation

## 2021-08-17 DIAGNOSIS — F419 Anxiety disorder, unspecified: Secondary | ICD-10-CM | POA: Diagnosis not present

## 2021-08-17 DIAGNOSIS — F32A Depression, unspecified: Secondary | ICD-10-CM | POA: Insufficient documentation

## 2021-08-17 DIAGNOSIS — E039 Hypothyroidism, unspecified: Secondary | ICD-10-CM | POA: Insufficient documentation

## 2021-08-17 DIAGNOSIS — Z8673 Personal history of transient ischemic attack (TIA), and cerebral infarction without residual deficits: Secondary | ICD-10-CM | POA: Insufficient documentation

## 2021-08-17 DIAGNOSIS — I6523 Occlusion and stenosis of bilateral carotid arteries: Secondary | ICD-10-CM | POA: Diagnosis not present

## 2021-08-17 DIAGNOSIS — I2510 Atherosclerotic heart disease of native coronary artery without angina pectoris: Secondary | ICD-10-CM | POA: Insufficient documentation

## 2021-08-17 DIAGNOSIS — I10 Essential (primary) hypertension: Secondary | ICD-10-CM | POA: Diagnosis not present

## 2021-08-17 DIAGNOSIS — Z7902 Long term (current) use of antithrombotics/antiplatelets: Secondary | ICD-10-CM | POA: Insufficient documentation

## 2021-08-17 DIAGNOSIS — J45909 Unspecified asthma, uncomplicated: Secondary | ICD-10-CM | POA: Diagnosis not present

## 2021-08-17 DIAGNOSIS — M19011 Primary osteoarthritis, right shoulder: Secondary | ICD-10-CM | POA: Diagnosis not present

## 2021-08-17 DIAGNOSIS — N3281 Overactive bladder: Secondary | ICD-10-CM | POA: Diagnosis not present

## 2021-08-17 DIAGNOSIS — N3946 Mixed incontinence: Secondary | ICD-10-CM

## 2021-08-17 HISTORY — DX: Occlusion and stenosis of bilateral carotid arteries: I65.23

## 2021-08-17 HISTORY — DX: Paresthesia of skin: R20.2

## 2021-08-17 HISTORY — DX: Overactive bladder: N32.81

## 2021-08-17 HISTORY — DX: Other ill-defined heart diseases: I51.89

## 2021-08-17 HISTORY — DX: Long term (current) use of antithrombotics/antiplatelets: Z79.02

## 2021-08-17 HISTORY — DX: Unspecified urinary incontinence: R32

## 2021-08-17 HISTORY — DX: Cystocele, unspecified: N81.10

## 2021-08-17 HISTORY — PX: REVERSE SHOULDER ARTHROPLASTY: SHX5054

## 2021-08-17 LAB — ABO/RH: ABO/RH(D): A POS

## 2021-08-17 SURGERY — ARTHROPLASTY, SHOULDER, TOTAL, REVERSE
Anesthesia: General | Site: Shoulder | Laterality: Right

## 2021-08-17 MED ORDER — ROCURONIUM BROMIDE 100 MG/10ML IV SOLN
INTRAVENOUS | Status: DC | PRN
Start: 2021-08-17 — End: 2021-08-17
  Administered 2021-08-17: 40 mg via INTRAVENOUS

## 2021-08-17 MED ORDER — ACETAMINOPHEN 10 MG/ML IV SOLN
INTRAVENOUS | Status: DC | PRN
  Administered 2021-08-17: 1000 mg via INTRAVENOUS

## 2021-08-17 MED ORDER — OXYCODONE HCL 5 MG PO TABS: 5.0000 mg | ORAL_TABLET | Freq: Once | ORAL | Status: DC | PRN

## 2021-08-17 MED ORDER — DEXAMETHASONE SODIUM PHOSPHATE 10 MG/ML IJ SOLN
INTRAMUSCULAR | Status: DC | PRN
Start: 2021-08-17 — End: 2021-08-17
  Administered 2021-08-17: 4 mg via INTRAVENOUS

## 2021-08-17 MED ORDER — FENTANYL CITRATE (PF) 100 MCG/2ML IJ SOLN: 25.0000 ug | INTRAMUSCULAR | Status: DC | PRN

## 2021-08-17 MED ORDER — CEFAZOLIN SODIUM-DEXTROSE 2-4 GM/100ML-% IV SOLN: 2.0000 g | Freq: Four times a day (QID) | INTRAVENOUS | Status: DC

## 2021-08-17 MED ORDER — OXYCODONE HCL 5 MG/5ML PO SOLN: 5.0000 mg | Freq: Once | ORAL | Status: DC | PRN

## 2021-08-17 MED ORDER — EPHEDRINE SULFATE (PRESSORS) 50 MG/ML IJ SOLN
INTRAMUSCULAR | Status: DC | PRN
  Administered 2021-08-17: 5 mg via INTRAVENOUS
  Administered 2021-08-17: 10 mg via INTRAVENOUS

## 2021-08-17 MED ORDER — SUGAMMADEX SODIUM 200 MG/2ML IV SOLN
INTRAVENOUS | Status: DC | PRN
Start: 2021-08-17 — End: 2021-08-17
  Administered 2021-08-17: 200 mg via INTRAVENOUS

## 2021-08-17 MED ORDER — PROPOFOL 10 MG/ML IV BOLUS
INTRAVENOUS | Status: DC | PRN
  Administered 2021-08-17: 130 mg via INTRAVENOUS

## 2021-08-17 MED ORDER — ACETAMINOPHEN 10 MG/ML IV SOLN
INTRAVENOUS | Status: AC
  Filled 2021-08-17: qty 100

## 2021-08-17 MED ORDER — ORAL CARE MOUTH RINSE: 15.0000 mL | Freq: Once | OROMUCOSAL | Status: AC

## 2021-08-17 MED ORDER — SODIUM CHLORIDE 0.9 % IR SOLN
Status: DC | PRN
  Administered 2021-08-17: 3000 mL

## 2021-08-17 MED ORDER — PHENYLEPHRINE HCL (PRESSORS) 10 MG/ML IV SOLN
INTRAVENOUS | Status: AC
  Filled 2021-08-17: qty 1

## 2021-08-17 MED ORDER — BUPIVACAINE-EPINEPHRINE (PF) 0.5% -1:200000 IJ SOLN
INTRAMUSCULAR | Status: AC
  Filled 2021-08-17: qty 30

## 2021-08-17 MED ORDER — PROPOFOL 10 MG/ML IV BOLUS
INTRAVENOUS | Status: AC
  Filled 2021-08-17: qty 80

## 2021-08-17 MED ORDER — ONDANSETRON HCL 4 MG/2ML IJ SOLN: 4.0000 mg | Freq: Once | INTRAMUSCULAR | Status: DC | PRN

## 2021-08-17 MED ORDER — SODIUM CHLORIDE FLUSH 0.9 % IV SOLN
INTRAVENOUS | Status: AC
  Filled 2021-08-17: qty 40

## 2021-08-17 MED ORDER — BUPIVACAINE LIPOSOME 1.3 % IJ SUSP
INTRAMUSCULAR | Status: AC
Start: 2021-08-17 — End: ?
  Filled 2021-08-17: qty 20

## 2021-08-17 MED ORDER — FENTANYL CITRATE (PF) 100 MCG/2ML IJ SOLN
INTRAMUSCULAR | Status: AC
  Filled 2021-08-17: qty 2

## 2021-08-17 MED ORDER — PHENYLEPHRINE HCL-NACL 20-0.9 MG/250ML-% IV SOLN
INTRAVENOUS | Status: DC | PRN
  Administered 2021-08-17: 40 ug/min via INTRAVENOUS

## 2021-08-17 MED ORDER — KETOROLAC TROMETHAMINE 15 MG/ML IJ SOLN
INTRAMUSCULAR | Status: AC
  Filled 2021-08-17: qty 1

## 2021-08-17 MED ORDER — FENTANYL CITRATE PF 50 MCG/ML IJ SOSY
PREFILLED_SYRINGE | INTRAMUSCULAR | Status: AC
  Administered 2021-08-17: 50 ug via INTRAVENOUS
  Filled 2021-08-17: qty 1

## 2021-08-17 MED ORDER — TRANEXAMIC ACID 1000 MG/10ML IV SOLN
INTRAVENOUS | Status: AC
  Filled 2021-08-17: qty 10

## 2021-08-17 MED ORDER — CHLORHEXIDINE GLUCONATE 0.12 % MT SOLN
OROMUCOSAL | Status: AC
  Administered 2021-08-17: 15 mL via OROMUCOSAL
  Filled 2021-08-17: qty 15

## 2021-08-17 MED ORDER — BUPIVACAINE HCL (PF) 0.5 % IJ SOLN
INTRAMUSCULAR | Status: DC | PRN
  Administered 2021-08-17: 10 mL via PERINEURAL

## 2021-08-17 MED ORDER — TRANEXAMIC ACID 1000 MG/10ML IV SOLN
INTRAVENOUS | Status: DC | PRN
  Administered 2021-08-17: 1000 mg via TOPICAL

## 2021-08-17 MED ORDER — PHENYLEPHRINE HCL (PRESSORS) 10 MG/ML IV SOLN
INTRAVENOUS | Status: DC | PRN
  Administered 2021-08-17: 160 ug via INTRAVENOUS

## 2021-08-17 MED ORDER — ONDANSETRON HCL 4 MG/2ML IJ SOLN
INTRAMUSCULAR | Status: DC | PRN
  Administered 2021-08-17: 4 mg via INTRAVENOUS

## 2021-08-17 MED ORDER — LIDOCAINE HCL (CARDIAC) PF 100 MG/5ML IV SOSY
PREFILLED_SYRINGE | INTRAVENOUS | Status: DC | PRN
  Administered 2021-08-17: 80 mg via INTRAVENOUS

## 2021-08-17 MED ORDER — CEFAZOLIN SODIUM-DEXTROSE 2-4 GM/100ML-% IV SOLN
INTRAVENOUS | Status: AC
  Administered 2021-08-17: 2 g via INTRAVENOUS
  Filled 2021-08-17: qty 100

## 2021-08-17 MED ORDER — KETOROLAC TROMETHAMINE 15 MG/ML IJ SOLN
15.0000 mg | Freq: Once | INTRAMUSCULAR | Status: AC
  Administered 2021-08-17: 15 mg via INTRAVENOUS

## 2021-08-17 MED ORDER — BUPIVACAINE LIPOSOME 1.3 % IJ SUSP
INTRAMUSCULAR | Status: AC
  Filled 2021-08-17: qty 10

## 2021-08-17 MED ORDER — BUPIVACAINE HCL (PF) 0.5 % IJ SOLN
INTRAMUSCULAR | Status: AC
  Filled 2021-08-17: qty 10

## 2021-08-17 MED ORDER — OXYBUTYNIN CHLORIDE ER 15 MG PO TB24: 15.0000 mg | ORAL_TABLET | ORAL | Status: DC

## 2021-08-17 MED ORDER — LACTATED RINGERS IV SOLN: INTRAVENOUS | Status: DC

## 2021-08-17 MED ORDER — ACETAMINOPHEN 10 MG/ML IV SOLN: 1000.0000 mg | Freq: Once | INTRAVENOUS | Status: DC | PRN

## 2021-08-17 MED ORDER — BUPIVACAINE LIPOSOME 1.3 % IJ SUSP
INTRAMUSCULAR | Status: DC | PRN
Start: 2021-08-17 — End: 2021-08-17
  Administered 2021-08-17: 10 mL via PERINEURAL

## 2021-08-17 MED ORDER — CEFAZOLIN SODIUM-DEXTROSE 2-4 GM/100ML-% IV SOLN
2.0000 g | INTRAVENOUS | Status: AC
  Administered 2021-08-17: 2 g via INTRAVENOUS

## 2021-08-17 MED ORDER — CHLORHEXIDINE GLUCONATE 0.12 % MT SOLN: 15.0000 mL | Freq: Once | OROMUCOSAL | Status: AC

## 2021-08-17 MED ORDER — OXYCODONE HCL 5 MG PO TABS: 5.0000 mg | ORAL_TABLET | ORAL | 0 refills | Status: DC | PRN

## 2021-08-17 MED ORDER — CEFAZOLIN SODIUM-DEXTROSE 2-4 GM/100ML-% IV SOLN
INTRAVENOUS | Status: AC
  Filled 2021-08-17: qty 100

## 2021-08-17 MED ORDER — FENTANYL CITRATE (PF) 100 MCG/2ML IJ SOLN
INTRAMUSCULAR | Status: DC | PRN
Start: 2021-08-17 — End: 2021-08-17
  Administered 2021-08-17: 25 ug via INTRAVENOUS

## 2021-08-17 MED ORDER — ACETAMINOPHEN 500 MG PO TABS: 1000.0000 mg | ORAL_TABLET | Freq: Four times a day (QID) | ORAL | Status: DC

## 2021-08-17 MED ORDER — FENTANYL CITRATE PF 50 MCG/ML IJ SOSY: 50.0000 ug | PREFILLED_SYRINGE | Freq: Once | INTRAMUSCULAR | Status: AC

## 2021-08-17 MED ORDER — SODIUM CHLORIDE (PF) 0.9 % IJ SOLN
INTRAMUSCULAR | Status: DC | PRN
  Administered 2021-08-17: 60 mL

## 2021-08-17 MED ORDER — 0.9 % SODIUM CHLORIDE (POUR BTL) OPTIME
TOPICAL | Status: DC | PRN
  Administered 2021-08-17: 500 mL

## 2021-08-17 SURGICAL SUPPLY — 69 items
APL PRP STRL LF DISP 70% ISPRP (MISCELLANEOUS) ×2
BASEPLATE BOSS DRILL (MISCELLANEOUS) ×1 IMPLANT
BIT DRILL 2.5 (BIT) ×2
BIT DRILL 2.5X4.5XSCR (BIT) IMPLANT
BIT DRILL BASEPLATE CENTRAL S (BIT) ×1 IMPLANT
BIT DRL 2.5X4.5XSCR (BIT) ×1
BLADE SAW SAG 25X90X1.19 (BLADE) ×2 IMPLANT
BSPLAT GLND THK2 22.8X27.3 (Plate) ×1 IMPLANT
CHLORAPREP W/TINT 26 (MISCELLANEOUS) ×3 IMPLANT
COOLER POLAR GLACIER W/PUMP (MISCELLANEOUS) ×2 IMPLANT
COVER BACK TABLE REUSABLE LG (DRAPES) ×2 IMPLANT
DRAPE 3/4 80X56 (DRAPES) ×2 IMPLANT
DRAPE INCISE IOBAN 66X45 STRL (DRAPES) ×2 IMPLANT
DRSG OPSITE POSTOP 4X8 (GAUZE/BANDAGES/DRESSINGS) ×2 IMPLANT
ELECT BLADE 6.5 EXT (BLADE) IMPLANT
ELECT CAUTERY BLADE 6.4 (BLADE) ×2 IMPLANT
ELECT REM PT RETURN 9FT ADLT (ELECTROSURGICAL) ×2
ELECTRODE REM PT RTRN 9FT ADLT (ELECTROSURGICAL) ×1 IMPLANT
GAUZE XEROFORM 1X8 LF (GAUZE/BANDAGES/DRESSINGS) ×2 IMPLANT
GLENOSPHERE RSS 2 CONCENTRIC (Shoulder) ×1 IMPLANT
GLOVE SRG 8 PF TXTR STRL LF DI (GLOVE) ×2 IMPLANT
GLOVE SURG ENC MOIS LTX SZ7.5 (GLOVE) ×8 IMPLANT
GLOVE SURG ENC MOIS LTX SZ8 (GLOVE) ×8 IMPLANT
GLOVE SURG UNDER LTX SZ8 (GLOVE) ×2 IMPLANT
GLOVE SURG UNDER POLY LF SZ8 (GLOVE) ×4
GOWN STRL REUS W/ TWL LRG LVL3 (GOWN DISPOSABLE) ×1 IMPLANT
GOWN STRL REUS W/ TWL XL LVL3 (GOWN DISPOSABLE) ×1 IMPLANT
GOWN STRL REUS W/TWL LRG LVL3 (GOWN DISPOSABLE) ×2
GOWN STRL REUS W/TWL XL LVL3 (GOWN DISPOSABLE) ×2
GUIDE PIN 2.0 X 150 (WIRE) ×2 IMPLANT
HOOD PEEL AWAY FLYTE STAYCOOL (MISCELLANEOUS) ×7 IMPLANT
IV NS IRRIG 3000ML ARTHROMATIC (IV SOLUTION) ×2 IMPLANT
KIT STABILIZATION SHOULDER (MISCELLANEOUS) ×2 IMPLANT
KIT TURNOVER KIT A (KITS) ×2 IMPLANT
LINER STD +0S RSS HXL (Liner) ×1 IMPLANT
MANIFOLD NEPTUNE II (INSTRUMENTS) ×2 IMPLANT
MASK FACE SPIDER DISP (MASK) ×2 IMPLANT
MAT ABSORB  FLUID 56X50 GRAY (MISCELLANEOUS) ×1
MAT ABSORB FLUID 56X50 GRAY (MISCELLANEOUS) ×1 IMPLANT
NDL MAYO CATGUT SZ1 (NEEDLE) IMPLANT
NDL SAFETY ECLIPSE 18X1.5 (NEEDLE) ×1 IMPLANT
NDL SPNL 20GX3.5 QUINCKE YW (NEEDLE) ×1 IMPLANT
NEEDLE HYPO 18GX1.5 SHARP (NEEDLE) ×2
NEEDLE MAYO CATGUT SZ1 (NEEDLE) IMPLANT
NEEDLE SPNL 20GX3.5 QUINCKE YW (NEEDLE) ×2 IMPLANT
NS IRRIG 500ML POUR BTL (IV SOLUTION) ×2 IMPLANT
PACK ARTHROSCOPY SHOULDER (MISCELLANEOUS) ×2 IMPLANT
PAD ARMBOARD 7.5X6 YLW CONV (MISCELLANEOUS) ×2 IMPLANT
PAD WRAPON POLAR SHDR UNIV (MISCELLANEOUS) ×1 IMPLANT
PLATE BASE REVERSE RSS S (Plate) ×1 IMPLANT
PULSAVAC PLUS IRRIG FAN TIP (DISPOSABLE) ×2
SCREW 4.5X15 RSS W CAP (Screw) ×4 IMPLANT
SCREW 4.5X25 RSS W CAP (Screw) ×1 IMPLANT
SCREW BODY REVERSE STD (Screw) ×1 IMPLANT
SLING ULTRA II M (MISCELLANEOUS) ×1 IMPLANT
SPONGE T-LAP 18X18 ~~LOC~~+RFID (SPONGE) ×4 IMPLANT
STAPLER SKIN PROX 35W (STAPLE) ×2 IMPLANT
STEM PRESS FIT SZ 10 TSS (Stem) ×1 IMPLANT
SUT ETHIBOND 0 MO6 C/R (SUTURE) ×2 IMPLANT
SUT FIBERWIRE #2 38 BLUE 1/2 (SUTURE) ×8
SUT VIC AB 0 CT1 36 (SUTURE) ×2 IMPLANT
SUT VIC AB 2-0 CT1 27 (SUTURE) ×4
SUT VIC AB 2-0 CT1 TAPERPNT 27 (SUTURE) ×2 IMPLANT
SUTURE FIBERWR #2 38 BLUE 1/2 (SUTURE) ×4 IMPLANT
SYR 10ML LL (SYRINGE) ×2 IMPLANT
SYR 30ML LL (SYRINGE) ×2 IMPLANT
TIP FAN IRRIG PULSAVAC PLUS (DISPOSABLE) ×1 IMPLANT
WATER STERILE IRR 500ML POUR (IV SOLUTION) ×2 IMPLANT
WRAPON POLAR PAD SHDR UNIV (MISCELLANEOUS) ×2

## 2021-08-17 NOTE — Discharge Instructions (Addendum)
Orthopedic discharge instructions: ?May shower with intact OpSite dressing once nerve block has worn off (around Friday).  ?Apply ice frequently to shoulder or use Polar Care device. ?Take ibuprofen 600 mg TID with meals for 5-7 days, then as necessary. (Toradol given @ 10am. May have ibuprofen @ 4pm) ?Take oxycodone as prescribed when needed.  ?May supplement with ES Tylenol if necessary. ?May resume Plavix on Wednesday, 08/18/2021. ?Keep shoulder immobilizer on at all times except may remove for bathing purposes. ?Follow-up in 10-14 days or as scheduled. ? ? ?AMBULATORY SURGERY  ?DISCHARGE INSTRUCTIONS ? ? ?The drugs that you were given will stay in your system until tomorrow so for the next 24 hours you should not: ? ?Drive an automobile ?Make any legal decisions ?Drink any alcoholic beverage ? ? ?You may resume regular meals tomorrow.  Today it is better to start with liquids and gradually work up to solid foods. ? ?You may eat anything you prefer, but it is better to start with liquids, then soup and crackers, and gradually work up to solid foods. ? ? ?Please notify your doctor immediately if you have any unusual bleeding, trouble breathing, redness and pain at the surgery site, drainage, fever, or pain not relieved by medication. ?  ?DO NOT REMOVE TEAL GREEN ARMBAND FOR 4 DAYS. YOU RECEIVED EXPAREL, WHICH IS A TIME RELEASED LOCAL ANESTHETIC AND YOU SHOULD NOT RECEIVE ANY OTHER TYPE OF LOCAL ANESTHETICS FOR THE NEXT 4 DAYS. ? ? ?POLAR CARE INFORMATION ? ?http://jones.com/ ? ?How to use [place]?  YouTube   [email or web address].[email or web address] ? ?OPERATING INSTRUCTIONS ? ?Start the product ?With dry hands, connect the transformer to the electrical connection located on the top of the cooler. Next, plug the transformer into an appropriate electrical outlet. The unit will automatically start running at this point. ? ?To stop the pump, disconnect electrical power.  ?Unplug to  stop the product when not in use. Unplugging the Polar Care unit turns it off. Always unplug immediately after use. Never leave it plugged in while unattended. Remove pad.  ?  ?FIRST ADD WATER TO FILL LINE, THEN ICE---Replace ice when existing ice is almost melted ? ?1 Discuss Treatment with your Licensed Health Care Practitioner and Use Only as Prescribed ?2 Apply Insulation Barrier & Cold Therapy Pad ?3 Check for Moisture ?4 Inspect Skin Regularly ? ?Tips and Armed forces technical officer Tips ?1. Use cubed or chunked ice for optimal performance. ?2. It is recommended to drain the Pad between uses. To drain the pad, hold the Pad upright with the hose pointed toward the ground. Depress the black plunger and allow water to drain out. ?3. You may disconnect the Pad from the unit without removing the pad from the affected area by depressing the silver tabs on the hose coupling and gently pulling the hoses apart. The Pad and unit will seal itself and will not leak. Note: Some dripping during release is normal. ?4. DO NOT RUN PUMP WITHOUT WATER! The pump in this unit is designed to run with water. Running the unit without water will cause permanent damage to the pump. ?5. Unplug unit before removing lid. ? ?TROUBLESHOOTING GUIDE ?Pump not running, Water not flowing to the pad, Pad is not getting cold ?1. Make sure the transformer is plugged into the wall outlet. ?2. Confirm that the ice and water are filled to the indicated levels. ?3. Make sure there are no kinks in the pad. ?4. Gently pull on the blue tube  to make sure the tube/pad junction is straight. ?5. Remove the pad from the treatment site and ll it while the pad is lying at; then reapply. ?6. Confirm that the pad couplings are securely attached to the unit. Listen for the double clicks (Figure 1) to confirm the pad couplings are securely attached. ? ?Leaks    Note: Some condensation on the lines, controller, and pads is unavoidable, especially in warmer climates. ?1.  If using a Breg Polar Care Cold Therapy unit with a detachable Cold Therapy Pad, and a leak exists (other than condensation on the lines) disconnect the pad couplings. Make sure the silver tabs on the couplings are depressed before reconnecting the pad to the pump hose; then confirm both sides of the coupling are properly clicked in. ?2. If the coupling continues to leak or a leak is detected in the pad itself, stop using it and call [facility] at [phone]. ? ?Cleaning ?After use, empty and dry the unit with a soft cloth. Warm water and mild detergent may be used occasionally to clean the pump and tubes. ? ?WARNING: The [place] can be cold enough to cause serious injury, including full skin necrosis. Follow these Operating Instructions, and carefully read the Product Insert (see pouch on side of unit) and the Cold Therapy Pad Fitting Instructions (provided with each Cold Therapy Pad) prior to use. ? ? ? ?SHOULDER SLING IMMOBILIZER  ? ?VIDEO ?Slingshot 2 Shoulder Brace Application - YouTube ---[email or web address]://www.willis-schwartz.biz/ ? ?INSTRUCTIONS ?While supporting the injured arm, slide the forearm into the sling. Wrap the adjustable shoulder strap around the neck and shoulders and attach the strap end to the sling using  the ?alligator strap tab.?  ?Adjust the shoulder strap to the required length. ?Position the shoulder pad behind the neck. ?To secure the shoulder pad location (optional), pull the shoulder strap away from the shoulder pad, unfold the hook material on the top of the pad, then press the shoulder strap back onto the hook material to secure the pad in place. ?Attach the closure strap across the open top of the sling. Position the strap so that it holds the arm securely in the sling. Next, attach the thumb strap to the open end of the sling between the thumb and fingers. ?After sling has been fit, it may be easily removed and reapplied using the quick release buckle on  shoulder strap. ?If a neutral pillow or 15? abduction pillow is included, place the pillow at the waistline. Attach the sling to the pillow, lining up hook material on the pillow with the loop on sling. Adjust the waist strap to fit.  ?If waist strap is too long, cut it to fit. Use the small piece of double sided hook material (located on top of the pillow) to secure the strap end. Place the double sided hook material on the inside of the cut strap end and secure it to the waist strap.     ?If no pillow is included, attach the waist strap to the sling and adjust to fit.   ? ?Washing Instructions: Straps and sling must be removed and cleaned regularly depending on your activity level and perspiration. Hand wash straps and sling in cold water with mild detergent, rinse, air dry ? ? ?  ? ?   ? ? ? ? ?

## 2021-08-17 NOTE — Transfer of Care (Signed)
Immediate Anesthesia Transfer of Care Note ? ?Patient: Kimberly Trevino ? ?Procedure(s) Performed: REVERSE SHOULDER ARTHROPLASTY WITH BICEPS TENODESIS (Right: Shoulder) ? ?Patient Location: PACU ? ?Anesthesia Type:GA combined with regional for post-op pain ? ?Level of Consciousness: awake, drowsy and patient cooperative ? ?Airway & Oxygen Therapy: Patient Spontanous Breathing and Patient connected to face mask ? ?Post-op Assessment: Report given to RN and Post -op Vital signs reviewed and stable ? ?Post vital signs: Reviewed and stable ? ?Last Vitals:  ?Vitals Value Taken Time  ?BP 150/59 08/17/21 1005  ?Temp    ?Pulse 63 08/17/21 1012  ?Resp 20 08/17/21 1012  ?SpO2 100 % 08/17/21 1012  ?Vitals shown include unvalidated device data. ? ?Last Pain:  ?Vitals:  ? 08/17/21 1005  ?TempSrc:   ?PainSc: Asleep  ?   ? ?  ? ?Complications: No notable events documented. ?

## 2021-08-17 NOTE — Anesthesia Postprocedure Evaluation (Signed)
Anesthesia Post Note ? ?Patient: Kimberly Trevino ? ?Procedure(s) Performed: REVERSE SHOULDER ARTHROPLASTY WITH BICEPS TENODESIS (Right: Shoulder) ? ?Patient location during evaluation: PACU ?Anesthesia Type: General ?Level of consciousness: awake and alert ?Pain management: pain level controlled ?Vital Signs Assessment: post-procedure vital signs reviewed and stable ?Respiratory status: spontaneous breathing, nonlabored ventilation, respiratory function stable and patient connected to nasal cannula oxygen ?Cardiovascular status: blood pressure returned to baseline and stable ?Postop Assessment: no apparent nausea or vomiting ?Anesthetic complications: no ? ? ?No notable events documented. ? ? ?Last Vitals:  ?Vitals:  ? 08/17/21 1045 08/17/21 1055  ?BP: (!) 126/57 (!) 124/57  ?Pulse: 63 (!) 58  ?Resp: 19 15  ?Temp:  (!) 36.1 ?C  ?SpO2: 96% 98%  ?  ?Last Pain:  ?Vitals:  ? 08/17/21 1055  ?TempSrc: Temporal  ?PainSc: 0-No pain  ? ? ?  ?  ?  ?  ?  ?  ? ?Arita Miss ? ? ? ? ?

## 2021-08-17 NOTE — Anesthesia Procedure Notes (Signed)
Anesthesia Regional Block: Interscalene brachial plexus block  ? ?Pre-Anesthetic Checklist: , timeout performed,  Correct Patient, Correct Site, Correct Laterality,  Correct Procedure, Correct Position, site marked,  Risks and benefits discussed,  Surgical consent,  Pre-op evaluation,  At surgeon's request and post-op pain management ? ?Laterality: Right ? ?Prep: chloraprep     ?  ?Needles:  ?Injection technique: Single-shot ? ?Needle Type: Echogenic Needle   ? ? ?Needle Length: 4cm  ?Needle Gauge: 25  ? ? ? ?Additional Needles: ? ? ?Procedures:,,,, ultrasound used (permanent image in chart),,    ?Narrative:  ?Injection made incrementally with aspirations every 5 mL. ? ?Performed by: Personally  ?Anesthesiologist: Glenwood Revoir, MD ? ?Additional Notes: ?Patient's chart reviewed and they were deemed appropriate candidate for procedure, at surgeon's request. Patient educated about risks, benefits, and alternatives of the block including but not limited to: temporary or permanent nerve damage, bleeding, infection, damage to surround tissues, pneumothorax, hemidiaphragmatic paralysis, unilateral Horner's syndrome, block failure, local anesthetic toxicity. Patient expressed understanding. A formal time-out was conducted consistent with institution rules. ? ?Monitors were applied, and minimal sedation used (see nursing record). The site was prepped with skin prep and allowed to dry, and sterile gloves were used. A high frequency linear ultrasound probe with probe cover was utilized throughout. C5-7 nerve roots located and appeared anatomically normal, local anesthetic injected around them, and echogenic block needle trajectory was monitored throughout. Aspiration performed every 5ml. Lung and blood vessels were avoided. All injections were performed without resistance and free of blood and paresthesias. The patient tolerated the procedure well. ? ?Injectate: 10ml exparel + 10ml 0.5% bupivacaine  ? ? ? ?

## 2021-08-17 NOTE — Anesthesia Preprocedure Evaluation (Signed)
Anesthesia Evaluation  ?Patient identified by MRN, date of birth, ID band ?Patient awake ? ? ? ?Reviewed: ?Allergy & Precautions, NPO status , Patient's Chart, lab work & pertinent test results ? ?History of Anesthesia Complications ?Negative for: history of anesthetic complications ? ?Airway ?Mallampati: II ? ?TM Distance: >3 FB ?Neck ROM: Full ? ? ? Dental ? ?(+) Edentulous Upper, Edentulous Lower ?  ?Pulmonary ?asthma ([place]) , neg sleep apnea, neg COPD, Patient abstained from smoking.Not current smoker,  ?  ?Pulmonary exam normal ?breath sounds clear to auscultation ? ? ? ? ? ? Cardiovascular ?Exercise Tolerance: Good ?METShypertension, + CAD  ?(-) Past MI Normal cardiovascular exam+ dysrhythmias Supra Ventricular Tachycardia  ?Rhythm:Regular Rate:Normal ? ?EKG  2021  SR  LAFB ? ?ECHO  2018 ?Study Conclusions  ? ?- Left ventricle: Systolic function was normal. The estimated  ???ejection fraction was in the range of 55% to 60%. Wall motion was  ???normal; there were no regional wall motion abnormalities.  ?- Left atrium: No evidence of thrombus in the atrial cavity or  ???appendage.  ?- Right atrium: No evidence of thrombus in the atrial cavity or  ???appendage.  ?- Atrial septum: No defect or patent foramen ovale was identified.  ???Echo contrast study showed no right-to-left atrial level shunt,  ???following an increase in RA pressure induced by provocative  ???maneuvers.  ?  ?Neuro/Psych ?PSYCHIATRIC DISORDERS Anxiety Depression PLAVIX ? ?TIA  ? GI/Hepatic ?GERD  Medicated and Controlled,(+)  ?  ? (-) substance abuse ? ,   ?Endo/Other  ?neg diabetesHypothyroidism  ? Renal/GU ?negative Renal ROS  ? ?  ?Musculoskeletal ? ?(+) Arthritis ,  ? Abdominal ?  ?Peds ? Hematology ? ?(+) Blood dyscrasia (PLAVIX  NONE X 1 WEEK), ,   ?Anesthesia Other Findings ?Past Medical History: ?04/11/2016: Acute ischemic left MCA stroke ([place]) ?    Comment:  a.) small LEFT frontal MCA territory  infarct ?No date: Anxiety ?No date: Arthritis ?No date: Asthma ?No date: B12 deficiency ?No date: Carotid stenosis, bilateral ?    Comment:  a.) Carotid doppler 04/10/2016: mild (1-49%) BILATERAL  ?             ICAs. ?No date: Chronic low back pain ?No date: Colon polyp ?2011: Coronary artery disease ?    Comment:  a.) LHC 2011: 50% mLAD; no intervention opting for med  ?             mgmt. b.) LHC 06/04/2020: 70% p-mLAD, 90% oD1, 70% RI,  ?             70-80% Cx marginal branch, 60% RCA; anatomy not ideally  ?             suited for PCI --> CVTS consult for CABG consideration  ?             recommended for refractory symptoms. ?No date: Depression ?No date: Diastolic dysfunction ?    Comment:  a.)  TTE 04/11/2016: EF 60%; no RWMAs; mild MR; mild LA  ?             dilation; G1DD. ?No date: Female cystocele ?No date: GERD (gastroesophageal reflux disease) ?No date: Hyperlipidemia ?No date: Hypertension ?No date: Hypothyroidism ?No date: Incontinence in female ?No date: Long term current use of antithrombotics/antiplatelets ?    Comment:  a.) DAPT therapy (ASA + clopidogrel) ?No date: OAB (overactive bladder) ?No date: Obesity ?No date: Paresthesias in left hand ?No date: PSVT (paroxysmal supraventricular tachycardia) ([place]) ?2010:  Tachycardia ?No date: TIA (transient ischemic attack) ?No date: Vitamin D deficiency ? Reproductive/Obstetrics ? ?  ? ? ? ? ? ? ? ? ? ? ? ? ? ?  ?  ? ? ? ? ? ? ? ? ?Anesthesia Physical ? ?Anesthesia Plan ? ?ASA: 3 ? ?Anesthesia Plan: General  ? ?Post-op Pain Management: Regional block*, Ofirmev IV (intra-op)* and Toradol IV (intra-op)*  ? ?Induction: Intravenous ? ?PONV Risk Score and Plan: 3 and Ondansetron, Dexamethasone and Treatment may vary due to age or medical condition ? ?Airway Management Planned: Oral ETT ? ?Additional Equipment: None ? ?Intra-op Plan:  ? ?Post-operative Plan: Extubation in OR ? ?Informed Consent: I have reviewed the patients History and Physical, chart, labs and  discussed the procedure including the risks, benefits and alternatives for the proposed anesthesia with the patient or authorized representative who has indicated his/her understanding and acceptance.  ? ? ? ?Dental Advisory Given ? ?Plan Discussed with: Anesthesiologist, CRNA and Surgeon ? ?Anesthesia Plan Comments: (Discussed risks of anesthesia with patient, including PONV, sore throat, lip/dental/eye damage. Rare risks discussed as well, such as cardiorespiratory and neurological sequelae, and allergic reactions. Discussed the role of CRNA in patient's perioperative care. Patient understands. ?Discussed r/b/a of interscalene block, including elective nature. Risks discussed: ?- Rare: bleeding, infection, nerve damage ?- shortness of breath from hemidiaphragmatic paralysis ?- unilateral horner's syndrome ?- poor/non-working blocks ?- reactions and toxicity to local anesthetic ?Patient understands and agrees. ?)  ? ? ? ? ? ? ?Anesthesia Quick Evaluation ? ?

## 2021-08-17 NOTE — Anesthesia Procedure Notes (Signed)
Procedure Name: Intubation ?Date/Time: 08/17/2021 7:37 AM ?Performed by: Lowry Bowl, CRNA ?Pre-anesthesia Checklist: Patient identified, Emergency Drugs available, Suction available and Patient being monitored ?Patient Re-evaluated:Patient Re-evaluated prior to induction ?Oxygen Delivery Method: Circle system utilized ?Preoxygenation: Pre-oxygenation with 100% oxygen ?Induction Type: IV induction ?Ventilation: Mask ventilation without difficulty ?Laryngoscope Size: 3 and McGraph ?Grade View: Grade I ?Tube type: Oral ?Tube size: 6.5 mm ?Number of attempts: 1 ?Airway Equipment and Method: Stylet and Video-laryngoscopy ?Placement Confirmation: ETT inserted through vocal cords under direct vision, positive ETCO2 and breath sounds checked- equal and bilateral ?Secured at: 20 cm ?Tube secured with: Tape ?Dental Injury: Teeth and Oropharynx as per pre-operative assessment  ? ? ? ? ?

## 2021-08-17 NOTE — H&P (Signed)
History of Present Illness:  ?Kimberly Trevino is a 76 y.o. female who presents today as a result of a referral from Vance Peper, [place], for right shoulder pain and weakness.  ? ?The patient's symptoms began several years ago and developed without any specific cause or injury. Her symptoms have worsened recently, causing her to see her rheumatologist, Dr Posey Pronto. He administered a steroid injection which provided temporary partial relief of her symptoms. However her symptoms recurred, so she was referred to orthopedics for further evaluation and treatment. She saw Vance Peper, PA, who diagnosed her with advanced generative joint disease, ordered a CT scan, then referred her to follow-up with me after the CT scan to discuss surgical options. The patient describes the symptoms as marked (major pain with significant limitations) and have the quality of being aching, miserable, nagging, stabbing, tender and throbbing. The pain is localized to the lateral arm/shoulder. These symptoms are aggravated with normal daily activities, with sleeping, at higher levels of activity, with overhead activity and reaching behind the back. She has tried acetaminophen and muscle relaxants with limited benefit. She has tried rest with no significant benefit. She has tried the above-noted injection which provided only temporary partial relief of her symptoms. The patient denies any neck pain, nor does she note any numbness or paresthesias down her arm to her hand. She is right-hand dominant. This complaint is not work related. She is a sports non-participant. ? ?Shoulder Surgical History:  ?The patient has had no shoulder surgery in the past. ? ?PMH/PSH/Family History/Social History/Meds/Allergies:  ?I have reviewed past medical, surgical, social and family history, medications and allergies as documented in the EMR. ? ?Current Outpatient Medications: ? *multivits w-ca,fe,other min oral 1 tab by mouth daily  ? acetaminophen (TYLENOL) 325 MG  tablet Take 650 mg by mouth every 4 (four) hours as needed for Pain.  ? albuterol 90 mcg/actuation inhaler Inhale 2 inhalations into the lungs every 4 (four) hours as needed for Wheezing or Shortness of Breath 1 Inhaler 5  ? ascorbic acid (VITAMIN C) 500 MG tablet Take 500 mg by mouth once daily.  ? aspirin 81 mg tablet 1 tab by mouth daily  ? clopidogreL (PLAVIX) 75 mg tablet TAKE 1 TABLET BY MOUTH ONCE DAILY 90 tablet 3  ? cyclobenzaprine (FLEXERIL) 5 MG tablet Take 1 tablet (5 mg total) by mouth at bedtime as needed for Muscle spasms 30 tablet 1  ? diltiazem (CARDIZEM CD) 240 MG CD capsule TAKE 1 CAPSULE BY MOUTH ONCE DAILY 90 capsule 1  ? estradioL (ESTRACE) 0.5 MG tablet Take 1 tablet by mouth once daily 30 tablet 0  ? famotidine (PEPCID) 40 MG tablet Take 40 mg by mouth once daily as needed  ? fluocinonide (LIDEX) 0.05 % cream Apply two times a day as needed 30 g 3  ? fluticasone propion-salmeteroL (ADVAIR DISKUS) 250-50 mcg/dose diskus inhaler Inhale 1 inhalation into the lungs every 12 (twelve) hours for 180 days 180 each 1  ? gabapentin (NEURONTIN) 100 MG capsule Take 1 capsule (100 mg total) by mouth 2 (two) times daily 60 capsule 5  ? isosorbide mononitrate (IMDUR) 30 MG ER tablet Take 1 tablet (30 mg total) by mouth once daily 90 tablet 4  ? levothyroxine (SYNTHROID) 25 MCG tablet Take 1 tablet (25 mcg total) by mouth once daily Take on an empty stomach with a glass of water at least 30-60 minutes before breakfast. 30 tablet 5  ? lisinopriL (ZESTRIL) 20 MG tablet Take 1 tablet (  20 mg total) by mouth once daily 30 tablet 11  ? loratadine (CLARITIN) 10 mg capsule Take 1 capsule (10 mg total) by mouth once daily 90 capsule 1  ? metoprolol succinate (TOPROL-XL) 100 MG XL tablet TAKE 1 TABLET BY MOUTH ONCE DAILY 90 tablet 3  ? mirabegron 50 mg Tb24 Take 50 mg by mouth once daily. 30 tablet 3  ? mupirocin (BACTROBAN) 2 % ointment Apply topically once daily 22 g 0  ? oxybutynin (DITROPAN-XL) 5 MG XL tablet Take  3 tablets (15 mg total) by mouth once daily  ? rOPINIRole (REQUIP) 0.5 MG tablet Take 1 tablet (0.5 mg total) by mouth nightly 30 tablet 5  ? rosuvastatin (CRESTOR) 40 MG tablet TAKE 1 TABLET BY MOUTH ONCE DAILY 90 tablet 3  ? sertraline (ZOLOFT) 50 MG tablet Take 1 tablet (50 mg total) by mouth once daily 90 tablet 3  ? ?Allergies:  ? Doxycycline Itching and Angioedema  ? Sulfa (Sulfonamide Antibiotics) Unknown  ? ?Past Medical History:  ? Acute CVA (cerebrovascular accident) (CMS-HCC) 04/11/2016  ? Allergy  ? Anxiety 09/14/2011  ? Arthritis  ? B12 deficiency  ? Carpal tunnel syndrome, bilateral  ? Chronic low back pain  ? Colon polyp  ? Coronary artery disease  ? Depression  ? History of cataract  ? Hyperlipidemia  ? Hypertension  ? Mild intermittent asthma  ? Obesity  ? SVT (supraventricular tachycardia) (CMS-HCC)  ? TIA (transient ischemic attack)  ? VITAMIN D DEFICIENCY  ? ?Past Surgical History:  ? COLONOSCOPY 03/12/2013 (Dr. Dorthy Cooler @ [facility], rpt 5 yrs per [facility])  ? COLONOSCOPY 05/21/2021 (Tubular adenoma/PHx CP/repeat in 7 years if benefits outweigh risks/CTL)  ? ADENOIDECTOMY  ? CARPAL TUNNEL RELEASE  ? CERVICAL FUSION  ? HYSTERECTOMY  ? Hysterectomy without oophorectomy  ? KNEE ARTHROSCOPY  ? Left knee arthroscopy  ? meniscus repair  ? TONSILLECTOMY  ? TUBAL LIGATION  ? ?Family History:  ? Alzheimer's disease Mother  ? Diabetes type II Mother  ? High blood pressure (Hypertension) Mother  ? Rheum arthritis Mother  ? Coronary Artery Disease Father (MI 67s smoker)  ? Diabetes type II Father  ? High blood pressure (Hypertension) Sister  ? Diabetes type II Sister  ? Arthritis Sister  ? Coronary Artery Disease Brother (died in 54s but also drugs)  ? High blood pressure (Hypertension) Son (CHF)  ? Diabetes type II Son  ? Stroke Maternal Grandmother  ? High blood pressure (Hypertension) Sister (CHF)  ? Arthritis Sister  ? Diabetes Other  ? Diabetes type II Son  ? High blood pressure (Hypertension)  Son  ? Diabetes type II Sister  ? High blood pressure (Hypertension) Sister  ? High blood pressure (Hypertension) Sister  ? High blood pressure (Hypertension) Sister  ? ?Social History:  ? ?Socioeconomic History:  ? Marital status: Legally Separated  ? Number of children: 3  ?Occupational History  ? Occupation: CNA  ?Tobacco Use  ? Smoking status: Never  ? Smokeless tobacco: Never  ?Vaping Use  ? Vaping Use: Never used  ?Substance and Sexual Activity  ? Alcohol use: Never  ? Drug use: No  ? Sexual activity: Not Currently  ?Partners: Male  ?Birth control/protection: Other-see comments  ? ?Review of Systems:  ?A comprehensive 14 point ROS was performed, reviewed, and the pertinent orthopaedic findings are documented in the HPI. ? ?Physical Exam:  ?Vitals:  ?07/26/21 1154  ?BP: 118/76  ?Weight: 72.2 kg (159 lb 3.2 oz)  ?  Height: 157.5 cm ('5\' 2"'$ )  ?PainSc: 5  ?PainLoc: Shoulder  ? ?General/Constitutional: The patient appears to be well-nourished, well-developed, and in no acute distress. ?Neuro/Psych: Normal mood and affect, oriented to person, place and time. ?Eyes: Non-icteric. Pupils are equal, round, and reactive to light, and exhibit synchronous movement. ?ENT: Unremarkable. ?Lymphatic: No palpable adenopathy. ?Respiratory: Lungs clear to auscultation, Normal chest excursion, No wheezes and Non-labored breathing ?Cardiovascular: Regular rate and rhythm. No murmurs. and No edema, swelling or tenderness, except as noted in detailed exam. ?Integumentary: No impressive skin lesions present, except as noted in detailed exam. ?Musculoskeletal: Unremarkable, except as noted in detailed exam. ? ?Right shoulder exam: ?SKIN: normal ?SWELLING: none ?WARMTH: none ?LYMPH NODES: no adenopathy palpable ?CREPITUS: none ?TENDERNESS: Mildly tender over anterolateral shoulder ?ROM (active):  ?Forward flexion: 90 degrees ?Abduction: 80 degrees ?Internal rotation: Right PSIS ?ROM (passive):  ?Forward flexion: 140 degrees ?Abduction: 130  degrees  ?ER/IR at 90 abd: 80 degrees / 55 degrees ? ?She describes moderate pain with all motions. ? ?STRENGTH: Forward flexion: 4/5 ?Abduction: 4/5 ?External rotation: 4-4+/5 ?Internal rotation: 4-4+/5

## 2021-08-17 NOTE — Evaluation (Signed)
Occupational Therapy Evaluation ?Patient Details ?Name: Kimberly Trevino ?MRN: [id number] ?DOB: [birth year] ?Today's Date: 08/17/2021 ? ? ?History of Present Illness pt is s/p R Reverse right total shoulder arthroplasty with biceps tenodesis.08/17/21 PMH significant for for CVA, carpal tunnel, chronic low back pain, CAD, hypertension, SVT  ? ?Clinical Impression ?  ?Chart reviewed, pt greeted in chair with daughter present who will be staying with her for at least 6 weeks while she recovers per daugther report. Typically, pt lives alone and is MOD I-I in all ADL/IADL.  Pt has orders for RUE to be immobilized and will be NWBing per MD. Pt requires MOD assist for application of polar care and sling/immobilizer. Pt and daugther instructed in polar care mgt, sling/immobilizer mgt, ROM exercises for RUE, RUE precautions, adaptive strategies for bathing/dressing/toileting/grooming, positioning and considerations for sleep, and home/routines modifications to maximize falls prevention, safety, and independence. Handout provided. Daughter then adjusted sling/immobilizer and polar care to improve comfort, optimize positioning, and to maximize skin integrity/safety with good carry over of education. Pt requiring CGA for mobility as she appears groggy from anesthesia however pt daughter demonstrates appropriate management of pt current status with pt continuing to improve in awareness throughout evaluation. MD notified of findings. Pt and daughter verbalized understanding of all education/training provided. Pt will benefit from skilled OT services to address these limitations and improve independence in daily tasks. Recommend further therapy pt surgeon recommendations.  ?   ? ?Recommendations for follow up therapy are one component of a multi-disciplinary discharge planning process, led by the attending physician.  Recommendations may be updated based on patient status, additional functional criteria and insurance authorization.   ? ?Follow Up Recommendations ? Follow physician's recommendations for discharge plan and follow up therapies  ?  ?Assistance Recommended at Discharge Intermittent Supervision/Assistance  ?Patient can return home with the following A little help with walking and/or transfers;A little help with bathing/dressing/bathroom;Assist for transportation ? ?  ?Functional Status Assessment ? Patient has had a recent decline in their functional status and demonstrates the ability to make significant improvements in function in a reasonable and predictable amount of time.  ?Equipment Recommendations ? None recommended by OT  ?  ?Recommendations for Other Services   ? ? ?  ?Precautions / Restrictions Precautions ?Precautions: Shoulder ?Type of Shoulder Precautions: rTSA; ?Precaution Comments: per orders "Please instruct in donning/doffing shoulder immobilizer and Polar Care device; instruct in hand, wrist, and elbow range of motion and isometric strength exercises" ?Restrictions ?Weight Bearing Restrictions: Yes ?RUE Weight Bearing: Non weight bearing  ? ?  ? ?Mobility Bed Mobility ?  ?  ?  ?  ?  ?  ?  ?General bed mobility comments: NT pt in recliner ?  ? ?Transfers ?Overall transfer level: Needs assistance ?  ?Transfers: Sit to/from Stand ?Sit to Stand: Min guard ?  ?  ?  ?  ?  ?  ?  ? ?  ?Balance Overall balance assessment: Needs assistance ?Sitting-balance support: Feet supported ?Sitting balance-Leahy Scale: Good ?  ?  ?Standing balance support: Single extremity supported ?Standing balance-Leahy Scale: Fair ?  ?  ?  ?  ?  ?  ?  ?  ?  ?  ?  ?  ?   ? ?ADL either performed or assessed with clinical judgement  ? ?ADL Overall ADL's : Needs assistance/impaired ?  ?  ?  ?  ?  ?  ?  ?  ?Upper Body Dressing : Moderate assistance;Sitting ?Upper Body Dressing Details (indicate  cue type and reason): open shirt and sling with daugther assisting demonstrating good carry over of skills ?  ?  ?Toilet Transfer: Min guard;Ambulation ?Toilet  Transfer Details (indicate cue type and reason): simulated ?  ?  ?  ?  ?Functional mobility during ADLs: Min guard ?   ? ? ? ?Vision Patient Visual Report: No change from baseline ?   ?   ?Perception   ?  ?Praxis   ?  ? ?Pertinent Vitals/Pain Pain Assessment ?Pain Assessment: 0-10 ?Pain Score: 2  ?Pain Location: R shoulder ?Pain Descriptors / Indicators: Aching ?Pain Intervention(s): Limited activity within patient's tolerance, Monitored during session, Repositioned, Ice applied  ? ? ? ?Hand Dominance Right ?  ?Extremity/Trunk Assessment Upper Extremity Assessment ?Upper Extremity Assessment: RUE deficits/detail ?RUE Deficits / Details: shoulder NT; R elbow 1/4 full AROM, R wrist 1/4 full AROM,  hand able to open/close; ?RUE Sensation: decreased light touch (decreased light touch to thumb, D1;) ?  ?Lower Extremity Assessment ?Lower Extremity Assessment: Overall WFL for tasks assessed ?  ?  ?  ?Communication Communication ?Communication: No difficulties ?  ?Cognition Arousal/Alertness: Awake/alert, Suspect due to medications ?Behavior During Therapy: [facility] for tasks assessed/performed ?Overall Cognitive Status: Within Functional Limits for tasks assessed ?  ?  ?  ?  ?  ?  ?  ?  ?  ?  ?  ?  ?  ?  ?  ?  ?General Comments: pt is alert and oriented x4, however appeaars groggy. Pt participates in evaluation approrpriately ?  ?  ?General Comments    ? ?  ?Exercises   ?  ?Shoulder Instructions    ? ? ?Home Living Family/patient expects to be discharged to:: Private residence ?Living Arrangements: Alone ?Available Help at Discharge: Family;Available 24 hours/day (daugther will stay with her for 6 weeks) ?Type of Home: Apartment ?Home Access: Level entry ?  ?  ?Home Layout: One level ?  ?  ?Bathroom Shower/Tub: Tub/shower unit ?  ?Bathroom Toilet: Standard ?  ?  ?Home Equipment: None ?  ?  ?  ? ?  ?Prior Functioning/Environment Prior Level of Function : Independent/Modified Independent ?  ?  ?  ?  ?  ?  ?Mobility Comments: MOD  I-I with no AD ?ADLs Comments: works as a [employer]arry manager; 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 ?  ? ?  ?  ?OT Problem List: Decreased activity tolerance;Decreased strength;Impaired balance (sitting and/or standing) ?  ?   ?OT Treatment/Interventions: Self-care/ADL training;Therapeutic exercise;Modalities;Patient/family education;Therapeutic activities;DME and/or AE instruction  ?  ?OT Goals(Current goals can be found in the care plan section) Acute Rehab OT Goals ?Patient Stated Goal: go home ?OT Goal Formulation: With patient/family ?Time For Goal Achievement: 08/31/21 ?Potential to Achieve Goals: Good ?ADL Goals ?Pt Will Perform Upper Body Dressing: with modified independence ?Pt Will Transfer to Toilet: with modified independence ?Pt Will Perform Toileting - Clothing Manipulation and hygiene: with modified independence  ?OT Frequency: Min 2X/week ?  ? ?Co-evaluation   ?  ?  ?  ?  ? ?  ?AM-PAC OT "6 Clicks" Daily Activity     ?Outcome Measure Help from another person eating meals?: None ?Help from another person taking care of personal grooming?: A Little ?Help from another person toileting, which includes using toliet, bedpan, or urinal?: A Little ?Help from another person bathing (including washing, rinsing, drying)?: A Little ?Help from another person to put on and taking off regular upper body clothing?: A Little ?Help from  another person to put on and taking off regular lower body clothing?: A Little ?6 Click Score: 19 ?  ?End of Session Equipment Utilized During Treatment: Gait belt ?Nurse Communication: Mobility status ? ?Activity Tolerance: Patient tolerated treatment well ?Patient left: in chair;with call bell/phone within reach;with family/visitor present ? ?OT Visit Diagnosis: Unsteadiness on feet (R26.81)  ?              ?Time: 1231-1300 ?OT Time Calculation (min): 29 min ?Charges:  OT General Charges ?$OT Visit: 1 Visit ?OT Evaluation ?$OT Eval Low Complexity: 1 Low ?OT Treatments ?$Self Care/Home  Management : 8-22 mins ? ?Shanon Payor, OTD OTR/L  ?08/17/21, 2:17 PM  ?

## 2021-08-17 NOTE — Op Note (Signed)
08/17/2021 ? ?9:56 AM ? ?Patient:   Kimberly Trevino ? ?Pre-Op Diagnosis:   Large chronic irreparable rotator cuff tear with early cuff arthropathy and biceps tendinopathy, right shoulder. ? ?Post-Op Diagnosis:   Same. ? ?Procedure:   Reverse right total shoulder arthroplasty with biceps tenodesis. ? ?Surgeon:   Pascal Lux, MD ? ?Assistant:   Cameron Proud, PA-C; Myra Rude, PA-S ? ?Anesthesia:   General endotracheal with an interscalene block using Exparel placed preoperatively by the anesthesiologist. ? ?Findings:   As above. ? ?Complications:   None ? ?EBL:   75 cc ? ?Fluids:   700 cc crystalloid ? ?UOP:   None ? ?TT:   None ? ?Drains:   None ? ?Closure:   Staples ? ?Implants:   All press-fit Integra system with a 10 mm stem, a standard metaphyseal body, a +0 mm humeral platform, a mini baseplate, and a 38 mm concentric +2 mm laterally offset glenosphere. ? ?Brief Clinical Note:   The patient is a 76 year old female with a long history of gradually worsening right shoulder pain and weakness. Her symptoms have progressed despite medications, activity modification, etc. Her history and examination are consistent with a large rotator cuff tear and early cuff arthropathy, all of which were confirmed by a preoperative MRI scan. The patient presents at this time for a reverse right total shoulder arthroplasty. ? ?Procedure:   The patient underwent placement of an interscalene block using Exparel by the anesthesiologist in the preoperative holding area before being brought into the operating room and lain in the supine position. The patient then underwent general endotracheal intubation and anesthesia before the patient was repositioned in the beach chair position using the beach chair positioner. The right shoulder and upper extremity were prepped with ChloraPrep solution before being draped sterilely. Preoperative antibiotics were administered.  ? ?A timeout was performed to verify the appropriate surgical  site before a standard anterior approach to the shoulder was made through an approximately 4-5 inch incision. The incision was carried down through the subcutaneous tissues to expose the deltopectoral fascia. The interval between the deltoid and pectoralis muscles was identified and this plane developed, retracting the cephalic vein laterally with the deltoid muscle. The conjoined tendon was identified. Its lateral margin was dissected and the Kolbel self-retraining retractor inserted. The "three sisters" were identified and cauterized. Bursal tissues were removed to improve visualization.  ? ?The biceps tendon was identified near the inferior aspect of the bicipital groove. A soft tissue tenodesis was performed by attaching the biceps tendon to the adjacent pectoralis major tendon using two #0 Ethibond interrupted sutures. The biceps tendon was then transected just proximal to the tenodesis site. The subscapularis tendon was released from its attachment to the lesser tuberosity 1 cm proximal to its insertion and several tagging sutures placed. The inferior capsule was released with care after identifying and protecting the axillary nerve. The proximal humeral cut was made at approximately 20? of retroversion using the extra-medullary guide.  ? ?Attention was redirected to the glenoid. The labrum was debrided circumferentially before the center of the glenoid was identified. The guidewire was drilled into the glenoid neck using the appropriate guide. After verifying its position, it was overreamed with the mini-baseplate reamer to create a flat surface before the stem reamer was utilized. The superior and inferior peg sites were reamed using the appropriate guide to complete the glenoid preparation. The permanent mini-baseplate was impacted into place. It was stabilized with a 15 x 4.5 mm central  screw and four peripheral screws. Locking caps were placed over the superior and inferior screws. The permanent 38 mm  concentric glenosphere with +2 mm of lateral offset was then impacted into place and its Morse taper locking mechanism verified using manual distraction. ? ?Attention was directed to the humeral side. The humeral canal was prepared utilizing the tapered stem reamers sequentially beginning with the 7 mm stem and progressing to a 10 mm stem. This demonstrated a good tight fit. The metaphyseal region was then prepared using the appropriate planar device. The trial stem and standard metaphyseal body were put together on the back table and a trial reduction performed using the +0 mm insert. With the +0 mm insert, the arm demonstrated excellent range of motion as the hand could be brought across the chest to the opposite shoulder and brought to the top of the patient's head and to the patient's ear. The shoulder appeared stable throughout this range of motion. The joint was dislocated and the trial components removed. The permanent 10 mm stem with the standard body was impacted into place with care taken to maintain the appropriate version. A repeat trial reduction with the +0 mm insert again demonstrated excellent stability with the findings as described above. Therefore, the shoulder was re-dislocated and, after inserting the locking screw to secure the body to the stem, the permanent +0 mm insert impacted into place. After verifying its locking mechanism, the shoulder was relocated using two finger pressure and again placed through a range of motion with the findings as described above. ? ?The wound was copiously irrigated with sterile saline solution using the jet lavage system before a total of 10 cc of Exparel diluted out to 60 cc with normal saline and 30 cc of 0.5% Sensorcaine with epinephrine was injected into the pericapsular and peri-incisional tissues to help with postoperative analgesia. The subscapularis tendon was reapproximated using #2 FiberWire interrupted sutures. The deltopectoral interval was closed  using #0 Vicryl interrupted sutures before the subcutaneous tissues were closed using 2-0 Vicryl interrupted sutures. The skin was closed using staples. Prior to closing the skin, 1 g of transexemic acid in 10 cc of normal saline was injected intra-articularly to help with postoperative bleeding. A sterile occlusive dressing was applied to the wound before the arm was placed into a shoulder immobilizer with an abduction pillow. A Polar Care system also was applied to the shoulder. The patient was then transferred back to a hospital bed before being awakened, extubated, and returned to the recovery room in satisfactory condition after tolerating the procedure well. ?

## 2021-08-18 ENCOUNTER — Encounter: Payer: Self-pay | Admitting: Surgery

## 2021-08-18 LAB — SURGICAL PATHOLOGY

## 2021-10-14 ENCOUNTER — Other Ambulatory Visit: Payer: Self-pay | Admitting: Internal Medicine

## 2021-10-14 ENCOUNTER — Ambulatory Visit
Admission: RE | Admit: 2021-10-14 | Discharge: 2021-10-14 | Disposition: A | Discharge status: Home / Self Care | Payer: Medicare Other | Source: Ambulatory Visit | Attending: Internal Medicine | Admitting: Internal Medicine

## 2021-10-14 DIAGNOSIS — M7989 Other specified soft tissue disorders: Secondary | ICD-10-CM

## 2021-10-14 DIAGNOSIS — M79662 Pain in left lower leg: Secondary | ICD-10-CM

## 2021-12-02 ENCOUNTER — Other Ambulatory Visit: Payer: Self-pay | Admitting: Internal Medicine

## 2021-12-02 DIAGNOSIS — Z1231 Encounter for screening mammogram for malignant neoplasm of breast: Secondary | ICD-10-CM

## 2021-12-22 ENCOUNTER — Ambulatory Visit
Admission: RE | Admit: 2021-12-22 | Discharge: 2021-12-22 | Disposition: A | Discharge status: Home / Self Care | Payer: Medicare Other | Source: Ambulatory Visit | Attending: Internal Medicine | Admitting: Internal Medicine

## 2021-12-22 DIAGNOSIS — Z1231 Encounter for screening mammogram for malignant neoplasm of breast: Secondary | ICD-10-CM | POA: Diagnosis present

## 2022-03-06 ENCOUNTER — Other Ambulatory Visit: Payer: Self-pay | Admitting: Urology

## 2022-03-06 DIAGNOSIS — N3946 Mixed incontinence: Secondary | ICD-10-CM

## 2022-03-09 ENCOUNTER — Other Ambulatory Visit: Payer: Self-pay | Admitting: Urology

## 2022-03-09 DIAGNOSIS — N3946 Mixed incontinence: Secondary | ICD-10-CM

## 2022-03-11 ENCOUNTER — Other Ambulatory Visit: Payer: Self-pay | Admitting: Urology

## 2022-03-11 DIAGNOSIS — N3946 Mixed incontinence: Secondary | ICD-10-CM

## 2022-03-15 ENCOUNTER — Other Ambulatory Visit: Payer: Self-pay | Admitting: Urology

## 2022-03-15 DIAGNOSIS — N3946 Mixed incontinence: Secondary | ICD-10-CM

## 2022-11-30 ENCOUNTER — Other Ambulatory Visit: Payer: Self-pay | Admitting: Internal Medicine

## 2022-11-30 DIAGNOSIS — Z1231 Encounter for screening mammogram for malignant neoplasm of breast: Secondary | ICD-10-CM

## 2022-12-22 ENCOUNTER — Other Ambulatory Visit: Payer: Self-pay | Admitting: Student

## 2022-12-22 DIAGNOSIS — Z96611 Presence of right artificial shoulder joint: Secondary | ICD-10-CM

## 2022-12-22 DIAGNOSIS — S42124A Nondisplaced fracture of acromial process, right shoulder, initial encounter for closed fracture: Secondary | ICD-10-CM

## 2022-12-22 DIAGNOSIS — Z9181 History of falling: Secondary | ICD-10-CM

## 2022-12-27 ENCOUNTER — Ambulatory Visit
Admission: RE | Admit: 2022-12-27 | Discharge: 2022-12-27 | Disposition: A | Discharge status: Home / Self Care | Payer: Medicare Other | Source: Ambulatory Visit | Attending: Student | Admitting: Student

## 2022-12-27 DIAGNOSIS — S42124A Nondisplaced fracture of acromial process, right shoulder, initial encounter for closed fracture: Secondary | ICD-10-CM

## 2022-12-27 DIAGNOSIS — Z9181 History of falling: Secondary | ICD-10-CM

## 2022-12-27 DIAGNOSIS — Z96611 Presence of right artificial shoulder joint: Secondary | ICD-10-CM

## 2022-12-28 ENCOUNTER — Ambulatory Visit
Admission: RE | Admit: 2022-12-28 | Discharge: 2022-12-28 | Disposition: A | Discharge status: Home / Self Care | Payer: Medicare Other | Source: Ambulatory Visit | Attending: Internal Medicine | Admitting: Internal Medicine

## 2022-12-28 DIAGNOSIS — Z1231 Encounter for screening mammogram for malignant neoplasm of breast: Secondary | ICD-10-CM | POA: Diagnosis not present

## 2023-03-13 NOTE — Progress Notes (Unsigned)
03/16/2023 3:24 PM   [name] June 03, 1945 [id number]  Referring provider: Barbette Reichmann, MD [address] [facility] [place],  [place] [address]  Urological history: 1. Mixed incontinence -contributing factors of obesity, vaginal delivery, a family history of incontinence, age, stroke, depression, fecal incontinence, vaginal atrophy, oral estrogens and pelvic surgery, taking antihistamines, OAB medication, ACE inhibitors and/or oral estrogen.   -cysto 2019 NED -failed anticholinergic therapy -failed PTNS -Myrbetriq 25 mg daily cost prohibitive -Oxybutynin XL 15 mg daily   2.rUTI's -contributing factors of age, vaginal atrophy, poor perineal hygiene,  -documented urine cultures over the last year  March 03, 2023 E. Coli  December 19, 2022 E. Coli  November 16, 2022 E. Coli  July 26, 2022 E. Coli               3. Pelvic floor laxity -Small high grade 2 cystocele and a distal grade 1 rectocele   Chief Complaint  Patient presents with   Follow-up   HPI: Kimberly Trevino is a 77 y.o. female who presents today for rUTI's.  Previous records reviewed.   She is having 1-7 daytime voids, 3 more episodes of nocturia with strong urge to urinate.   She is experiencing urge incontinence.  She wears 2 depends daily using 5-6 absorbent pads for reinforcement.  She does engage in fluid limitation and toilet mapping.  She has been on Ditropan XL 15 mg daily, but she states it has not been effective.  Patient denies any modifying or aggravating factors.  Patient denies any recent UTI's, gross hematuria, dysuria or suprapubic/flank pain.  Patient denies any fevers, chills, nausea or vomiting.    UA Unremarkable   PVR 0 mL  PMH: Past Medical History:  Diagnosis Date   Acute ischemic left MCA stroke (HCC) 04/11/2016   a.) small LEFT frontal MCA territory infarct   Anxiety    Arthritis    Asthma    B12 deficiency    Carotid stenosis, bilateral    a.)  Carotid doppler 04/10/2016: mild (1-49%) BILATERAL ICAs.   Chronic low back pain    Colon polyp    Coronary artery disease 2011   a.) LHC 2011: 50% mLAD; no intervention opting for med mgmt. b.) LHC 06/04/2020: 70% p-mLAD, 90% oD1, 70% RI, 70-80% Cx marginal branch, 60% RCA; anatomy not ideally suited for PCI --> CVTS consult for CABG consideration recommended for refractory symptoms.   Depression    Diastolic dysfunction    a.)  TTE 04/11/2016: EF 60%; no RWMAs; mild MR; mild LA dilation; G1DD.   Female cystocele    GERD (gastroesophageal reflux disease)    Hyperlipidemia    Hypertension    Hypothyroidism    Incontinence in female    Long term current use of antithrombotics/antiplatelets    a.) DAPT therapy (ASA + clopidogrel)   OAB (overactive bladder)    Obesity    Paresthesias in left hand    PSVT (paroxysmal supraventricular tachycardia) (HCC)    Tachycardia 2010   TIA (transient ischemic attack)    Vitamin D deficiency     Surgical History: Past Surgical History:  Procedure Laterality Date   ABDOMINAL HYSTERECTOMY     CARDIAC CATHETERIZATION  05/16/2009   50% to the mid LAD lesion   CARPAL TUNNEL RELEASE Bilateral    CERVICAL FUSION     COLONOSCOPY WITH PROPOFOL N/A 05/21/2021   Procedure: COLONOSCOPY WITH PROPOFOL;  Surgeon: Regis Bill, MD;  Location: [facility];  Service: Endoscopy;  Laterality: N/A;  PLAVIX   KNEE ARTHROSCOPY Left    PARTIAL HYSTERECTOMY  05/16/1997   REVERSE SHOULDER ARTHROPLASTY Right 08/17/2021   Procedure: REVERSE SHOULDER ARTHROPLASTY WITH BICEPS TENODESIS;  Surgeon: Christena Flake, MD;  Location: [facility] ORS;  Service: Orthopedics;  Laterality: Right;   TEE WITHOUT CARDIOVERSION N/A 06/13/2016   Procedure: TRANSESOPHAGEAL ECHOCARDIOGRAM (TEE);  Surgeon: Iran Ouch, MD;  Location: [facility] OR[facility];  Service: Cardiovascular;  Laterality: N/A;   TONSILLECTOMY     TUBAL LIGATION      Home Medications:  Allergies as of 03/16/2023        Reactions   Doxycycline    Angioedema + itching   Sulfa Antibiotics Hives   Welps        Medication List        Accurate as of March 16, 2023  3:24 PM. If you have any questions, ask your nurse or doctor.          STOP taking these medications    oxybutynin 15 MG 24 hr tablet Commonly known as: DITROPAN XL Stopped by: Carollee Herter Christasia Angeletti       TAKE these medications    acetaminophen 325 MG tablet Commonly known as: TYLENOL Take 2 tablets (650 mg total) by mouth every 6 (six) hours as needed for mild pain (or Fever >/= 101).   ascorbic acid 500 MG tablet Commonly known as: VITAMIN C Take 500 mg by mouth daily.   aspirin EC 81 MG tablet Take 1 tablet (81 mg total) by mouth daily.   Cartia XT 240 MG 24 hr capsule Generic drug: diltiazem Take 240 mg by mouth every evening.   cefdinir 300 MG capsule Commonly known as: OMNICEF Take 300 mg by mouth every 12 (twelve) hours.   clopidogrel 75 MG tablet Commonly known as: PLAVIX Take 1 tablet (75 mg total) by mouth daily.   Dilt-XR 240 MG 24 hr capsule Generic drug: diltiazem Take 1 capsule by mouth daily.   EQ Eye Allergy Relief 0.027-0.315 % Soln Generic drug: Naphazoline-Pheniramine Place 1 drop into both eyes daily as needed (eye allergies).   estradiol 0.1 MG/GM vaginal cream Commonly known as: ESTRACE VAGINAL Apply 0.5mg  (pea-sized amount)  just inside the vaginal introitus with a finger-tip on Monday, Wednesday and Friday nights. Started by: Michiel Cowboy   estradiol 0.5 MG tablet Commonly known as: ESTRACE Take 0.5 mg by mouth daily.   Euthyrox 25 MCG tablet Generic drug: levothyroxine Take 25 mcg by mouth daily before breakfast.   famotidine 40 MG tablet Commonly known as: PEPCID Take 40 mg by mouth every morning.   fluocinonide cream 0.05 % Commonly known as: LIDEX Apply 1 application. topically as needed (irritation).   fluticasone-salmeterol 250-50 MCG/ACT Aepb Commonly known as:  ADVAIR Inhale 1 puff into the lungs 2 (two) times daily as needed (asthma).   gabapentin 100 MG capsule Commonly known as: NEURONTIN Take 100 mg by mouth at bedtime.   Gemtesa 75 MG Tabs Generic drug: Vibegron Take 1 tablet (75 mg total) by mouth daily. Started by: Michiel Cowboy   isosorbide mononitrate 30 MG 24 hr tablet Commonly known as: IMDUR Take 30 mg by mouth every evening.   lisinopril 20 MG tablet Commonly known as: ZESTRIL Take 20 mg by mouth every evening.   loratadine 10 MG tablet Commonly known as: CLARITIN Take 10 mg by mouth every morning.   metaxalone 800 MG tablet Commonly known as: SKELAXIN Take 400-800 mg by mouth 3 (three)  times daily as needed for muscle spasms.   metoprolol succinate 100 MG 24 hr tablet Commonly known as: TOPROL-XL Take 100 mg by mouth every evening. Take with or immediately following a meal.   multivitamin tablet Take 1 tablet by mouth daily.   mupirocin ointment 2 % Commonly known as: BACTROBAN Apply 1 application. topically 3 (three) times daily as needed (wound care).   oxyCODONE 5 MG immediate release tablet Commonly known as: Roxicodone Take 1-2 tablets (5-10 mg total) by mouth every 4 (four) hours as needed for moderate pain or severe pain.   predniSONE 10 MG tablet Commonly known as: DELTASONE 3,3,,2,2,1,1, and stop   promethazine-dextromethorphan 6.25-15 MG/5ML syrup Commonly known as: PROMETHAZINE-DM Take by mouth.   rosuvastatin 40 MG tablet Commonly known as: CRESTOR Take 40 mg by mouth every evening.   sertraline 50 MG tablet Commonly known as: ZOLOFT Take 50 mg by mouth every evening.   Ventolin HFA 108 (90 Base) MCG/ACT inhaler Generic drug: albuterol Inhale 2 puffs into the lungs every 4 (four) hours as needed for wheezing or shortness of breath.        Allergies:  Allergies  Allergen Reactions   Doxycycline     Angioedema + itching    Sulfa Antibiotics Hives    Welps    Family  History: Family History  Problem Relation Age of Onset   Hypertension Mother    Heart attack Father 27       MI   Heart disease Father    Hypertension Father    Hypertension Sister    Hyperlipidemia Sister    Heart attack Brother        cardiac arrest   Hypertension Sister    Hyperlipidemia Sister    Breast cancer Neg Hx     Social History:  reports that she has never smoked. She has never used smokeless tobacco. She reports that she does not drink alcohol and does not use drugs.  ROS: Pertinent ROS in HPI  Physical Exam: BP 132/69   Pulse 77   Constitutional:  Well nourished. Alert and oriented, No acute distress. HEENT: [place] AT, moist mucus membranes.  Trachea midline Cardiovascular: No clubbing, cyanosis, or edema. Respiratory: Normal respiratory effort, no increased work of breathing. Neurologic: Grossly intact, no focal deficits, moving all 4 extremities. Psychiatric: Normal mood and affect.    Laboratory Data: CBC w/auto Differential (5 Part) Order: [id number] Component Ref Range & Units 1 mo ago  WBC (White Blood Cell Count) 4.1 - 10.2 10^3/uL 3.7 Low   RBC (Red Blood Cell Count) 4.04 - 5.48 10^6/uL 4.46  Hemoglobin 12.0 - 15.0 gm/dL 41.6  Hematocrit 60.6 - 47.0 % 37.5  MCV (Mean Corpuscular Volume) 80.0 - 100.0 fl 84.1  MCH (Mean Corpuscular Hemoglobin) 27.0 - 31.2 pg 29.4  MCHC (Mean Corpuscular Hemoglobin Concentration) 32.0 - 36.0 gm/dL 30.1  Platelet Count 601 - 450 10^3/uL 247  RDW-CV (Red Cell Distribution Width) 11.6 - 14.8 % 13.2  MPV (Mean Platelet Volume) 9.4 - 12.4 fl 9.8  Neutrophils 1.50 - 7.80 10^3/uL 1.79  Lymphocytes 1.00 - 3.60 10^3/uL 1.22  Monocytes 0.00 - 1.50 10^3/uL 0.43  Eosinophils 0.00 - 0.55 10^3/uL 0.28  Basophils 0.00 - 0.09 10^3/uL 0.01  Neutrophil % 32.0 - 70.0 % 48  Lymphocyte % 10.0 - 50.0 % 32.7  Monocyte % 4.0 - 13.0 % 11.5  Eosinophil % 1.0 - 5.0 % 7.5 High   Basophil% 0.0 - 2.0 % 0.3  Immature  Granulocyte % <=  0.7 % 0  Immature Granulocyte Count <=0.06 10^3/L 0  Resulting Agency [facility]   Specimen Collected: 01/13/23 07:42   Performed by: [facility] Last Resulted: 01/13/23 08:37  Received From: [facility]  Result Received: 03/06/23 08:53   Comprehensive Metabolic Panel (CMP) Order: [id number] Component Ref Range & Units 1 mo ago  Glucose 70 - 110 mg/dL 92  Sodium 660 - 630 mmol/L 138  Potassium 3.6 - 5.1 mmol/L 4  Chloride 97 - 109 mmol/L 102  Carbon Dioxide (CO2) 22.0 - 32.0 mmol/L 26.2  Urea Nitrogen (BUN) 7 - 25 mg/dL 14  Creatinine 0.6 - 1.1 mg/dL 0.8  Glomerular Filtration Rate (eGFR) >60 mL/min/1.73sq m 77  Comment: CKD-EPI (2021) does not include patient's race in the calculation of eGFR.  Monitoring changes of plasma creatinine and eGFR over time is useful for monitoring kidney function.  Interpretive Ranges for eGFR (CKD-EPI 2021):  eGFR:       >60 mL/min/1.73 sq. m - Normal eGFR:       30-59 mL/min/1.73 sq. m - Moderately Decreased eGFR:       15-29 mL/min/1.73 sq. m  - Severely Decreased eGFR:       < 15 mL/min/1.73 sq. m  - Kidney Failure   Note: These eGFR calculations do not apply in acute situations when eGFR is changing rapidly or patients on dialysis.  Calcium 8.7 - 10.3 mg/dL 9.7  AST 8 - 39 U/L 31  ALT 5 - 38 U/L 25  Alk Phos (alkaline Phosphatase) 34 - 104 U/L 60  Albumin 3.5 - 4.8 g/dL 4.6  Bilirubin, Total 0.3 - 1.2 mg/dL 0.8  Protein, Total 6.1 - 7.9 g/dL 6.9  A/G Ratio 1.0 - 5.0 gm/dL 2  Resulting Agency [facility]   Specimen Collected: 01/13/23 07:42   Performed by: [facility] Last Resulted: 01/13/23 10:21  Received From: [facility]  Result Received: 03/06/23 08:53    Hemoglobin A1C Order: [id number] Component Ref Range & Units 1 mo ago  Hemoglobin A1C 4.2 - 5.6 % 6.2 High   Average Blood Glucose (Calc) mg/dL  557  Resulting Agency [facility]  Narrative Performed by [facility] Normal Range:    4.2 - 5.6% Increased Risk:  5.7 - 6.4% Diabetes:        >= 6.5% Glycemic Control for adults with diabetes:  <7%    Specimen Collected: 01/13/23 07:42   Performed by: [facility] Last Resulted: 01/13/23 10:30  Received From: [facility]  Result Received: 03/06/23 08:53   Urinalysis See EPIC and HPI I have reviewed the labs.   Pertinent Imaging:  03/16/23 14:59  Scan Result 0 ml     Assessment & Plan:    1. rUTI's - criteria for recurrent UTI has been met with 2 or more infections in 6 months or 3 or greater infections in one year  -Today's UA is unremarkable -There seems to be some overlap with her UTI symptoms and her urge incontinence symptoms, some not quite sure if she is having recurrent UTIs or if she is just colonized -I will have her discontinue the Ditropan since it has not been effective and start Gemtesa 75 mg daily -Will evaluate again in 6 weeks   2. Vaginal atrophy  -I explained to the patient that when women go through menopause and her estrogen  levels are severely diminished, the normal vaginal flora will change.  This is due to an increase of the vaginal canal's pH. Because of this, the vaginal canal may be colonized by bacteria from the rectum instead of the protective lactobacillus.  This, accompanied by the loss of the mucus barrier with vaginal atrophy, is a cause of recurrent urinary tract infections. -In some studies, the use of vaginal estrogen cream has been demonstrated to reduce  recurrent urinary tract infections to one a year.  -Prescribed vaginal estrogen cream and instructed to apply 0.5mg  (pea-sized amount)  just inside the vaginal introitus with a finger-tip on Monday, Wednesday and Friday nights.  I explained to the patient that vaginally administered estrogen, which causes only a slight increase in  the blood estrogen levels, have fewer contraindications and adverse systemic effects that oral HT. -Follow-up in 3 months                                            Return in about 6 weeks (around 04/27/2023) for PVR and OAB questionnaire.  These notes generated with voice recognition software. I apologize for typographical errors.  Cloretta Ned  [facility] [address]  [address] [place], [place] [address] [phone]

## 2023-03-16 ENCOUNTER — Ambulatory Visit: Payer: Medicare Other | Admitting: Urology

## 2023-03-16 ENCOUNTER — Encounter: Payer: Self-pay | Admitting: Urology

## 2023-03-16 VITALS — BP 132/69 | HR 77

## 2023-03-16 DIAGNOSIS — N3941 Urge incontinence: Secondary | ICD-10-CM | POA: Diagnosis not present

## 2023-03-16 DIAGNOSIS — N390 Urinary tract infection, site not specified: Secondary | ICD-10-CM

## 2023-03-16 DIAGNOSIS — N952 Postmenopausal atrophic vaginitis: Secondary | ICD-10-CM | POA: Diagnosis not present

## 2023-03-16 DIAGNOSIS — Z87440 Personal history of urinary (tract) infections: Secondary | ICD-10-CM | POA: Diagnosis not present

## 2023-03-16 LAB — MICROSCOPIC EXAMINATION

## 2023-03-16 LAB — URINALYSIS, COMPLETE
Bilirubin, UA: NEGATIVE
Glucose, UA: NEGATIVE
Ketones, UA: NEGATIVE
Leukocytes,UA: NEGATIVE
Nitrite, UA: NEGATIVE
Protein,UA: NEGATIVE
RBC, UA: NEGATIVE
Specific Gravity, UA: >1.03 — ABNORMAL HIGH (ref 1.005–1.030)
Urobilinogen, Ur: 0.2 mg/dL (ref 0.2–1.0)
pH, UA: 5.5 (ref 5.0–7.5)

## 2023-03-16 LAB — BLADDER SCAN AMB NON-IMAGING: Scan Result: 0

## 2023-03-16 MED ORDER — GEMTESA 75 MG PO TABS: 75.0000 mg | ORAL_TABLET | Freq: Every day | ORAL | Status: DC

## 2023-03-16 MED ORDER — ESTRADIOL 0.1 MG/GM VA CREA: TOPICAL_CREAM | VAGINAL | 12 refills | Status: DC

## 2023-03-20 ENCOUNTER — Telehealth: Payer: Self-pay | Admitting: Urology

## 2023-03-20 NOTE — Telephone Encounter (Signed)
Patient called and lvm that she saw Michiel Cowboy on 03/16/23 and was given Gemtesa samples. She said that since she started taking this medication, her stomach has been hurting. She requests a call from [facility] or [facility], site to discuss.

## 2023-03-21 NOTE — Telephone Encounter (Signed)
Spoke with pt and advised her to stop taking medication until she hear back from [place]. Pt states her stomach is just aching, she did have a BM and it wasn't runny and after her stomach was still aching some.

## 2023-03-22 NOTE — Telephone Encounter (Signed)
Spoke with patient and advised results   

## 2023-04-24 NOTE — Progress Notes (Unsigned)
04/27/2023 4:51 PM   Kimberly Trevino 02-03-1946 [id number]  Referring provider: Barbette Reichmann, MD [address] [facility] [place],  [place] [address]  Urological history: 1. Mixed incontinence -contributing factors of obesity, vaginal delivery, a family history of incontinence, age, stroke, depression, fecal incontinence, vaginal atrophy, oral estrogens and pelvic surgery, taking antihistamines, OAB medication, ACE inhibitors and/or oral estrogen.   -cysto 2019 NED -failed anticholinergic therapy -failed PTNS -Myrbetriq 25 mg daily cost prohibitive -Oxybutynin XL 15 mg daily   2.rUTI's -contributing factors of age, vaginal atrophy, poor perineal hygiene,  -documented urine cultures over the last year  March 03, 2023 E. Coli  December 19, 2022 E. Coli  November 16, 2022 E. Coli  July 26, 2022 E. Coli               3. Pelvic floor laxity -Small high grade 2 cystocele and a distal grade 1 rectocele   No chief complaint on file.  HPI: Kimberly Trevino is a 77 y.o. female who presents today for follow up.   Previous records reviewed.   At her visit on 03/16/2023, she is having 1-7 daytime voids, 3 more episodes of nocturia with strong urge to urinate.   She is experiencing urge incontinence.  She wears 2 depends daily using 5-6 absorbent pads for reinforcement.  She does engage in fluid limitation and toilet mapping.  She has been on Ditropan XL 15 mg daily, but she states it has not been effective.  Patient denies any modifying or aggravating factors.  Patient denies any recent UTI's, gross hematuria, dysuria or suprapubic/flank pain.  Patient denies any fevers, chills, nausea or vomiting.   UA Unremarkable.  PVR 0 mL.  She was started on Gemtesa and vaginal estrogen cream.  PVR ***      PMH: Past Medical History:  Diagnosis Date   Acute ischemic left MCA stroke (HCC) 04/11/2016   a.) small LEFT frontal MCA territory infarct   Anxiety     Arthritis    Asthma    B12 deficiency    Carotid stenosis, bilateral    a.) Carotid doppler 04/10/2016: mild (1-49%) BILATERAL ICAs.   Chronic low back pain    Colon polyp    Coronary artery disease 2011   a.) LHC 2011: 50% mLAD; no intervention opting for med mgmt. b.) LHC 06/04/2020: 70% p-mLAD, 90% oD1, 70% RI, 70-80% Cx marginal branch, 60% RCA; anatomy not ideally suited for PCI --> CVTS consult for CABG consideration recommended for refractory symptoms.   Depression    Diastolic dysfunction    a.)  TTE 04/11/2016: EF 60%; no RWMAs; mild MR; mild LA dilation; G1DD.   Female cystocele    GERD (gastroesophageal reflux disease)    Hyperlipidemia    Hypertension    Hypothyroidism    Incontinence in female    Long term current use of antithrombotics/antiplatelets    a.) DAPT therapy (ASA + clopidogrel)   OAB (overactive bladder)    Obesity    Paresthesias in left hand    PSVT (paroxysmal supraventricular tachycardia) (HCC)    Tachycardia 2010   TIA (transient ischemic attack)    Vitamin D deficiency     Surgical History: Past Surgical History:  Procedure Laterality Date   ABDOMINAL HYSTERECTOMY     CARDIAC CATHETERIZATION  05/16/2009   50% to the mid LAD lesion   CARPAL TUNNEL RELEASE Bilateral    CERVICAL FUSION     COLONOSCOPY WITH PROPOFOL N/A  05/21/2021   Procedure: COLONOSCOPY WITH PROPOFOL;  Surgeon: Regis Bill, MD;  Location: [facility];  Service: Endoscopy;  Laterality: N/A;  PLAVIX   KNEE ARTHROSCOPY Left    PARTIAL HYSTERECTOMY  05/16/1997   REVERSE SHOULDER ARTHROPLASTY Right 08/17/2021   Procedure: REVERSE SHOULDER ARTHROPLASTY WITH BICEPS TENODESIS;  Surgeon: Christena Flake, MD;  Location: [facility];  Service: Orthopedics;  Laterality: Right;   TEE WITHOUT CARDIOVERSION N/A 06/13/2016   Procedure: TRANSESOPHAGEAL ECHOCARDIOGRAM (TEE);  Surgeon: Iran Ouch, MD;  Location: [facility] OR[facility];  Service: Cardiovascular;  Laterality: N/A;   TONSILLECTOMY      TUBAL LIGATION      Home Medications:  Allergies as of 04/27/2023       Reactions   Doxycycline    Angioedema + itching   Sulfa Antibiotics Hives   Welps        Medication List        Accurate as of April 24, 2023  4:51 PM. If you have any questions, ask your nurse or doctor.          acetaminophen 325 MG tablet Commonly known as: TYLENOL Take 2 tablets (650 mg total) by mouth every 6 (six) hours as needed for mild pain (or Fever >/= 101).   ascorbic acid 500 MG tablet Commonly known as: VITAMIN C Take 500 mg by mouth daily.   aspirin EC 81 MG tablet Take 1 tablet (81 mg total) by mouth daily.   Cartia XT 240 MG 24 hr capsule Generic drug: diltiazem Take 240 mg by mouth every evening.   cefdinir 300 MG capsule Commonly known as: OMNICEF Take 300 mg by mouth every 12 (twelve) hours.   clopidogrel 75 MG tablet Commonly known as: PLAVIX Take 1 tablet (75 mg total) by mouth daily.   Dilt-XR 240 MG 24 hr capsule Generic drug: diltiazem Take 1 capsule by mouth daily.   EQ Eye Allergy Relief 0.027-0.315 % Soln Generic drug: Naphazoline-Pheniramine Place 1 drop into both eyes daily as needed (eye allergies).   estradiol 0.1 MG/GM vaginal cream Commonly known as: ESTRACE VAGINAL Apply 0.5mg  (pea-sized amount)  just inside the vaginal introitus with a finger-tip on Monday, Wednesday and Friday nights.   estradiol 0.5 MG tablet Commonly known as: ESTRACE Take 0.5 mg by mouth daily.   Euthyrox 25 MCG tablet Generic drug: levothyroxine Take 25 mcg by mouth daily before breakfast.   famotidine 40 MG tablet Commonly known as: PEPCID Take 40 mg by mouth every morning.   fluocinonide cream 0.05 % Commonly known as: LIDEX Apply 1 application. topically as needed (irritation).   fluticasone-salmeterol 250-50 MCG/ACT Aepb Commonly known as: ADVAIR Inhale 1 puff into the lungs 2 (two) times daily as needed (asthma).   gabapentin 100 MG capsule Commonly  known as: NEURONTIN Take 100 mg by mouth at bedtime.   Gemtesa 75 MG Tabs Generic drug: Vibegron Take 1 tablet (75 mg total) by mouth daily.   isosorbide mononitrate 30 MG 24 hr tablet Commonly known as: IMDUR Take 30 mg by mouth every evening.   lisinopril 20 MG tablet Commonly known as: ZESTRIL Take 20 mg by mouth every evening.   loratadine 10 MG tablet Commonly known as: CLARITIN Take 10 mg by mouth every morning.   metaxalone 800 MG tablet Commonly known as: SKELAXIN Take 400-800 mg by mouth 3 (three) times daily as needed for muscle spasms.   metoprolol succinate 100 MG 24 hr tablet Commonly known as: TOPROL-XL Take 100 mg by  mouth every evening. Take with or immediately following a meal.   multivitamin tablet Take 1 tablet by mouth daily.   mupirocin ointment 2 % Commonly known as: BACTROBAN Apply 1 application. topically 3 (three) times daily as needed (wound care).   oxyCODONE 5 MG immediate release tablet Commonly known as: Roxicodone Take 1-2 tablets (5-10 mg total) by mouth every 4 (four) hours as needed for moderate pain or severe pain.   predniSONE 10 MG tablet Commonly known as: DELTASONE 3,3,,2,2,1,1, and stop   promethazine-dextromethorphan 6.25-15 MG/5ML syrup Commonly known as: PROMETHAZINE-DM Take by mouth.   rosuvastatin 40 MG tablet Commonly known as: CRESTOR Take 40 mg by mouth every evening.   sertraline 50 MG tablet Commonly known as: ZOLOFT Take 50 mg by mouth every evening.   Ventolin HFA 108 (90 Base) MCG/ACT inhaler Generic drug: albuterol Inhale 2 puffs into the lungs every 4 (four) hours as needed for wheezing or shortness of breath.        Allergies:  Allergies  Allergen Reactions   Doxycycline     Angioedema + itching    Sulfa Antibiotics Hives    Welps    Family History: Family History  Problem Relation Age of Onset   Hypertension Mother    Heart attack Father 37       MI   Heart disease Father     Hypertension Father    Hypertension Sister    Hyperlipidemia Sister    Heart attack Brother        cardiac arrest   Hypertension Sister    Hyperlipidemia Sister    Breast cancer Neg Hx     Social History:  reports that she has never smoked. She has never used smokeless tobacco. She reports that she does not drink alcohol and does not use drugs.  ROS: Pertinent ROS in HPI  Physical Exam: There were no vitals taken for this visit.  Constitutional:  Well nourished. Alert and oriented, No acute distress. HEENT: [place] AT, moist mucus membranes.  Trachea midline, no masses. Cardiovascular: No clubbing, cyanosis, or edema. Respiratory: Normal respiratory effort, no increased work of breathing. GU: No CVA tenderness.  No bladder fullness or masses.  Recession of labia minora, dry, pale vulvar vaginal mucosa and loss of mucosal ridges and folds.  Normal urethral meatus, no lesions, no prolapse, no discharge.   No urethral masses, tenderness and/or tenderness. No bladder fullness, tenderness or masses. *** vagina mucosa, *** estrogen effect, no discharge, no lesions, *** pelvic support, *** cystocele and *** rectocele noted.  No cervical motion tenderness.  Uterus is freely mobile and non-fixed.  No adnexal/parametria masses or tenderness noted.  Anus and perineum are without rashes or lesions.   ***  Neurologic: Grossly intact, no focal deficits, moving all 4 extremities. Psychiatric: Normal mood and affect.    Laboratory Data: N/A   Pertinent Imaging: ***   Assessment & Plan:    1. Urge incontinence ***  2. rUTI's - criteria for recurrent UTI has been met with 2 or more infections in 6 months or 3 or greater infections in one year  -Today's UA is unremarkable -There seems to be some overlap with her UTI symptoms and her urge incontinence symptoms, some not quite sure if she is having recurrent UTIs or if she is just colonized -I will have her discontinue the Ditropan since it has not  been effective and start Gemtesa 75 mg daily -Will evaluate again in 6 weeks   3. Vaginal atrophy  -  I explained to the patient that when women go through menopause and her estrogen levels are severely diminished, the normal vaginal flora will change.  This is due to an increase of the vaginal canal's pH. Because of this, the vaginal canal may be colonized by bacteria from the rectum instead of the protective lactobacillus.  This, accompanied by the loss of the mucus barrier with vaginal atrophy, is a cause of recurrent urinary tract infections. -In some studies, the use of vaginal estrogen cream has been demonstrated to reduce  recurrent urinary tract infections to one a year.  -Prescribed vaginal estrogen cream and instructed to apply 0.5mg  (pea-sized amount)  just inside the vaginal introitus with a finger-tip on Monday, Wednesday and Friday nights.  I explained to the patient that vaginally administered estrogen, which causes only a slight increase in the blood estrogen levels, have fewer contraindications and adverse systemic effects that oral HT. -Follow-up in 3 months                                            No follow-ups on file.  These notes generated with voice recognition software. I apologize for typographical errors.  Cloretta Ned  [facility] [address]  [address] [place], [place] [address] [phone]

## 2023-04-27 ENCOUNTER — Ambulatory Visit: Payer: Medicare Other | Admitting: Urology

## 2023-04-27 ENCOUNTER — Encounter: Payer: Self-pay | Admitting: Urology

## 2023-04-27 VITALS — BP 148/63 | HR 65 | Ht 62.0 in | Wt 166.0 lb

## 2023-04-27 DIAGNOSIS — N3941 Urge incontinence: Secondary | ICD-10-CM

## 2023-04-27 DIAGNOSIS — N8111 Cystocele, midline: Secondary | ICD-10-CM

## 2023-04-27 DIAGNOSIS — Z87440 Personal history of urinary (tract) infections: Secondary | ICD-10-CM

## 2023-04-27 DIAGNOSIS — N952 Postmenopausal atrophic vaginitis: Secondary | ICD-10-CM

## 2023-04-27 DIAGNOSIS — N3946 Mixed incontinence: Secondary | ICD-10-CM

## 2023-04-27 LAB — BLADDER SCAN AMB NON-IMAGING

## 2023-06-01 ENCOUNTER — Other Ambulatory Visit: Payer: Self-pay

## 2023-06-01 ENCOUNTER — Emergency Department: Payer: Medicare Other

## 2023-06-01 ENCOUNTER — Inpatient Hospital Stay: Payer: Medicare Other

## 2023-06-01 ENCOUNTER — Observation Stay
Admission: EM | Admit: 2023-06-01 | Discharge: 2023-06-02 | Disposition: A | Discharge status: Home / Self Care | Payer: Medicare Other | Attending: Obstetrics and Gynecology | Admitting: Obstetrics and Gynecology

## 2023-06-01 ENCOUNTER — Encounter: Payer: Self-pay | Admitting: Emergency Medicine

## 2023-06-01 DIAGNOSIS — Z8673 Personal history of transient ischemic attack (TIA), and cerebral infarction without residual deficits: Secondary | ICD-10-CM | POA: Diagnosis not present

## 2023-06-01 DIAGNOSIS — I471 Supraventricular tachycardia, unspecified: Secondary | ICD-10-CM | POA: Diagnosis present

## 2023-06-01 DIAGNOSIS — R29898 Other symptoms and signs involving the musculoskeletal system: Secondary | ICD-10-CM | POA: Diagnosis present

## 2023-06-01 DIAGNOSIS — I10 Essential (primary) hypertension: Secondary | ICD-10-CM | POA: Diagnosis present

## 2023-06-01 DIAGNOSIS — I5032 Chronic diastolic (congestive) heart failure: Secondary | ICD-10-CM | POA: Insufficient documentation

## 2023-06-01 DIAGNOSIS — J45909 Unspecified asthma, uncomplicated: Secondary | ICD-10-CM | POA: Diagnosis not present

## 2023-06-01 DIAGNOSIS — E039 Hypothyroidism, unspecified: Secondary | ICD-10-CM | POA: Diagnosis not present

## 2023-06-01 DIAGNOSIS — G459 Transient cerebral ischemic attack, unspecified: Principal | ICD-10-CM | POA: Diagnosis present

## 2023-06-01 DIAGNOSIS — R7309 Other abnormal glucose: Secondary | ICD-10-CM | POA: Insufficient documentation

## 2023-06-01 DIAGNOSIS — E785 Hyperlipidemia, unspecified: Secondary | ICD-10-CM | POA: Diagnosis present

## 2023-06-01 DIAGNOSIS — I2510 Atherosclerotic heart disease of native coronary artery without angina pectoris: Secondary | ICD-10-CM | POA: Diagnosis not present

## 2023-06-01 DIAGNOSIS — Z7982 Long term (current) use of aspirin: Secondary | ICD-10-CM | POA: Diagnosis not present

## 2023-06-01 DIAGNOSIS — I110 Hypertensive heart disease with heart failure: Secondary | ICD-10-CM | POA: Diagnosis not present

## 2023-06-01 DIAGNOSIS — Z7902 Long term (current) use of antithrombotics/antiplatelets: Secondary | ICD-10-CM | POA: Diagnosis not present

## 2023-06-01 DIAGNOSIS — R42 Dizziness and giddiness: Secondary | ICD-10-CM | POA: Diagnosis not present

## 2023-06-01 DIAGNOSIS — I639 Cerebral infarction, unspecified: Secondary | ICD-10-CM | POA: Diagnosis present

## 2023-06-01 DIAGNOSIS — I6529 Occlusion and stenosis of unspecified carotid artery: Secondary | ICD-10-CM | POA: Insufficient documentation

## 2023-06-01 LAB — CBC
HCT: 41.7 % (ref 36.0–46.0)
Hemoglobin: 14.5 g/dL (ref 12.0–15.0)
MCH: 29.8 pg (ref 26.0–34.0)
MCHC: 34.8 g/dL (ref 30.0–36.0)
MCV: 85.6 fL (ref 80.0–100.0)
Platelets: 251 10*3/uL (ref 150–400)
RBC: 4.87 MIL/uL (ref 3.87–5.11)
RDW: 13.2 % (ref 11.5–15.5)
WBC: 3.9 10*3/uL — ABNORMAL LOW (ref 4.0–10.5)
nRBC: 0 % (ref 0.0–0.2)

## 2023-06-01 LAB — HEMOGLOBIN A1C
Hgb A1c MFr Bld: 5.8 % — ABNORMAL HIGH (ref 4.8–5.6)
Mean Plasma Glucose: 119.76 mg/dL

## 2023-06-01 LAB — COMPREHENSIVE METABOLIC PANEL
ALT: 39 U/L (ref 0–44)
AST: 50 U/L — ABNORMAL HIGH (ref 15–41)
Albumin: 4.4 g/dL (ref 3.5–5.0)
Alkaline Phosphatase: 64 U/L (ref 38–126)
Anion gap: 10 (ref 5–15)
BUN: 15 mg/dL (ref 8–23)
CO2: 24 mmol/L (ref 22–32)
Calcium: 9.5 mg/dL (ref 8.9–10.3)
Chloride: 102 mmol/L (ref 98–111)
Creatinine, Ser: 0.9 mg/dL (ref 0.44–1.00)
GFR, Estimated: >60 mL/min (ref >60)
Glucose, Bld: 109 mg/dL — ABNORMAL HIGH (ref 70–99)
Potassium: 3.8 mmol/L (ref 3.5–5.1)
Sodium: 136 mmol/L (ref 135–145)
Total Bilirubin: 1 mg/dL (ref 0.0–1.2)
Total Protein: 7.6 g/dL (ref 6.5–8.1)

## 2023-06-01 LAB — ETHANOL: Alcohol, Ethyl (B): <10 mg/dL (ref <10)

## 2023-06-01 LAB — DIFFERENTIAL
Abs Immature Granulocytes: 0.01 10*3/uL (ref 0.00–0.07)
Basophils Absolute: 0 10*3/uL (ref 0.0–0.1)
Basophils Relative: 1 %
Eosinophils Absolute: 0.2 10*3/uL (ref 0.0–0.5)
Eosinophils Relative: 5 %
Immature Granulocytes: 0 %
Lymphocytes Relative: 32 %
Lymphs Abs: 1.2 10*3/uL (ref 0.7–4.0)
Monocytes Absolute: 0.4 10*3/uL (ref 0.1–1.0)
Monocytes Relative: 10 %
Neutro Abs: 2.1 10*3/uL (ref 1.7–7.7)
Neutrophils Relative %: 52 %

## 2023-06-01 LAB — URINE DRUG SCREEN, QUALITATIVE (ARMC ONLY)
Amphetamines, Ur Screen: NOT DETECTED
Barbiturates, Ur Screen: NOT DETECTED
Benzodiazepine, Ur Scrn: NOT DETECTED
Cannabinoid 50 Ng, Ur ~~LOC~~: NOT DETECTED
Cocaine Metabolite,Ur ~~LOC~~: NOT DETECTED
MDMA (Ecstasy)Ur Screen: NOT DETECTED
Methadone Scn, Ur: NOT DETECTED
Opiate, Ur Screen: NOT DETECTED
Phencyclidine (PCP) Ur S: NOT DETECTED
Tricyclic, Ur Screen: NOT DETECTED

## 2023-06-01 LAB — URINALYSIS, ROUTINE W REFLEX MICROSCOPIC
Bilirubin Urine: NEGATIVE
Glucose, UA: NEGATIVE mg/dL
Hgb urine dipstick: NEGATIVE
Ketones, ur: NEGATIVE mg/dL
Leukocytes,Ua: NEGATIVE
Nitrite: NEGATIVE
Protein, ur: NEGATIVE mg/dL
Specific Gravity, Urine: 1.016 (ref 1.005–1.030)
pH: 6 (ref 5.0–8.0)

## 2023-06-01 LAB — PROTIME-INR
INR: 1 (ref 0.8–1.2)
Prothrombin Time: 13 s (ref 11.4–15.2)

## 2023-06-01 LAB — CBG MONITORING, ED: Glucose-Capillary: 99 mg/dL (ref 70–99)

## 2023-06-01 LAB — APTT: aPTT: 25 s (ref 24–36)

## 2023-06-01 MED ORDER — IOHEXOL 350 MG/ML SOLN
75.0000 mL | Freq: Once | INTRAVENOUS | Status: AC | PRN
  Administered 2023-06-01: 75 mL via INTRAVENOUS

## 2023-06-01 MED ORDER — GABAPENTIN 300 MG PO CAPS
300.0000 mg | ORAL_CAPSULE | Freq: Every day | ORAL | Status: DC
Start: 2023-06-01 — End: 2023-06-02
  Administered 2023-06-01: 300 mg via ORAL
  Filled 2023-06-01: qty 1

## 2023-06-01 MED ORDER — ACETAMINOPHEN 325 MG PO TABS: 650.0000 mg | ORAL_TABLET | ORAL | Status: DC | PRN

## 2023-06-01 MED ORDER — SENNOSIDES-DOCUSATE SODIUM 8.6-50 MG PO TABS: 1.0000 | ORAL_TABLET | Freq: Every evening | ORAL | Status: DC | PRN

## 2023-06-01 MED ORDER — SODIUM CHLORIDE 0.9% FLUSH: 3.0000 mL | Freq: Once | INTRAVENOUS | Status: DC

## 2023-06-01 MED ORDER — LORATADINE 10 MG PO TABS
10.0000 mg | ORAL_TABLET | ORAL | Status: DC
  Administered 2023-06-02: 10 mg via ORAL
  Filled 2023-06-01: qty 1

## 2023-06-01 MED ORDER — ASPIRIN 81 MG PO TBEC
81.0000 mg | DELAYED_RELEASE_TABLET | Freq: Every day | ORAL | Status: DC
Start: 2023-06-02 — End: 2023-06-02
  Administered 2023-06-02: 81 mg via ORAL
  Filled 2023-06-01: qty 1

## 2023-06-01 MED ORDER — CLOPIDOGREL BISULFATE 75 MG PO TABS
75.0000 mg | ORAL_TABLET | Freq: Every day | ORAL | Status: DC
Start: 2023-06-02 — End: 2023-06-02
  Administered 2023-06-02: 75 mg via ORAL
  Filled 2023-06-01: qty 1

## 2023-06-01 MED ORDER — LORAZEPAM 1 MG PO TABS
0.5000 mg | ORAL_TABLET | ORAL | Status: AC
  Administered 2023-06-01: 0.5 mg via ORAL
  Filled 2023-06-01: qty 1

## 2023-06-01 MED ORDER — ISOSORBIDE MONONITRATE ER 30 MG PO TB24: 60.0000 mg | ORAL_TABLET | Freq: Every evening | ORAL | Status: DC

## 2023-06-01 MED ORDER — LABETALOL HCL 5 MG/ML IV SOLN: 10.0000 mg | INTRAVENOUS | Status: DC | PRN

## 2023-06-01 MED ORDER — STROKE: EARLY STAGES OF RECOVERY BOOK
Freq: Once | Status: AC
Start: 2023-06-02 — End: 2023-06-02

## 2023-06-01 MED ORDER — LACTATED RINGERS IV BOLUS
1000.0000 mL | Freq: Once | INTRAVENOUS | Status: AC
  Administered 2023-06-01: 1000 mL via INTRAVENOUS

## 2023-06-01 MED ORDER — SODIUM CHLORIDE 0.9 % IV SOLN: INTRAVENOUS | Status: AC

## 2023-06-01 MED ORDER — ROSUVASTATIN CALCIUM 20 MG PO TABS
40.0000 mg | ORAL_TABLET | Freq: Every evening | ORAL | Status: DC
  Administered 2023-06-01: 40 mg via ORAL
  Filled 2023-06-01: qty 2

## 2023-06-01 MED ORDER — ENOXAPARIN SODIUM 40 MG/0.4ML IJ SOSY
40.0000 mg | PREFILLED_SYRINGE | INTRAMUSCULAR | Status: DC
  Administered 2023-06-01: 40 mg via SUBCUTANEOUS
  Filled 2023-06-01: qty 0.4

## 2023-06-01 MED ORDER — ACETAMINOPHEN 650 MG RE SUPP: 650.0000 mg | RECTAL | Status: DC | PRN

## 2023-06-01 MED ORDER — ACETAMINOPHEN 160 MG/5ML PO SOLN: 650.0000 mg | ORAL | Status: DC | PRN

## 2023-06-01 NOTE — ED Notes (Signed)
Patient taken to imaging. 

## 2023-06-01 NOTE — Evaluation (Signed)
Occupational Therapy Evaluation Patient Details Name: Emmilia Kranich MRN: [id number] DOB: 18-Sep-1945 Today's Date: 06/01/2023   History of Present Illness Vashtie Forst is a 77yoF who comes to [facility] after acute onset RUEweakness. PMH with at least 2 other CVA, both resolved durign admissions-2017, 2018.   Clinical Impression   Ms. Delpino was seen for OT evaluation this date. Prior to hospital admission, pt was independent in all aspects of ADL/IADL. 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 Denies falls history in last 6 months. Supportive caregiver at bedside confirms. Pt lives alone in a 1 level home, but her dtr voices plans to stay with pt if needed upon hospital DC. Currently pt reporting symptoms have resolved. Pt demonstrates baseline independence to perform ADL and mobility tasks and no strength, sensory, coordination, cognitive, or visual deficits appreciated with assessment. No skilled OT needs identified. Will sign off at this time. Please re-consult if additional OT needs arise.        If plan is discharge home, recommend the following: Assist for transportation    Functional Status Assessment  Patient has not had a recent decline in their functional status  Equipment Recommendations  None recommended by OT    Recommendations for Other Services       Precautions / Restrictions Precautions Precautions: None Restrictions Weight Bearing Restrictions Per Provider Order: No      Mobility Bed Mobility Overal bed mobility: Modified Independent             General bed mobility comments: HOB elevated.    Transfers Overall transfer level: Independent Equipment used: None               General transfer comment: Steady with amb in room.      Balance Overall balance assessment: No apparent balance deficits (not formally assessed), Independent                                         ADL either performed or assessed with clinical judgement    ADL Overall ADL's : At baseline                                       General ADL Comments: Pt denies strenght, sensory, visual, or cognitive deficits this date. States all symptoms have resolved and she feels back to baseline level of independence. MOD I for bed mobility, Indep for functional mobility at [facility] distance. At or near baseline level of functional independence.     Vision Baseline Vision/History: 1 Wears glasses Ability to See in Adequate Light: 1 Impaired Patient Visual Report: No change from baseline       Perception         Praxis         Pertinent Vitals/Pain Pain Assessment Pain Assessment: No/denies pain     Extremity/Trunk Assessment Upper Extremity Assessment Upper Extremity Assessment: Overall WFL for tasks assessed   Lower Extremity Assessment Lower Extremity Assessment: Overall WFL for tasks assessed       Communication Communication Communication: No apparent difficulties Cueing Techniques: Verbal cues   Cognition Arousal: Alert Behavior During Therapy: WFL for tasks assessed/performed Overall Cognitive Status: Within Functional Limits for tasks assessed  General Comments: Pleasant, conversational.     General Comments       Exercises Other Exercises Other Exercises: Pt/dtr educated on role of OT in acute settting, falls prevention strategies for home and hospital, and BE FAST stroke symptom recognition and response. 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   Shoulder Instructions      Home Living Family/patient expects to be discharged to:: Private residence Living Arrangements: Alone Available Help at Discharge: Family;Available 24 hours/day Type of Home: Apartment Home Access: Level entry     Home Layout: One level     Bathroom Shower/Tub: Chief Strategy Officer: Standard (toilet riser)                 Prior Functioning/Environment Prior Level of Function : Working/employed;Independent/Modified Independent             Mobility Comments: Indep, no AD. Denies falls in last 6 months. 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        OT Problem List: Decreased strength;Decreased coordination;Impaired UE functional use      OT Treatment/Interventions:      OT Goals(Current goals can be found in the care plan section) Acute Rehab OT Goals Patient Stated Goal: To get back to work OT Goal Formulation: All assessment and education complete, DC therapy Time For Goal Achievement: 06/01/23 Potential to Achieve Goals: Good  OT Frequency:      Co-evaluation              AM-PAC OT "6 Clicks" Daily Activity     Outcome Measure Help from another person eating meals?: None Help from another person taking care of personal grooming?: None Help from another person toileting, which includes using toliet, bedpan, or urinal?: None Help from another person bathing (including washing, rinsing, drying)?: None Help from another person to put on and taking off regular upper body clothing?: None Help from another person to put on and taking off regular lower body clothing?: None 6 Click Score: 24   End of Session    Activity Tolerance: Patient tolerated treatment well Patient left: in bed;with call bell/phone within reach;with family/visitor present  OT Visit Diagnosis: Other symptoms and signs involving the nervous system (Z30.865)                Time: 7846-9629 OT Time Calculation (min): 18 min Charges:  OT General Charges $OT Visit: 1 Visit OT Evaluation $OT Eval Low Complexity: 1 Low  Rockney Ghee, M.S., OTR/L 06/01/23, 3:19 PM

## 2023-06-01 NOTE — Assessment & Plan Note (Signed)
High-dose statin ?

## 2023-06-01 NOTE — ED Notes (Signed)
Swallow screen was performed prior to Ativan administration.

## 2023-06-01 NOTE — ED Notes (Signed)
Patient is going to MRI

## 2023-06-01 NOTE — ED Provider Triage Note (Signed)
Emergency Medicine Provider Triage Evaluation Note  Kimberly Trevino , a 78 y.o. female  was evaluated in triage.  Pt complains of right sided arm weakness, history of stroke, think she may be having another stroke, last known well was 9:30 PM last night.  Review of Systems  Positive:  Negative:   Physical Exam  There were no vitals taken for this visit. Gen:   Awake, no distress   Resp:  Normal effort  MSK:   Moves extremities without difficulty, some weakness noted for the right arm, some drift, Other:  No facial droop, 5/5 strength lower extremities  Medical Decision Making  Medically screening exam initiated at 8:22 AM.  Appropriate orders placed.  Kimberly Trevino was informed that the remainder of the evaluation will be completed by another provider, this initial triage assessment does not replace that evaluation, and the importance of remaining in the ED until their evaluation is complete.  Stroke protocols initiated, did not call code stroke due to last known well being last night at 9:30 PM   Kimberly Ghee, PA-C 06/01/23 [phone]

## 2023-06-01 NOTE — Evaluation (Signed)
Physical Therapy Evaluation Patient Details Name: Kimberly Trevino MRN: [id number] DOB: Aug 24, 1945 Today's Date: 06/01/2023  History of Present Illness  Kimberly Trevino is a 77yoF who comes [facility] after acute onset RUEweakness. PMH with at least 2 other CVA, both resolved durign admissions-2017, 2018.  Clinical Impression  Pt reports full resolution of RUE symptoms by time of my visit. Pt denies any involvement of BLE, loss of AMB function. Today pt demonstrates baseline mobility performance, given supervision due to several lines and leads and not having eaten in a while. Pt has no acute impairment at this time, no acute PT needs will sign off. No recommendations for DME or services at DC.       If plan is discharge home, recommend the following: Supervision due to cognitive status   Can travel by private vehicle        Equipment Recommendations None recommended by PT  Recommendations for Other Services       Functional Status Assessment Patient has not had a recent decline in their functional status     Precautions / Restrictions Precautions Precautions: None Restrictions Weight Bearing Restrictions Per Provider Order: No      Mobility  Bed Mobility Overal bed mobility: Independent                  Transfers Overall transfer level: Independent                      Ambulation/Gait Ambulation/Gait assistance: Modified independent (Device/Increase time) Gait Distance (Feet): 250 Feet Assistive device: None Gait Pattern/deviations: Step-through pattern          Stairs            Wheelchair Mobility     Tilt Bed    Modified Rankin (Stroke Patients Only)       Balance                                             Pertinent Vitals/Pain Pain Assessment Pain Assessment: No/denies pain    Home Living Family/patient expects to be discharged to:: Private residence Living Arrangements: Alone Available Help at  Discharge: Family;Available 24 hours/day Type of Home: Apartment Home Access: Level entry       Home Layout: One level        Prior Function Prior Level of Function : Working/employed               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     Extremity/Trunk Assessment   Upper Extremity Assessment Upper Extremity Assessment: Overall WFL for tasks assessed    Lower Extremity Assessment Lower Extremity Assessment: Overall WFL for tasks assessed       Communication      Cognition Arousal: Alert Behavior During Therapy: WFL for tasks assessed/performed Overall Cognitive Status: Within Functional Limits for tasks assessed                                          General Comments      Exercises     Assessment/Plan    PT Assessment Patient does not need any further PT services  PT Problem List  PT Treatment Interventions      PT Goals (Current goals can be found in the Care Plan section)  Acute Rehab PT Goals PT Goal Formulation: All assessment and education complete, DC therapy    Frequency       Co-evaluation               AM-PAC PT "6 Clicks" Mobility  Outcome Measure Help needed turning from your back to your side while in a flat bed without using bedrails?: None Help needed moving from lying on your back to sitting on the side of a flat bed without using bedrails?: None Help needed moving to and from a bed to a chair (including a wheelchair)?: None Help needed standing up from a chair using your arms (e.g., wheelchair or bedside chair)?: None Help needed to walk in hospital room?: A Little Help needed climbing 3-5 steps with a railing? : A Little 6 Click Score: 22    End of Session   Activity Tolerance: Patient tolerated treatment well;No increased pain Patient left: in bed;with call bell/phone within reach   PT Visit Diagnosis: Other symptoms and signs involving the  nervous system (R29.898)    Time: 1610-9604 PT Time Calculation (min) (ACUTE ONLY): 15 min   Charges:   PT Evaluation $PT Eval Moderate Complexity: 1 Mod   PT General Charges $$ ACUTE PT VISIT: 1 Visit        2:31 PM, 06/01/23 Rosamaria Lints, PT, DPT Physical Therapist - [facility]  [phone] (ASCOM)    Eisen Robenson C 06/01/2023, 2:29 PM

## 2023-06-01 NOTE — Assessment & Plan Note (Addendum)
Followed by [facility]  05/2020 cardiac catheterization w/ multivessel coronary artery disease, aggressive medical management  Titrate regimen for permissive HTN

## 2023-06-01 NOTE — ED Notes (Signed)
Dinner is at bedside

## 2023-06-01 NOTE — Consult Note (Signed)
NEUROLOGY CONSULT NOTE   Date of service: June 01, 2023 Patient Name: Kimberly Trevino MRN:  [id number] DOB:  23-Jul-1945 Chief Complaint: stroke code Requesting Provider: Floydene Flock, MD  History of Present Illness   This is a 78 year old woman with past medical history of left MCA stroke in 2017, B12 deficiency, bilateral carotid stenosis, CAD, hyperlipidemia, hypertension, paroxysmal SVT status post loop recorder implantation who presents after an episode of transient right arm flaccid paralysis.  Last known well was 7:55 AM.  After which she became suddenly unable to move her right arm at all.  It just hung at her side she cannot even wiggle her fingers.  It was also associated with numbness in that area.  The symptoms lasted approximately 10 minutes then started to improve although she did still have mild right upper extremity drift on the EDP exam after she arrived.  Stroke code was activated.  I immediately evaluated the patient, at which point all of her symptoms had resolved, she was back to baseline, and her NIH stroke scale was 0.  Therefore TNK was not administered.  Personal review of CT head showed a 9 mm hyperdense focus in the left frontal lobe white matter which was nonspecific and could either represent chronic small vessel ischemia or a recent white matter infarct.  There was no hemorrhage. CTA head and neck was not performed as part of the stroke code because her symptoms were not consistent with LVO.  She has a history of TIAs, the most recent being several years ago.  She is on aspirin and Plavix at home.  NIHSS = 0  Premorbid mRS = 0    ROS   Comprehensive ROS performed and pertinent positives documented in HPI    Past History   Past Medical History:  Diagnosis Date   Acute ischemic left MCA stroke (HCC) 04/11/2016   a.) small LEFT frontal MCA territory infarct   Anxiety    Arthritis    Asthma    B12 deficiency    Carotid stenosis, bilateral    a.)  Carotid doppler 04/10/2016: mild (1-49%) BILATERAL ICAs.   Chronic low back pain    Colon polyp    Coronary artery disease 2011   a.) LHC 2011: 50% mLAD; no intervention opting for med mgmt. b.) LHC 06/04/2020: 70% p-mLAD, 90% oD1, 70% RI, 70-80% Cx marginal branch, 60% RCA; anatomy not ideally suited for PCI --> CVTS consult for CABG consideration recommended for refractory symptoms.   Depression    Diastolic dysfunction    a.)  TTE 04/11/2016: EF 60%; no RWMAs; mild MR; mild LA dilation; G1DD.   Female cystocele    GERD (gastroesophageal reflux disease)    Hyperlipidemia    Hypertension    Hypothyroidism    Incontinence in female    Long term current use of antithrombotics/antiplatelets    a.) DAPT therapy (ASA + clopidogrel)   OAB (overactive bladder)    Obesity    Paresthesias in left hand    PSVT (paroxysmal supraventricular tachycardia) (HCC)    Tachycardia 2010   TIA (transient ischemic attack)    Vitamin D deficiency     Past Surgical History:  Procedure Laterality Date   ABDOMINAL HYSTERECTOMY     CARDIAC CATHETERIZATION  05/16/2009   50% to the mid LAD lesion   CARPAL TUNNEL RELEASE Bilateral    CERVICAL FUSION     COLONOSCOPY WITH PROPOFOL N/A 05/21/2021   Procedure: COLONOSCOPY WITH PROPOFOL;  Surgeon: Eather Colas  T, MD;  Location: [facility];  Service: Endoscopy;  Laterality: N/A;  PLAVIX   KNEE ARTHROSCOPY Left    PARTIAL HYSTERECTOMY  05/16/1997   REVERSE SHOULDER ARTHROPLASTY Right 08/17/2021   Procedure: REVERSE SHOULDER ARTHROPLASTY WITH BICEPS TENODESIS;  Surgeon: Christena Flake, MD;  Location: [facility];  Service: Orthopedics;  Laterality: Right;   TEE WITHOUT CARDIOVERSION N/A 06/13/2016   Procedure: TRANSESOPHAGEAL ECHOCARDIOGRAM (TEE);  Surgeon: Iran Ouch, MD;  Location: [facility];  Service: Cardiovascular;  Laterality: N/A;   TONSILLECTOMY     TUBAL LIGATION      Family History: Family History  Problem Relation Age of Onset    Hypertension Mother    Heart attack Father 2       MI   Heart disease Father    Hypertension Father    Hypertension Sister    Hyperlipidemia Sister    Heart attack Brother        cardiac arrest   Hypertension Sister    Hyperlipidemia Sister    Breast cancer Neg Hx     Social History  reports that she has never smoked. She has never been exposed to tobacco smoke. She has never used smokeless tobacco. She reports that she does not drink alcohol and does not use drugs.  Allergies  Allergen Reactions   Doxycycline     Angioedema + itching    Sulfa Antibiotics Hives    Welps   Gemtesa [Vibegron] Diarrhea    Medications   Current Facility-Administered Medications:    [START ON 06/02/2023]  stroke: early stages of recovery book, , Does not apply, Once, Floydene Flock, MD   0.9 %  sodium chloride infusion, , Intravenous, Continuous, Alvester Morin, Francoise Schaumann, MD   acetaminophen (TYLENOL) tablet 650 mg, 650 mg, Oral, Q4H PRN **OR** acetaminophen (TYLENOL) 160 MG/5ML solution 650 mg, 650 mg, Per Tube, Q4H PRN **OR** acetaminophen (TYLENOL) suppository 650 mg, 650 mg, Rectal, Q4H PRN, Floydene Flock, MD   enoxaparin (LOVENOX) injection 40 mg, 40 mg, Subcutaneous, Q24H, Floydene Flock, MD   labetalol (NORMODYNE) injection 10 mg, 10 mg, Intravenous, Q2H PRN, Floydene Flock, MD   senna-docusate (Senokot-S) tablet 1 tablet, 1 tablet, Oral, QHS PRN, Floydene Flock, MD   sodium chloride flush (NS) 0.9 % injection 3 mL, 3 mL, Intravenous, Once, Tan, Franchot Erichsen, MD  Current Outpatient Medications:    acetaminophen (TYLENOL) 325 MG tablet, Take 2 tablets (650 mg total) by mouth every 6 (six) hours as needed for mild pain (or Fever >/= 101)., Disp: , Rfl:    aspirin EC 81 MG EC tablet, Take 1 tablet (81 mg total) by mouth daily., Disp: , Rfl:    azelastine (ASTELIN) 0.1 % nasal spray, Place 1 spray into both nostrils 2 (two) times daily as needed for rhinitis or allergies. Use in each nostril as  directed, Disp: , Rfl:    clopidogrel (PLAVIX) 75 MG tablet, Take 1 tablet (75 mg total) by mouth daily., Disp: 90 tablet, Rfl: 0   DILT-XR 240 MG 24 hr capsule, Take 1 capsule by mouth daily., Disp: , Rfl:    docusate sodium (COLACE) 100 MG capsule, Take 100 mg by mouth daily., Disp: , Rfl:    estradiol (ESTRACE VAGINAL) 0.1 MG/GM vaginal cream, Apply 0.5mg  (pea-sized amount)  just inside the vaginal introitus with a finger-tip on Monday, Wednesday and Friday nights. (Patient taking differently: Place 1 Applicatorful vaginally daily as needed. Apply 0.5mg  (pea-sized amount)  just inside the  vaginal introitus with a finger-tip), Disp: 42.5 g, Rfl: 12   estradiol (ESTRACE) 0.5 MG tablet, Take 0.5 mg by mouth daily., Disp: , Rfl:    EUTHYROX 25 MCG tablet, Take 25 mcg by mouth daily before breakfast., Disp: , Rfl:    famotidine (PEPCID) 40 MG tablet, Take 40 mg by mouth every morning., Disp: , Rfl:    fluticasone-salmeterol (ADVAIR) 250-50 MCG/ACT AEPB, Inhale 1 puff into the lungs 2 (two) times daily as needed (asthma)., Disp: , Rfl:    gabapentin (NEURONTIN) 300 MG capsule, Take 300 mg by mouth at bedtime., Disp: , Rfl:    isosorbide mononitrate (IMDUR) 60 MG 24 hr tablet, Take 60 mg by mouth every evening., Disp: , Rfl:    lisinopril (ZESTRIL) 10 MG tablet, Take 20 mg by mouth every evening., Disp: , Rfl:    loratadine (CLARITIN) 10 MG tablet, Take 10 mg by mouth every morning., Disp: , Rfl:    metoprolol succinate (TOPROL-XL) 100 MG 24 hr tablet, Take 100 mg by mouth every evening. Take with or immediately following a meal., Disp: , Rfl:    Multiple Vitamin (MULTIVITAMIN) tablet, Take 1 tablet by mouth daily., Disp: , Rfl:    Naphazoline-Pheniramine (EQ EYE ALLERGY RELIEF) 0.027-0.315 % SOLN, Place 1 drop into both eyes daily as needed (eye allergies)., Disp: , Rfl:    rosuvastatin (CRESTOR) 40 MG tablet, Take 40 mg by mouth every evening., Disp: , Rfl:    sertraline (ZOLOFT) 50 MG tablet, Take 25  mg by mouth every evening., Disp: , Rfl:    vitamin C (ASCORBIC ACID) 500 MG tablet, Take 500 mg by mouth daily., Disp: , Rfl:   Vitals   Vitals:   06/01/23 0822 06/01/23 0824  BP:  (!) 153/74  Pulse:  64  Resp:  18  Temp:  98.1 F (36.7 C)  TempSrc:  Oral  SpO2:  100%  Weight: 73.9 kg   Height: 5\' 2"  (1.575 m)     Body mass index is 29.81 kg/m.  Physical Exam   Constitutional: Appears well-developed and well-nourished.  Psych: Affect appropriate to situation.  Eyes: No scleral injection.  HENT: No OP obstruction.  Head: Normocephalic.  Cardiovascular: Normal rate and regular rhythm.  Respiratory: Effort normal, non-labored breathing.  GI: Soft.  No distension. There is no tenderness.  Skin: WDI.   Neurologic Examination    Physical Exam Gen: A&Ox4, NAD HEENT: Atraumatic, normocephalic; oropharynx clear, tongue without atrophy or fasciculations. Resp: CTAB, normal work of breathing CV: RRR, extremities appear well-perfused. Abd: soft/NT/ND Extrem: Nml bulk; no cyanosis, clubbing, or edema.  Neuro: *MS: A&O x4. Follows multi-step commands.  *Speech: no dysarthria or aphasia, able to name and repeat. *CN:    I: Deferred   II,III: PERRLA, VFF by confrontation, optic discs not visualized 2/2 pupillary constriction   III,IV,VI: EOMI w/o nystagmus, no ptosis   V: Sensation intact from V1 to V3 to LT   VII: Eyelid closure was full.  Smile symmetric.   VIII: Hearing intact to voice   IX,X: Voice normal, palate elevates symmetrically    XI: SCM/trap 5/5 bilat   XII: Tongue protrudes midline, no atrophy or fasciculations  *Motor:   Normal bulk.  No tremor, rigidity or bradykinesia. No pronator drift.   Strength: Dlt Bic Tri WE WrF FgS Gr HF KnF KnE PlF DoF    Left 5 5 5 5 5 5 5 5 5 5 5 5     Right 5 5 5 5  5  5 5 5 5 5 5 5    *Sensory: Intact to light touch, pinprick, temperature vibration throughout. Symmetric. Propioception intact bilat.  No double-simultaneous  extinction.  *Coordination:  Finger-to-nose, heel-to-shin, rapid alternating motions were intact. *Reflexes:  2+ and symmetric throughout without clonus; toes down-going bilat *Gait: deferred  Labs/Imaging/Neurodiagnostic studies   CBC:  Recent Labs  Lab 2023-06-08 0826  WBC 3.9*  NEUTROABS 2.1  HGB 14.5  HCT 41.7  MCV 85.6  PLT 251   Basic Metabolic Panel:  Lab Results  Component Value Date   NA 136 June 08, 2023   K 3.8 06-08-2023   CO2 24 06/08/23   GLUCOSE 109 (H) Jun 08, 2023   BUN 15 June 08, 2023   CREATININE 0.90 2023/06/08   CALCIUM 9.5 06-08-23   GFRNONAA >60 Jun 08, 2023   GFRAA 55 (L) 11/18/2019   Lipid Panel:  Lab Results  Component Value Date   LDLCALC 52 06/10/2016   HgbA1c:  Lab Results  Component Value Date   HGBA1C 5.9 (H) 06/10/2016   Urine Drug Screen: No results found for: "LABOPIA", "COCAINSCRNUR", "LABBENZ", "AMPHETMU", "THCU", "LABBARB"  Alcohol Level     Component Value Date/Time   ETH <10 Jun 08, 2023 0826   INR  Lab Results  Component Value Date   INR 0.91 06/09/2016   APTT  Lab Results  Component Value Date   APTT 25 06/09/2016   AED levels: No results found for: "PHENYTOIN", "ZONISAMIDE", "LAMOTRIGINE", "LEVETIRACETA"  CT Head without contrast(Personally reviewed): 1. 9 mm hypodense focus within the left frontal lobe white matter, nonspecific. This could reflect chronic small vessel ischemia. However, a recent white matter infarct cannot be excluded. A brain MRI may be obtained for further evaluation, as clinically warranted. 2. Paranasal sinus disease at the imaged levels, as described.  CT angio Head and Neck with contrast(Personally reviewed): pending  MRI Brain(Personally reviewed): pending   ASSESSMENT   This is a 78 year old woman with past medical history of left MCA stroke in 2017, B12 deficiency, bilateral carotid stenosis, CAD, hyperlipidemia, hypertension, paroxysmal SVT status post loop recorder implantation who  presents after an episode of transient right arm flaccid paralysis, now resolved, c/f TIA vs small acute ischemic stroke.  Of note patient does take estradiol po.  RECOMMENDATIONS   - Admit for stroke/TIA workup - Permissive HTN x48 hrs from sx onset or until stroke ruled out by MRI goal BP <220/120. PRN labetalol or hydralazine if BP above these parameters. Avoid oral antihypertensives. - MRI brain wo contrast - CTA H&N - TTE  - Check A1c and LDL + add statin per guidelines - Continue home aspirin and plavix (she already took both today) - Continue crestor 40mg  daily - q4 hr neuro checks - STAT head CT for any change in neuro exam - Tele - PT/OT/SLP - Stroke education - Amb referral to neurology upon discharge   ______________________________________________________________________    Signed, Jefferson Fuel, MD [facility]

## 2023-06-01 NOTE — Assessment & Plan Note (Addendum)
S/p Implantable loop recorder  Continue metoprolol  Continue diltiazem

## 2023-06-01 NOTE — Progress Notes (Signed)
   06/01/23 1040  Spiritual Encounters  Type of Visit Initial  Care provided to: Pt and family  Conversation partners present during encounter Nurse  Referral source Code page  Reason for visit Code  OnCall Visit Yes  Spiritual Framework  Presenting Themes Meaning/purpose/sources of inspiration;Values and beliefs;Courage hope and growth   Prayed with patient and daughter; let them know chaplain services continue to be available.

## 2023-06-01 NOTE — Code Documentation (Signed)
Stroke Response Nurse Documentation Code Documentation  Kimberly Trevino is a 78 y.o. female arriving to [facility] via [employer] on 06/01/2023 with past medical hx of CAD, PSVT, HLD, HTN, previous stroke, TIA, hypothyroidism, carotid stenosis, diastolic dysfunction. On aspirin 81 mg daily and clopidogrel 75 mg daily. Code stroke was activated by ED by EDP.  Patient from work where she was LKW at 0730 and now complaining of right arm weakness, numbness. Patient reports waking up at 0430 in her normal state [facility] and felt to be at her baseline while getting ready for work. On her way out around 0730-0800, she noticed she was having a difficult time locking her front door with her right hand. When she got to work, she reports her right arm "fell" and couldn't move it at ~0755.    CT performed prior to code stroke activation. Delay in activation on arrival due to LKW initially thought to be before bed last night. Stroke team at the bedside after code stroke activation. Labs drawn after activation. NIHSS 0, see documentation for details and code stroke times. Patient reports being back to baseline at time of assessment with neurologist. The following imaging was completed:  CT Head. Patient is not a candidate for IV Thrombolytic due to full resolution of symptoms, per MD. Patient is not a candidate for IR due to NIHSS 0, per MD.   Care Plan: every 2 hour NIHSS and VS. Swallow screen per protocol.   Bedside handoff with ED [facility] Tawanna Solo  Stroke Response RN

## 2023-06-01 NOTE — ED Notes (Addendum)
First Nurse Note:  Pt presents to the ED with stroke like symptoms. Pt c/o R sided weakness, dizziness, and gait issues when she woke up with AM. Report she went to sleep last night 0930pm. LKW 0930. Denies any numbness, speech difficulties, or visual changes. VAN negative. Pt has hx of stroke. Pt is A&Ox4 and NAD. Pt prioritized for triage.

## 2023-06-01 NOTE — ED Triage Notes (Signed)
Patient to ED via POV for right side weakness and numbness in right hand. LWK 2130 last night when going to bed. Denies visual deficients or aphasia. Hx of strokes  Darl Pikes, [place] at triage- no code stroke at this time.

## 2023-06-01 NOTE — H&P (Addendum)
History and Physical    Patient: Kimberly Trevino ZOX:[id number] DOB: 11-07-1945 DOA: 06/01/2023 DOS: the patient was seen and examined on 06/01/2023 PCP: Dortha Kern, MD  Patient coming from: Home  Chief Complaint:  Chief Complaint  Patient presents with   Stroke Symptoms   HPI: [name] is a 78 y.o. female with medical history significant of CVA, coronary artery disease, paroxysmal SVT status post loop recorder, hypertension, hyperlipidemia presenting with TIA.  Patient reports acute onset of right upper extremity weakness since around 8 AM today.  Noted prior history of CVA 2018 with chronic right upper extremity weakness.  There was an acute worsening today.  On aspirin and Plavix.  No reported recent medication changes.  Denies any alcohol or tobacco use.  Does admit to high salt diet.  Right extremity weakness persisted though transiently improved over the course of the morning.  No chest pain or shortness of breath.  No nausea or vomiting.  No focal hemiparesis.  Minimal confusion. Presented to the ER afebrile, hemodynamically stable.  Satting well on room air.  Labs grossly within normal limits.  CT head with 9 mm left frontal lobe lesion-?  Chronic ischemia versus new infarct. Review of Systems: As mentioned in the history of present illness. All other systems reviewed and are negative. Past Medical History:  Diagnosis Date   Acute ischemic left MCA stroke (HCC) 04/11/2016   a.) small LEFT frontal MCA territory infarct   Anxiety    Arthritis    Asthma    B12 deficiency    Carotid stenosis, bilateral    a.) Carotid doppler 04/10/2016: mild (1-49%) BILATERAL ICAs.   Chronic low back pain    Colon polyp    Coronary artery disease 2011   a.) LHC 2011: 50% mLAD; no intervention opting for med mgmt. b.) LHC 06/04/2020: 70% p-mLAD, 90% oD1, 70% RI, 70-80% Cx marginal branch, 60% RCA; anatomy not ideally suited for PCI --> CVTS consult for CABG consideration recommended  for refractory symptoms.   Depression    Diastolic dysfunction    a.)  TTE 04/11/2016: EF 60%; no RWMAs; mild MR; mild LA dilation; G1DD.   Female cystocele    GERD (gastroesophageal reflux disease)    Hyperlipidemia    Hypertension    Hypothyroidism    Incontinence in female    Long term current use of antithrombotics/antiplatelets    a.) DAPT therapy (ASA + clopidogrel)   OAB (overactive bladder)    Obesity    Paresthesias in left hand    PSVT (paroxysmal supraventricular tachycardia) (HCC)    Tachycardia 2010   TIA (transient ischemic attack)    Vitamin D deficiency    Past Surgical History:  Procedure Laterality Date   ABDOMINAL HYSTERECTOMY     CARDIAC CATHETERIZATION  05/16/2009   50% to the mid LAD lesion   CARPAL TUNNEL RELEASE Bilateral    CERVICAL FUSION     COLONOSCOPY WITH PROPOFOL N/A 05/21/2021   Procedure: COLONOSCOPY WITH PROPOFOL;  Surgeon: Regis Bill, MD;  Location: [facility] END[facility]COPY;  Service: Endoscopy;  Laterality: N/A;  PLAVIX   KNEE ARTHROSCOPY Left    PARTIAL HYSTERECTOMY  05/16/1997   REVERSE SHOULDER ARTHROPLASTY Right 08/17/2021   Procedure: REVERSE SHOULDER ARTHROPLASTY WITH BICEPS TENODESIS;  Surgeon: Christena Flake, MD;  Location: [facility] ORS;  Service: Orthopedics;  Laterality: Right;   TEE WITHOUT CARDIOVERSION N/A 06/13/2016   Procedure: TRANSESOPHAGEAL ECHOCARDIOGRAM (TEE);  Surgeon: Iran Ouch, MD;  Location: [facility] ORS;  Service: Cardiovascular;  Laterality: N/A;   TONSILLECTOMY     TUBAL LIGATION     Social History:  reports that she has never smoked. She has never been exposed to tobacco smoke. She has never used smokeless tobacco. She reports that she does not drink alcohol and does not use drugs.  Allergies  Allergen Reactions   Doxycycline     Angioedema + itching    Sulfa Antibiotics Hives    Welps   Gemtesa [Vibegron] Diarrhea    Family History  Problem Relation Age of Onset   Hypertension Mother    Heart attack  Father 52       MI   Heart disease Father    Hypertension Father    Hypertension Sister    Hyperlipidemia Sister    Heart attack Brother        cardiac arrest   Hypertension Sister    Hyperlipidemia Sister    Breast cancer Neg Hx     Prior to Admission medications   Medication Sig Start Date End Date Taking? Authorizing Provider  clopidogrel (PLAVIX) 75 MG tablet Take 1 tablet (75 mg total) by mouth daily. 03/13/17  Yes [name], Ryan M, PA-C  DILT-XR 240 MG 24 hr capsule Take 1 capsule by mouth daily. 02/21/22  Yes [provider]  estradiol (ESTRACE) 0.5 MG tablet Take 0.5 mg by mouth daily.   Yes [provider]  EUTHYROX 25 MCG tablet Take 25 mcg by mouth daily before breakfast. 11/04/20  Yes [provider]  fluticasone-salmeterol (ADVAIR) 250-50 MCG/ACT AEPB Inhale 1 puff into the lungs 2 (two) times daily as needed (asthma).   Yes [provider]  gabapentin (NEURONTIN) 300 MG capsule Take 300 mg by mouth at bedtime. 05/19/23  Yes [provider]  isosorbide mononitrate (IMDUR) 60 MG 24 hr tablet Take 60 mg by mouth every evening. 10/27/20  Yes [provider]  lisinopril (ZESTRIL) 10 MG tablet Take 10 mg by mouth every evening.   Yes [provider]  metoprolol succinate (TOPROL-XL) 100 MG 24 hr tablet Take 100 mg by mouth every evening. Take with or immediately following a meal.   Yes [provider]  rosuvastatin (CRESTOR) 40 MG tablet Take 40 mg by mouth every evening.   Yes [provider]  sertraline (ZOLOFT) 50 MG tablet Take 50 mg by mouth every evening. 01/12/21  Yes [provider]  acetaminophen (TYLENOL) 325 MG tablet Take 2 tablets (650 mg total) by mouth every 6 (six) hours as needed for mild pain (or Fever >/= 101). 06/13/16   Ramonita Lab, MD  aspirin EC 81 MG EC tablet Take 1 tablet (81 mg total) by mouth daily. 06/14/16   Ramonita Lab, MD  estradiol (ESTRACE VAGINAL) 0.1 MG/GM vaginal  cream Apply 0.5mg  (pea-sized amount)  just inside the vaginal introitus with a finger-tip on Monday, Wednesday and Friday nights. 03/16/23   Michiel Cowboy A, PA-C  famotidine (PEPCID) 40 MG tablet Take 40 mg by mouth every morning.    [provider]  fluocinonide cream (LIDEX) 0.05 % Apply 1 application. topically as needed (irritation). 07/08/13   [provider]  loratadine (CLARITIN) 10 MG tablet Take 10 mg by mouth every morning.    [provider]  Multiple Vitamin (MULTIVITAMIN) tablet Take 1 tablet by mouth daily.    [provider]  Naphazoline-Pheniramine (EQ EYE ALLERGY RELIEF) 0.027-0.315 % SOLN Place 1 drop into both eyes daily as needed (eye allergies).  [provider]  vitamin C (ASCORBIC ACID) 500 MG tablet Take 500 mg by mouth daily.    [provider]    Physical Exam: Vitals:   06/01/23 0822 06/01/23 0824  BP:  (!) 153/74  Pulse:  64  Resp:  18  Temp:  98.1 F (36.7 C)  TempSrc:  Oral  SpO2:  100%  Weight: 73.9 kg   Height: 5\' 2"  (1.575 m)    Physical Exam Constitutional:      Appearance: She is normal weight.  HENT:     Head: Normocephalic and atraumatic.     Nose: Nose normal.     Mouth/Throat:     Mouth: Mucous membranes are moist.  Eyes:     Pupils: Pupils are equal, round, and reactive to light.  Cardiovascular:     Rate and Rhythm: Normal rate and regular rhythm.  Pulmonary:     Effort: Pulmonary effort is normal.  Abdominal:     General: Bowel sounds are normal.  Musculoskeletal:        General: Normal range of motion.  Skin:    General: Skin is warm.  Neurological:     General: No focal deficit present.  Psychiatric:        Mood and Affect: Mood normal.     Data Reviewed:  There are no new results to review at this time.  CT HEAD WO CONTRAST CLINICAL DATA:  Provided history: Neuro deficit, acute, stroke suspected. Right-sided weakness. Dizziness. Gait issues.  EXAM: CT HEAD  WITHOUT CONTRAST  TECHNIQUE: Contiguous axial images were obtained from the base of the skull through the vertex without intravenous contrast.  RADIATION DOSE REDUCTION: This exam was performed according to the departmental dose-optimization program which includes automated exposure control, adjustment of the mA and/or kV according to patient size and/or use of iterative reconstruction technique.  COMPARISON:  Head CT 07/03/2019.  FINDINGS: Brain:  9 mm focus of hypodensity within the left frontal lobe centrum semiovale (series 3, image 17).  Partially empty sella turcica.  There is no acute intracranial hemorrhage.  No demarcated cortical infarct.  No extra-axial fluid collection.  No evidence of an intracranial mass.  No midline shift.  Vascular: No hyperdense vessel. Atherosclerotic calcifications.  Skull: No calvarial fracture or aggressive osseous lesion.  Sinuses/Orbits: No mass or acute finding within the imaged orbits. Minimal mucosal thickening within the frontal sinuses, bilaterally. Mild-to-moderate mucosal thickening within the ethmoid sinuses, bilaterally. Mucosal thickening within the partially imaged right maxillary sinus (with associated chronic reactive osteitis).  IMPRESSION: 1. 9 mm hypodense focus within the left frontal lobe white matter, nonspecific. This could reflect chronic small vessel ischemia. However, a recent white matter infarct cannot be excluded. A brain MRI may be obtained for further evaluation, as clinically warranted. 2. Paranasal sinus disease at the imaged levels, as described.  Electronically Signed   By: Jackey Loge D.O.   On: 06/01/2023 09:10  Lab Results  Component Value Date   WBC 3.9 (L) 06/01/2023   HGB 14.5 06/01/2023   HCT 41.7 06/01/2023   MCV 85.6 06/01/2023   PLT 251 06/01/2023   Last metabolic panel Lab Results  Component Value Date   GLUCOSE 109 (H) 06/01/2023   NA 136 06/01/2023   K 3.8 06/01/2023    CL 102 06/01/2023   CO2 24 06/01/2023   BUN 15 06/01/2023   CREATININE 0.90 06/01/2023   GFRNONAA >60 06/01/2023   CALCIUM 9.5 06/01/2023   PHOS 3.0 11/18/2019  PROT 7.6 06/01/2023   ALBUMIN 4.4 06/01/2023   BILITOT 1.0 06/01/2023   ALKPHOS 64 06/01/2023   AST 50 (H) 06/01/2023   ALT 39 06/01/2023   ANIONGAP 10 06/01/2023    Assessment and Plan: * TIA (transient ischemic attack) Transient worsened R upper extremity weakness since 8am  Noted baseline hx/o L parietal CVA 2018 and chronic R sided weakness  CT head L frontal lobe lesion ? Acute vs chronic  S/p formal neuro evaluation  Will plan for formal TIA evaluation including MRI brain, CTA head and neck, 2D ECHO, risk stratification labs  Antiplatelet regimen per neuro recs  Follow closely    Coronary artery disease Followed by [facility]  05/2020 cardiac catheterization w/ multivessel coronary artery disease, aggressive medical management  Titrate regimen for permissive HTN   Elevated glucose Blood sugar 110s Check A1C Monitor  Hypertension Allow for permissive HTN in setting of TIA evaluation  Prn IV labetalol for SBP> 220 or DBP>110    Hyperlipidemia High dose statin   PSVT (paroxysmal supraventricular tachycardia) (HCC) S/p Implantable loop recorder  Continue metoprolol  Continue diltiazem       Advance Care Planning:   Code Status: Full Code   Consults: Neurology   Family Communication: Family at the bedside   Severity of Illness: The appropriate patient status for this patient is INPATIENT. Inpatient status is judged to be reasonable and necessary in order to provide the required intensity of service to ensure the patient's safety. The patient's presenting symptoms, physical exam findings, and initial radiographic and laboratory data in the context of their chronic comorbidities is felt to place them at high risk for further clinical deterioration. Furthermore, it is not anticipated that  the patient will be medically stable for discharge from the hospital within 2 midnights of admission.   * I certify that at the point of admission it is my clinical judgment that the patient will require inpatient hospital care spanning beyond 2 midnights from the point of admission due to high intensity of service, high risk for further deterioration and high frequency of surveillance required.*  Author: Floydene Flock, MD 06/01/2023 11:21 AM  For on call review [email or web address].

## 2023-06-01 NOTE — ED Notes (Signed)
Code Stroke  called to [facility] per Dr. Bing Neighbors MD.

## 2023-06-01 NOTE — ED Provider Notes (Signed)
Kimberly Trevino Provider Note    Event Date/Time   First MD Initiated Contact with Patient 06/01/23 [phone]     (approximate)   History   Stroke Symptoms   HPI  Kimberly Trevino is a 78 y.o. female with history of left MCA stroke in 2017, without residual deficits, on aspirin and Plavix, hyperlipidemia, coronary artery disease, hypertension, presenting with right upper extremity weakness.  Patient states that she woke up at 4:30 AM today, was completely normal and at her baseline.  States that around 730AM she noticed that she was having difficulty using her right hand to lock her door, states that when she got to work by 8, she was having difficulty lifting her entire arm.  States that the symptoms are gradually improving but not completely at her baseline.  She denies any new falls or trauma.  States that she has been intermittently lightheaded for the last 3 days.  No fevers, no chest pain or shortness of breath, no cough, urinary symptoms, nausea, vomiting, diarrhea, leg swelling.  Per daughter patient has not been keeping up with her hydration status.  Has had right shoulder surgery in the past but typically has equal strength bilaterally.  Independent review of her prior history as well as charts, she had a prior stroke in 2017, CT head imaging that showed no acute findings, did show mild generalized atrophy and chronic small vessel changes.     Physical Exam   Triage Vital Signs: ED Triage Vitals  Encounter Vitals Group     BP 06/01/23 0824 (!) 153/74     Systolic BP Percentile --      Diastolic BP Percentile --      Pulse Rate 06/01/23 0824 64     Resp 06/01/23 0824 18     Temp 06/01/23 0824 98.1 F (36.7 C)     Temp Source 06/01/23 0824 Oral     SpO2 06/01/23 0824 100 %     Weight 06/01/23 0822 163 lb (73.9 kg)     Height 06/01/23 0822 5\' 2"  (1.575 m)     Head Circumference --      Peak Flow --      Pain Score 06/01/23 0822 0     Pain Loc --       Pain Education --      Exclude from Growth Chart --     Most recent vital signs: Vitals:   06/01/23 0824  BP: (!) 153/74  Pulse: 64  Resp: 18  Temp: 98.1 F (36.7 C)  SpO2: 100%     General: Awake, no distress.  CV:  Good peripheral perfusion.  Resp:  Normal effort.  Abd:  No distention.  Other:  Pupils equal and reactive, extraocular movements are intact, no sensory deficits, no cranial nerve deficits, she has a very mild pronator drift on the right upper extremity, right upper extremity is mildly weaker than the left, she has equal strength to her lower extremities, no sensory deficits, no dysmetria.  Was able to get her up to ambulate without any ataxia.  TMs are clear bilaterally.  She appears mildly dry.   ED Results / Procedures / Treatments   Labs (all labs ordered are listed, but only abnormal results are displayed) Labs Reviewed  CBC - Abnormal; Notable for the following components:      Result Value   WBC 3.9 (*)    All other components within normal limits  COMPREHENSIVE METABOLIC PANEL - Abnormal;  Notable for the following components:   Glucose, Bld 109 (*)    AST 50 (*)    All other components within normal limits  DIFFERENTIAL  ETHANOL  PROTIME-INR  APTT  URINE DRUG SCREEN, QUALITATIVE ([facility] ONLY)  URINALYSIS, ROUTINE W REFLEX MICROSCOPIC  CBG MONITORING, ED  CBG MONITORING, ED     EKG  Sinus rhythm, rate 64, normal QRS, normal QTc, T wave flattening in 3, V3, no ischemic ST elevation, not significant she compared to prior   RADIOLOGY CT head on my interpretation without any obvious intracranial hemorrhage.   PROCEDURES:  Critical Care performed: Yes, see critical care procedure note(s)  .Critical Care  Performed by: Claybon Jabs, MD Authorized by: Claybon Jabs, MD   Critical care provider statement:    Critical care time (minutes):  35   Critical care was necessary to treat or prevent imminent or life-threatening deterioration of the  following conditions:  CNS failure or compromise   Critical care was time spent personally by me on the following activities:  Development of treatment plan with patient or surrogate, discussions with consultants, evaluation of patient's response to treatment, examination of patient, ordering and review of laboratory studies, ordering and review of radiographic studies, ordering and performing treatments and interventions, pulse oximetry, re-evaluation of patient's condition and review of old charts    MEDICATIONS ORDERED IN ED: Medications  sodium chloride flush (NS) 0.9 % injection 3 mL (0 mLs Intravenous Hold 06/01/23 0929)  lactated ringers bolus 1,000 mL (1,000 mLs Intravenous New Bag/Given 06/01/23 1051)     IMPRESSION / MDM / ASSESSMENT AND PLAN / ED COURSE  I reviewed the triage vital signs and the nursing notes.                              Differential diagnosis includes, but is not limited to, CVA, TIA, dehydration, electrolyte derangements, considered viral illness or infection causing some of her lightheadedness but she has no infectious symptoms.  Will still get a chest x-ray as well as a UA.  Patient's presentation is most consistent with acute presentation with potential threat to life or bodily function.  Patient with prior history of stroke in 2017, with no residual deficits presenting with right arm weakness that started at 730, stroke alert was activated, on neurology's assessment patient has completely returned to baseline, therefore not a tPA candidate since her symptoms have completely resolved.  Independent review of labs, ethanol level is not elevated, electrolytes no severely deranged, creatinine is normal, H&H is stable.  Will give her some IV fluids.  Consulted with neurology who recommended admission.  Consult to hospitalist agreeable plan for admission will evaluate the patient.  She is admitted.  Shared decision making done patient and she is agreeable  plan.  Clinical Course as of 06/01/23 1057  Thu Jun 01, 2023  1049 Consulted with neuro about patient, since her symptoms had completely resolved, recommended admission for TIA/CVA workup. [TT]  1049 Consult the hospitalist was agreeable plan for admission will evaluate the patient. [TT]    Clinical Course User Index [TT] Jodie Echevaria, Franchot Erichsen, MD     FINAL CLINICAL IMPRESSION(S) / ED DIAGNOSES   Final diagnoses:  Weakness of right upper extremity  History of CVA (cerebrovascular accident)  Lightheadedness     Rx / DC Orders   ED Discharge Orders     None        Note:  This document was prepared using Dragon voice recognition software and may include unintentional dictation errors.    Claybon Jabs, MD 06/01/23 1057

## 2023-06-01 NOTE — ED Notes (Signed)
Pt contact made and myself introduced. Pt is CAO and breathing normally. Pt is resting and does not appear to be in any distress.   Called CCMD and added pt to monitoring board.

## 2023-06-01 NOTE — ED Notes (Signed)
Patient is sitting upright eating dinner tray. No difficulties noted.

## 2023-06-01 NOTE — Progress Notes (Signed)
SLP Cancellation Note  Patient Details Name: Kimberly Trevino MRN: [id number] DOB: June 27, 1945   Cancelled treatment:       Reason Eval/Treat Not Completed: Patient at procedure or test/unavailable (MRI). SLP attempted to complete cognitive linguistic assessment- pt off the floor for MRI. SLP will follow up as schedule allows and when pt is available.   [place] Hoke Baer Clapp  MS [facility] SLP   [place] J Clapp 06/01/2023, 1:32 PM

## 2023-06-01 NOTE — Assessment & Plan Note (Signed)
Allow for permissive HTN in setting of TIA evaluation  Prn IV labetalol for SBP> 220 or DBP>110

## 2023-06-01 NOTE — Assessment & Plan Note (Signed)
Transient worsened R upper extremity weakness since 8am  Noted baseline hx/o L parietal CVA 2018 and chronic R sided weakness  CT head L frontal lobe lesion ? Acute vs chronic  S/p formal neuro evaluation  Will plan for formal TIA evaluation including MRI brain, CTA head and neck, 2D ECHO, risk stratification labs  Antiplatelet regimen per neuro recs  Follow closely

## 2023-06-02 ENCOUNTER — Observation Stay (HOSPITAL_BASED_OUTPATIENT_CLINIC_OR_DEPARTMENT_OTHER)
Admit: 2023-06-02 | Discharge: 2023-06-02 | Disposition: A | Discharge status: Home / Self Care | Payer: Medicare Other | Attending: Family Medicine | Admitting: Family Medicine

## 2023-06-02 DIAGNOSIS — I6529 Occlusion and stenosis of unspecified carotid artery: Secondary | ICD-10-CM | POA: Insufficient documentation

## 2023-06-02 DIAGNOSIS — I6522 Occlusion and stenosis of left carotid artery: Secondary | ICD-10-CM

## 2023-06-02 DIAGNOSIS — I639 Cerebral infarction, unspecified: Secondary | ICD-10-CM

## 2023-06-02 DIAGNOSIS — G459 Transient cerebral ischemic attack, unspecified: Secondary | ICD-10-CM

## 2023-06-02 LAB — ECHOCARDIOGRAM COMPLETE
AR max vel: 2.29 cm2
AV Area VTI: 2.32 cm2
AV Area mean vel: 2.49 cm2
AV Mean grad: 3 mm[Hg]
AV Peak grad: 6.5 mm[Hg]
Ao pk vel: 1.27 m/s
Area-P 1/2: 3.39 cm2
Calc EF: 54.4 %
Height: 62 in
MV VTI: 2.29 cm2
S' Lateral: 3.4 cm
Single Plane A2C EF: 54 %
Single Plane A4C EF: 51.8 %
Weight: 2608 [oz_av]

## 2023-06-02 LAB — LIPID PANEL
Cholesterol: 129 mg/dL (ref 0–200)
HDL: 40 mg/dL — ABNORMAL LOW (ref >40)
LDL Cholesterol: 49 mg/dL (ref 0–99)
Total CHOL/HDL Ratio: 3.2 {ratio}
Triglycerides: 199 mg/dL — ABNORMAL HIGH (ref <150)
VLDL: 40 mg/dL (ref 0–40)

## 2023-06-02 MED ORDER — ASPIRIN 81 MG PO TBEC: 162.0000 mg | DELAYED_RELEASE_TABLET | Freq: Every day | ORAL | Status: DC

## 2023-06-02 NOTE — Progress Notes (Signed)
SLP Cancellation Note  Patient Details Name: [name] MRN: [id number] DOB: 02/10/1946   Cancelled treatment:       Reason Eval/Treat Not Completed: SLP screened, no needs identified, will sign off   Spyros Winch 06/02/2023, 7:54 AM

## 2023-06-02 NOTE — H&P (View-Only) (Signed)
Hospital Consult    Reason for Consult:  Left Carotid Stenosis with CVA Requesting Physician:  Dr Shonna Chock MD  MRN #:  [id number]  History of Present Illness: This is a 78 y.o. female  with medical history significant of CVA, coronary artery disease, paroxysmal SVT status post loop recorder, hypertension, hyperlipidemia presenting with TIA.  Patient reports acute onset of right upper extremity weakness since around 8 AM yesterday.  Noted prior history of CVA 2018 with chronic right upper extremity weakness.  There was an acute worsening today.  On aspirin and Plavix.   On exam today the patient is resting comfortably sitting in a bedside chair eating lunch.  Patient's daughter is by her side.  Patient endorses that all of her symptoms have resolved at this point in time.  She denies any pain shortness of breath chest pain or weakness to any of her extremities.  Initially she had some gait issues but she is walking back and forth to the bathroom and in the hall without any gait issues.  Vascular surgery was consulted to evaluate the patient's MRI and carotid stenosis.  Past Medical History:  Diagnosis Date   Acute ischemic left MCA stroke (HCC) 04/11/2016   a.) small LEFT frontal MCA territory infarct   Anxiety    Arthritis    Asthma    B12 deficiency    Carotid stenosis, bilateral    a.) Carotid doppler 04/10/2016: mild (1-49%) BILATERAL ICAs.   Chronic low back pain    Colon polyp    Coronary artery disease 2011   a.) LHC 2011: 50% mLAD; no intervention opting for med mgmt. b.) LHC 06/04/2020: 70% p-mLAD, 90% oD1, 70% RI, 70-80% Cx marginal branch, 60% RCA; anatomy not ideally suited for PCI --> CVTS consult for CABG consideration recommended for refractory symptoms.   Depression    Diastolic dysfunction    a.)  TTE 04/11/2016: EF 60%; no RWMAs; mild MR; mild LA dilation; G1DD.   Female cystocele    GERD (gastroesophageal reflux disease)    Hyperlipidemia    Hypertension     Hypothyroidism    Incontinence in female    Long term current use of antithrombotics/antiplatelets    a.) DAPT therapy (ASA + clopidogrel)   OAB (overactive bladder)    Obesity    Paresthesias in left hand    PSVT (paroxysmal supraventricular tachycardia) (HCC)    Tachycardia 2010   TIA (transient ischemic attack)    Vitamin D deficiency     Past Surgical History:  Procedure Laterality Date   ABDOMINAL HYSTERECTOMY     CARDIAC CATHETERIZATION  05/16/2009   50% to the mid LAD lesion   CARPAL TUNNEL RELEASE Bilateral    CERVICAL FUSION     COLONOSCOPY WITH PROPOFOL N/A 05/21/2021   Procedure: COLONOSCOPY WITH PROPOFOL;  Surgeon: Regis Bill, MD;  Location: [facility];  Service: Endoscopy;  Laterality: N/A;  PLAVIX   KNEE ARTHROSCOPY Left    PARTIAL HYSTERECTOMY  05/16/1997   REVERSE SHOULDER ARTHROPLASTY Right 08/17/2021   Procedure: REVERSE SHOULDER ARTHROPLASTY WITH BICEPS TENODESIS;  Surgeon: Christena Flake, MD;  Location: [facility] ORS;  Service: Orthopedics;  Laterality: Right;   TEE WITHOUT CARDIOVERSION N/A 06/13/2016   Procedure: TRANSESOPHAGEAL ECHOCARDIOGRAM (TEE);  Surgeon: Iran Ouch, MD;  Location: [facility] ORS;  Service: Cardiovascular;  Laterality: N/A;   TONSILLECTOMY     TUBAL LIGATION      Allergies  Allergen Reactions   Doxycycline     Angioedema +  itching    Sulfa Antibiotics Hives    Welps   Gemtesa [Vibegron] Diarrhea    Prior to Admission medications   Medication Sig Start Date End Date Taking? Authorizing Provider  acetaminophen (TYLENOL) 325 MG tablet Take 2 tablets (650 mg total) by mouth every 6 (six) hours as needed for mild pain (or Fever >/= 101). 06/13/16  Yes [name], Deanna Artis, MD  aspirin EC 81 MG EC tablet Take 1 tablet (81 mg total) by mouth daily. 06/14/16  Yes [name], Deanna Artis, MD  azelastine (ASTELIN) 0.1 % nasal spray Place 1 spray into both nostrils 2 (two) times daily as needed for rhinitis or allergies. Use in each nostril as directed    Yes [provider]  clopidogrel (PLAVIX) 75 MG tablet Take 1 tablet (75 mg total) by mouth daily. 03/13/17  Yes [name], Ryan M, PA-C  DILT-XR 240 MG 24 hr capsule Take 1 capsule by mouth daily. 02/21/22  Yes [provider]  docusate sodium (COLACE) 100 MG capsule Take 100 mg by mouth daily.   Yes [provider]  estradiol (ESTRACE VAGINAL) 0.1 MG/GM vaginal cream Apply 0.5mg  (pea-sized amount)  just inside the vaginal introitus with a finger-tip on Monday, Wednesday and Friday nights. Patient taking differently: Place 1 Applicatorful vaginally daily as needed. Apply 0.5mg  (pea-sized amount)  just inside the vaginal introitus with a finger-tip 03/16/23  Yes [name], Carollee Herter A, PA-C  estradiol (ESTRACE) 0.5 MG tablet Take 0.5 mg by mouth daily.   Yes [provider]  EUTHYROX 25 MCG tablet Take 25 mcg by mouth daily before breakfast. 11/04/20  Yes [provider]  famotidine (PEPCID) 40 MG tablet Take 40 mg by mouth every morning.   Yes [provider]  fluticasone-salmeterol (ADVAIR) 250-50 MCG/ACT AEPB Inhale 1 puff into the lungs 2 (two) times daily as needed (asthma).   Yes [provider]  gabapentin (NEURONTIN) 300 MG capsule Take 300 mg by mouth at bedtime. 05/19/23  Yes [provider]  isosorbide mononitrate (IMDUR) 60 MG 24 hr tablet Take 60 mg by mouth every evening. 10/27/20  Yes [provider]  lisinopril (ZESTRIL) 10 MG tablet Take 20 mg by mouth every evening.   Yes [provider]  loratadine (CLARITIN) 10 MG tablet Take 10 mg by mouth every morning.   Yes [provider]  metoprolol succinate (TOPROL-XL) 100 MG 24 hr tablet Take 100 mg by mouth every evening. Take with or immediately following a meal.   Yes [provider]  Multiple Vitamin (MULTIVITAMIN) tablet Take 1 tablet by mouth daily.   Yes [provider]  Naphazoline-Pheniramine (EQ EYE ALLERGY RELIEF) 0.027-0.315  % SOLN Place 1 drop into both eyes daily as needed (eye allergies).   Yes [provider]  rosuvastatin (CRESTOR) 40 MG tablet Take 40 mg by mouth every evening.   Yes [provider]  sertraline (ZOLOFT) 50 MG tablet Take 25 mg by mouth every evening. 01/12/21  Yes [provider]  vitamin C (ASCORBIC ACID) 500 MG tablet Take 500 mg by mouth daily.   Yes [provider]    Social History   Socioeconomic History   Marital status: Single    Spouse name: Not on file   Number of children: Not on file   Years of education: Not on file   Highest education level: Not on file  Occupational History   Not on file  Tobacco Use   Smoking status: Never    Passive exposure:  Never   Smokeless tobacco: Never  Vaping Use   Vaping status: Never Used  Substance and Sexual Activity   Alcohol use: No   Drug use: No   Sexual activity: Not on file  Other Topics Concern   Not on file  Social History Narrative   Not on file   Social Drivers of Health   Financial Resource Strain: Low Risk  (01/23/2023)   Received from [facility]   Overall Financial Resource Strain (CARDIA)    Difficulty of Paying Living Expenses: Not hard at all  Food Insecurity: No Food Insecurity (06/02/2023)   Hunger Vital Sign    Worried About Running Out of Food in the Last Year: Never true    Ran Out of Food in the Last Year: Never true  Transportation Needs: No Transportation Needs (06/02/2023)   PRAPARE - Administrator, Civil Service (Medical): No    Lack of Transportation (Non-Medical): No  Physical Activity: Unknown (03/16/2017)   Received from [facility], [facility]   Exercise Vital Sign    Days of Exercise per Week: Patient declined    Minutes of Exercise per Session: Patient declined  Stress: Stress Concern Present (03/16/2017)   Received from [facility], [facility]   [employer] of Occupational Health - Occupational Stress Questionnaire    Feeling of Stress : To some extent  Social Connections: Unknown (06/02/2023)   Social Connection and Isolation Panel [NHANES]    Frequency of Communication with Friends and Family: Three times a week    Frequency of Social Gatherings with Friends and Family: Three times a week    Attends Religious Services: Patient declined    Active Member of Clubs or Organizations: Patient declined    Attends Banker Meetings: Patient declined    Marital Status: Patient declined  Intimate Partner Violence: Not At Risk (06/02/2023)   Humiliation, Afraid, Rape, and Kick questionnaire    Fear of Current or Ex-Partner: No    Emotionally Abused: No    Physically Abused: No    Sexually Abused: No     Family History  Problem Relation Age of Onset   Hypertension Mother    Heart attack Father 74       MI   Heart disease Father    Hypertension Father    Hypertension Sister    Hyperlipidemia Sister    Heart attack Brother        cardiac arrest   Hypertension Sister    Hyperlipidemia Sister    Breast cancer Neg Hx     ROS: Otherwise negative unless mentioned in HPI  Physical Examination  Vitals:   06/02/23 0358 06/02/23 0752  BP: 131/87 (!) 147/61  Pulse: 65 66  Resp: 18 16  Temp: 98.3 F (36.8 C) 98.1 F (36.7 C)  SpO2: 100% 98%   Body mass index is 29.81 kg/m.  General:  WDWN in NAD Gait: Not observed HENT: WNL, normocephalic Pulmonary: normal non-labored breathing, without Rales, rhonchi,  wheezing Cardiac: regular, without  Murmurs, rubs or gallops; without carotid bruits Abdomen: Positive bowel sounds,  soft, NT/ND, no masses Skin: without rashes Vascular Exam/Pulses: Palpable Pulses throughout. Extremities: without ischemic changes, without Gangrene , without cellulitis; without open wounds;  Musculoskeletal: no muscle wasting or atrophy  Neurologic: A&O X 3;  No focal weakness or paresthesias  are detected; speech is fluent/normal Psychiatric:  The pt has Normal affect. Lymph:  Unremarkable  CBC    Component Value Date/Time   WBC 3.9 (L) 06/01/2023 0826   RBC 4.87 06/01/2023 0826   HGB 14.5 06/01/2023 0826   HGB 15.3 05/02/2012 1255   HCT 41.7 06/01/2023 0826   HCT 43.8 05/02/2012 1255   PLT 251 06/01/2023 0826   PLT 329 05/02/2012 1255   MCV 85.6 06/01/2023 0826   MCV 83 05/02/2012 1255   MCH 29.8 06/01/2023 0826   MCHC 34.8 06/01/2023 0826   RDW 13.2 06/01/2023 0826   RDW 12.7 05/02/2012 1255   LYMPHSABS 1.2 06/01/2023 0826   MONOABS 0.4 06/01/2023 0826   EOSABS 0.2 06/01/2023 0826   BASOSABS 0.0 06/01/2023 0826    BMET    Component Value Date/Time   NA 136 06/01/2023 0826   NA 140 05/02/2012 1255   K 3.8 06/01/2023 0826   K 3.3 (L) 05/02/2012 1255   CL 102 06/01/2023 0826   CL 107 05/02/2012 1255   CO2 24 06/01/2023 0826   CO2 26 05/02/2012 1255   GLUCOSE 109 (H) 06/01/2023 0826   GLUCOSE 122 (H) 05/02/2012 1255   BUN 15 06/01/2023 0826   BUN 13 05/02/2012 1255   CREATININE 0.90 06/01/2023 0826   CREATININE 0.67 05/02/2012 1255   CALCIUM 9.5 06/01/2023 0826   CALCIUM 9.1 05/02/2012 1255   GFRNONAA >60 06/01/2023 0826   GFRNONAA >60 05/02/2012 1255   GFRAA 55 (L) 11/18/2019 1310   GFRAA >60 05/02/2012 1255    COAGS: Lab Results  Component Value Date   INR 1.0 06/01/2023   INR 0.91 06/09/2016   INR 0.92 04/10/2016     Non-Invasive Vascular Imaging:   EXAM:06/01/23 MRI HEAD WITHOUT CONTRAST   TECHNIQUE: Multiplanar, multiecho pulse sequences of the brain and surrounding structures were obtained without intravenous contrast.   COMPARISON:  Head CT June 01, 2023.  MRI brain June 09, 2016.   FINDINGS: Brain: Small focus of restricted diffusion in the cortex of the left precentral gyrus. No hemorrhage, hydrocephalus, extra-axial collection or mass lesion. Remote small infarcts in the left precentral gyrus cortex, left centrum  semiovale and right basal ganglia. A few scattered foci of T2 hyperintensity are seen within the white matter of the cerebral hemispheres, nonspecific.   Enlarged, partially empty sella.   Vascular: Normal flow voids.   Skull and upper cervical spine: Degenerative changes of the cervical spine with at least mild spinal canal stenosis at C4-5. Postsurgical changes from prior C5-C6 ACDF.   Sinuses/Orbits: Mucosal thickening of the bilateral ethmoid cells and maxillary sinuses with a mucous retention cyst in the left maxillary sinus. Chronic inflammatory changes of the right maxillary sinus which has decreased volume. The orbits are maintained.   Other: None.   IMPRESSION: 1. Small acute infarct in the cortex of the left precentral gyrus. 2. Remote small infarcts in the left precentral gyrus cortex, left centrum semiovale and right basal ganglia. 3. Mild chronic microvascular ischemic changes of the white matter.    Statin:  Yes.   Beta Blocker:  Yes.   Aspirin:  Yes.   ACEI:  Yes.   ARB:  No. CCB use:  Yes Other antiplatelets/anticoagulants:  Yes.   Plavix 75 mg Daily    ASSESSMENT/PLAN: This is a 78 y.o. female who presents to [facility] Emergency room with left arm weakness and numbness. Upon workup she underwent an MRI of the head without contrast showing a small acute infarct in the cortex of the left precentral gyrus. Patient's symptoms have resolved  since presenting to the emergency department.   PLAN After reviewing the patients imaging with Dr Vilinda Flake MD  Vascular surgery is planning endovascular left carotid stent placement as outpatient next Wednesday 06/07/2023. Okay per vascular surgery for the patient to be discharged today. We ask the patient to take 162 mg of ASA PO Daily and Plavix 75 mg daily prior to procedure. I informed the patient of the recommended medication changes. Both Patient and family verbalized their understanding. They were informed not to stop the  medication prior to procedure.    -I discussed the plan in detail with Dr Levora Dredge MD and he agrees with the plan.    Marcie [facility] 06/02/2023 12:07 PM

## 2023-06-02 NOTE — Plan of Care (Signed)
Problem: Education: Goal: Knowledge of disease or condition will improve 06/02/2023 0506 by Jorge Ny, RN Outcome: Progressing 06/02/2023 0251 by Jorge Ny, RN Outcome: Progressing Goal: Knowledge of secondary prevention will improve (MUST DOCUMENT ALL) 06/02/2023 0506 by Jorge Ny, RN Outcome: Progressing 06/02/2023 0251 by Jorge Ny, RN Outcome: Progressing Goal: Knowledge of patient specific risk factors will improve (DELETE if not current risk factor) 06/02/2023 0506 by Jorge Ny, RN Outcome: Progressing 06/02/2023 0251 by Jorge Ny, RN Outcome: Progressing   Problem: Ischemic Stroke/TIA Tissue Perfusion: Goal: Complications of ischemic stroke/TIA will be minimized 06/02/2023 0506 by Jorge Ny, RN Outcome: Progressing 06/02/2023 0251 by Jorge Ny, RN Outcome: Progressing   Problem: Coping: Goal: Will verbalize positive feelings about self 06/02/2023 0506 by Jorge Ny, RN Outcome: Progressing 06/02/2023 0251 by Jorge Ny, RN Outcome: Progressing Goal: Will identify appropriate support needs 06/02/2023 0506 by Jorge Ny, RN Outcome: Progressing 06/02/2023 0251 by Jorge Ny, RN Outcome: Progressing   Problem: Health Behavior/Discharge Planning: Goal: Ability to manage health-related needs will improve 06/02/2023 0506 by Jorge Ny, RN Outcome: Progressing 06/02/2023 0251 by Jorge Ny, RN Outcome: Progressing Goal: Goals will be collaboratively established with patient/family 06/02/2023 0506 by Jorge Ny, RN Outcome: Progressing 06/02/2023 0251 by Jorge Ny, RN Outcome: Progressing   Problem: Self-Care: Goal: Ability to participate in self-care as condition permits will improve 06/02/2023 0506 by Jorge Ny, RN Outcome: Progressing 06/02/2023 0251 by Jorge Ny, RN Outcome: Progressing Goal: Verbalization of feelings and concerns over  difficulty with self-care will improve 06/02/2023 0506 by Jorge Ny, RN Outcome: Progressing 06/02/2023 0251 by Jorge Ny, RN Outcome: Progressing Goal: Ability to communicate needs accurately will improve 06/02/2023 0506 by Jorge Ny, RN Outcome: Progressing 06/02/2023 0251 by Jorge Ny, RN Outcome: Progressing   Problem: Nutrition: Goal: Risk of aspiration will decrease 06/02/2023 0506 by Jorge Ny, RN Outcome: Progressing 06/02/2023 0251 by Jorge Ny, RN Outcome: Progressing Goal: Dietary intake will improve 06/02/2023 0506 by Jorge Ny, RN Outcome: Progressing 06/02/2023 0251 by Jorge Ny, RN Outcome: Progressing   Problem: Education: Goal: Knowledge of General Education information will improve Description: Including pain rating scale, medication(s)/side effects and non-pharmacologic comfort measures 06/02/2023 0506 by Jorge Ny, RN Outcome: Progressing 06/02/2023 0251 by Jorge Ny, RN Outcome: Progressing   Problem: Health Behavior/Discharge Planning: Goal: Ability to manage health-related needs will improve 06/02/2023 0506 by Jorge Ny, RN Outcome: Progressing 06/02/2023 0251 by Jorge Ny, RN Outcome: Progressing   Problem: Clinical Measurements: Goal: Ability to maintain clinical measurements within normal limits will improve 06/02/2023 0506 by Jorge Ny, RN Outcome: Progressing 06/02/2023 0251 by Jorge Ny, RN Outcome: Progressing Goal: Will remain free from infection 06/02/2023 0506 by Jorge Ny, RN Outcome: Progressing 06/02/2023 0251 by Jorge Ny, RN Outcome: Progressing Goal: Diagnostic test results will improve 06/02/2023 0506 by Jorge Ny, RN Outcome: Progressing 06/02/2023 0251 by Jorge Ny, RN Outcome: Progressing Goal: Respiratory complications will improve 06/02/2023 0506 by Jorge Ny, RN Outcome:  Progressing 06/02/2023 0251 by Jorge Ny, RN Outcome: Progressing Goal: Cardiovascular complication will be avoided 06/02/2023 0506 by Jorge Ny, RN Outcome: Progressing 06/02/2023 0251 by Jorge Ny, RN Outcome: Progressing   Problem: Activity: Goal: Risk for activity intolerance will decrease 06/02/2023 0506 by Jorge Ny, RN Outcome: Progressing 06/02/2023 0251 by  Jorge Ny, RN Outcome: Progressing   Problem: Nutrition: Goal: Adequate nutrition will be maintained 06/02/2023 0506 by Jorge Ny, RN Outcome: Progressing 06/02/2023 0251 by Jorge Ny, RN Outcome: Progressing   Problem: Coping: Goal: Level of anxiety will decrease 06/02/2023 0506 by Jorge Ny, RN Outcome: Progressing 06/02/2023 0251 by Jorge Ny, RN Outcome: Progressing   Problem: Elimination: Goal: Will not experience complications related to bowel motility 06/02/2023 0506 by Jorge Ny, RN Outcome: Progressing 06/02/2023 0251 by Jorge Ny, RN Outcome: Progressing Goal: Will not experience complications related to urinary retention 06/02/2023 0506 by Jorge Ny, RN Outcome: Progressing 06/02/2023 0251 by Jorge Ny, RN Outcome: Progressing   Problem: Pain Managment: Goal: General experience of comfort will improve and/or be controlled 06/02/2023 0506 by Jorge Ny, RN Outcome: Progressing 06/02/2023 0251 by Jorge Ny, RN Outcome: Progressing   Problem: Safety: Goal: Ability to remain free from injury will improve 06/02/2023 0506 by Jorge Ny, RN Outcome: Progressing 06/02/2023 0251 by Jorge Ny, RN Outcome: Progressing   Problem: Skin Integrity: Goal: Risk for impaired skin integrity will decrease 06/02/2023 0506 by Jorge Ny, RN Outcome: Progressing 06/02/2023 0251 by Jorge Ny, RN Outcome: Progressing

## 2023-06-02 NOTE — Progress Notes (Signed)
*  PRELIMINARY RESULTS* Echocardiogram 2D Echocardiogram has been performed.  [name] 06/02/2023, 10:24 AM

## 2023-06-02 NOTE — Discharge Instructions (Signed)
Resume your lisinopril in 3 days.

## 2023-06-02 NOTE — Plan of Care (Signed)

## 2023-06-02 NOTE — Consult Note (Signed)
Hospital Consult    Reason for Consult:  Left Carotid Stenosis with CVA Requesting Physician:  Dr Shonna Chock MD  MRN #:  [id number]  History of Present Illness: This is a 78 y.o. female  with medical history significant of CVA, coronary artery disease, paroxysmal SVT status post loop recorder, hypertension, hyperlipidemia presenting with TIA.  Patient reports acute onset of right upper extremity weakness since around 8 AM yesterday.  Noted prior history of CVA 2018 with chronic right upper extremity weakness.  There was an acute worsening today.  On aspirin and Plavix.   On exam today the patient is resting comfortably sitting in a bedside chair eating lunch.  Patient's daughter is by her side.  Patient endorses that all of her symptoms have resolved at this point in time.  She denies any pain shortness of breath chest pain or weakness to any of her extremities.  Initially she had some gait issues but she is walking back and forth to the bathroom and in the hall without any gait issues.  Vascular surgery was consulted to evaluate the patient's MRI and carotid stenosis.  Past Medical History:  Diagnosis Date   Acute ischemic left MCA stroke (HCC) 04/11/2016   a.) small LEFT frontal MCA territory infarct   Anxiety    Arthritis    Asthma    B12 deficiency    Carotid stenosis, bilateral    a.) Carotid doppler 04/10/2016: mild (1-49%) BILATERAL ICAs.   Chronic low back pain    Colon polyp    Coronary artery disease 2011   a.) LHC 2011: 50% mLAD; no intervention opting for med mgmt. b.) LHC 06/04/2020: 70% p-mLAD, 90% oD1, 70% RI, 70-80% Cx marginal branch, 60% RCA; anatomy not ideally suited for PCI --> CVTS consult for CABG consideration recommended for refractory symptoms.   Depression    Diastolic dysfunction    a.)  TTE 04/11/2016: EF 60%; no RWMAs; mild MR; mild LA dilation; G1DD.   Female cystocele    GERD (gastroesophageal reflux disease)    Hyperlipidemia    Hypertension     Hypothyroidism    Incontinence in female    Long term current use of antithrombotics/antiplatelets    a.) DAPT therapy (ASA + clopidogrel)   OAB (overactive bladder)    Obesity    Paresthesias in left hand    PSVT (paroxysmal supraventricular tachycardia) (HCC)    Tachycardia 2010   TIA (transient ischemic attack)    Vitamin D deficiency     Past Surgical History:  Procedure Laterality Date   ABDOMINAL HYSTERECTOMY     CARDIAC CATHETERIZATION  05/16/2009   50% to the mid LAD lesion   CARPAL TUNNEL RELEASE Bilateral    CERVICAL FUSION     COLONOSCOPY WITH PROPOFOL N/A 05/21/2021   Procedure: COLONOSCOPY WITH PROPOFOL;  Surgeon: Regis Bill, MD;  Location: [facility];  Service: Endoscopy;  Laterality: N/A;  PLAVIX   KNEE ARTHROSCOPY Left    PARTIAL HYSTERECTOMY  05/16/1997   REVERSE SHOULDER ARTHROPLASTY Right 08/17/2021   Procedure: REVERSE SHOULDER ARTHROPLASTY WITH BICEPS TENODESIS;  Surgeon: Christena Flake, MD;  Location: [facility] OR[facility];  Service: Orthopedics;  Laterality: Right;   TEE WITHOUT CARDIOVERSION N/A 06/13/2016   Procedure: TRANSESOPHAGEAL ECHOCARDIOGRAM (TEE);  Surgeon: Iran Ouch, MD;  Location: [facility] ORS;  Service: Cardiovascular;  Laterality: N/A;   TONSILLECTOMY     TUBAL LIGATION      Allergies  Allergen Reactions   Doxycycline     Angioedema +  itching    Sulfa Antibiotics Hives    Welps   Gemtesa [Vibegron] Diarrhea    Prior to Admission medications   Medication Sig Start Date End Date Taking? Authorizing Provider  acetaminophen (TYLENOL) 325 MG tablet Take 2 tablets (650 mg total) by mouth every 6 (six) hours as needed for mild pain (or Fever >/= 101). 06/13/16  Yes [name], Deanna Artis, MD  aspirin EC 81 MG EC tablet Take 1 tablet (81 mg total) by mouth daily. 06/14/16  Yes [name], Deanna Artis, MD  azelastine (ASTELIN) 0.1 % nasal spray Place 1 spray into both nostrils 2 (two) times daily as needed for rhinitis or allergies. Use in each nostril as directed    Yes [provider]  clopidogrel (PLAVIX) 75 MG tablet Take 1 tablet (75 mg total) by mouth daily. 03/13/17  Yes [name], Ryan M, PA-C  DILT-XR 240 MG 24 hr capsule Take 1 capsule by mouth daily. 02/21/22  Yes [provider]  docusate sodium (COLACE) 100 MG capsule Take 100 mg by mouth daily.   Yes [provider]  estradiol (ESTRACE VAGINAL) 0.1 MG/GM vaginal cream Apply 0.5mg  (pea-sized amount)  just inside the vaginal introitus with a finger-tip on Monday, Wednesday and Friday nights. Patient taking differently: Place 1 Applicatorful vaginally daily as needed. Apply 0.5mg  (pea-sized amount)  just inside the vaginal introitus with a finger-tip 03/16/23  Yes [name], Carollee Herter A, PA-C  estradiol (ESTRACE) 0.5 MG tablet Take 0.5 mg by mouth daily.   Yes [provider]  EUTHYROX 25 MCG tablet Take 25 mcg by mouth daily before breakfast. 11/04/20  Yes [provider]  famotidine (PEPCID) 40 MG tablet Take 40 mg by mouth every morning.   Yes [provider]  fluticasone-salmeterol (ADVAIR) 250-50 MCG/ACT AEPB Inhale 1 puff into the lungs 2 (two) times daily as needed (asthma).   Yes [provider]  gabapentin (NEURONTIN) 300 MG capsule Take 300 mg by mouth at bedtime. 05/19/23  Yes [provider]  isosorbide mononitrate (IMDUR) 60 MG 24 hr tablet Take 60 mg by mouth every evening. 10/27/20  Yes [provider]  lisinopril (ZESTRIL) 10 MG tablet Take 20 mg by mouth every evening.   Yes [provider]  loratadine (CLARITIN) 10 MG tablet Take 10 mg by mouth every morning.   Yes [provider]  metoprolol succinate (TOPROL-XL) 100 MG 24 hr tablet Take 100 mg by mouth every evening. Take with or immediately following a meal.   Yes [provider]  Multiple Vitamin (MULTIVITAMIN) tablet Take 1 tablet by mouth daily.   Yes [provider]  Naphazoline-Pheniramine (EQ EYE ALLERGY RELIEF) 0.027-0.315  % SOLN Place 1 drop into both eyes daily as needed (eye allergies).   Yes [provider]  rosuvastatin (CRESTOR) 40 MG tablet Take 40 mg by mouth every evening.   Yes [provider]  sertraline (ZOLOFT) 50 MG tablet Take 25 mg by mouth every evening. 01/12/21  Yes [provider]  vitamin C (ASCORBIC ACID) 500 MG tablet Take 500 mg by mouth daily.   Yes [provider]    Social History   Socioeconomic History   Marital status: Single    Spouse name: Not on file   Number of children: Not on file   Years of education: Not on file   Highest education level: Not on file  Occupational History   Not on file  Tobacco Use   Smoking status: Never    Passive exposure:  Never   Smokeless tobacco: Never  Vaping Use   Vaping status: Never Used  Substance and Sexual Activity   Alcohol use: No   Drug use: No   Sexual activity: Not on file  Other Topics Concern   Not on file  Social History Narrative   Not on file   Social Drivers of Health   Financial Resource Strain: Low Risk  (01/23/2023)   Received from [facility]   Overall Financial Resource Strain (CARDIA)    Difficulty of Paying Living Expenses: Not hard at all  Food Insecurity: No Food Insecurity (06/02/2023)   Hunger Vital Sign    Worried About Running Out of Food in the Last Year: Never true    Ran Out of Food in the Last Year: Never true  Transportation Needs: No Transportation Needs (06/02/2023)   PRAPARE - Administrator, Civil Service (Medical): No    Lack of Transportation (Non-Medical): No  Physical Activity: Unknown (03/16/2017)   Received from [facility], [facility]   Exercise Vital Sign    Days of Exercise per Week: Patient declined    Minutes of Exercise per Session: Patient declined  Stress: Stress Concern Present (03/16/2017)   Received from [facility], [facility]   [employer] of Occupational Health - Occupational Stress Questionnaire    Feeling of Stress : To some extent  Social Connections: Unknown (06/02/2023)   Social Connection and Isolation Panel [NHANES]    Frequency of Communication with Friends and Family: Three times a week    Frequency of Social Gatherings with Friends and Family: Three times a week    Attends Religious Services: Patient declined    Active Member of Clubs or Organizations: Patient declined    Attends Banker Meetings: Patient declined    Marital Status: Patient declined  Intimate Partner Violence: Not At Risk (06/02/2023)   Humiliation, Afraid, Rape, and Kick questionnaire    Fear of Current or Ex-Partner: No    Emotionally Abused: No    Physically Abused: No    Sexually Abused: No     Family History  Problem Relation Age of Onset   Hypertension Mother    Heart attack Father 74       MI   Heart disease Father    Hypertension Father    Hypertension Sister    Hyperlipidemia Sister    Heart attack Brother        cardiac arrest   Hypertension Sister    Hyperlipidemia Sister    Breast cancer Neg Hx     ROS: Otherwise negative unless mentioned in HPI  Physical Examination  Vitals:   06/02/23 0358 06/02/23 0752  BP: 131/87 (!) 147/61  Pulse: 65 66  Resp: 18 16  Temp: 98.3 F (36.8 C) 98.1 F (36.7 C)  SpO2: 100% 98%   Body mass index is 29.81 kg/m.  General:  WDWN in NAD Gait: Not observed HENT: WNL, normocephalic Pulmonary: normal non-labored breathing, without Rales, rhonchi,  wheezing Cardiac: regular, without  Murmurs, rubs or gallops; without carotid bruits Abdomen: Positive bowel sounds,  soft, NT/ND, no masses Skin: without rashes Vascular Exam/Pulses: Palpable Pulses throughout. Extremities: without ischemic changes, without Gangrene , without cellulitis; without open wounds;  Musculoskeletal: no muscle wasting or atrophy  Neurologic: A&O X 3;  No focal weakness or paresthesias  are detected; speech is fluent/normal Psychiatric:  The pt has Normal affect. Lymph:  Unremarkable  CBC    Component Value Date/Time   WBC 3.9 (L) 06/01/2023 0826   RBC 4.87 06/01/2023 0826   HGB 14.5 06/01/2023 0826   HGB 15.3 05/02/2012 1255   HCT 41.7 06/01/2023 0826   HCT 43.8 05/02/2012 1255   PLT 251 06/01/2023 0826   PLT 329 05/02/2012 1255   MCV 85.6 06/01/2023 0826   MCV 83 05/02/2012 1255   MCH 29.8 06/01/2023 0826   MCHC 34.8 06/01/2023 0826   RDW 13.2 06/01/2023 0826   RDW 12.7 05/02/2012 1255   LYMPHSABS 1.2 06/01/2023 0826   MONOABS 0.4 06/01/2023 0826   EOSABS 0.2 06/01/2023 0826   BASOSABS 0.0 06/01/2023 0826    BMET    Component Value Date/Time   NA 136 06/01/2023 0826   NA 140 05/02/2012 1255   K 3.8 06/01/2023 0826   K 3.3 (L) 05/02/2012 1255   CL 102 06/01/2023 0826   CL 107 05/02/2012 1255   CO2 24 06/01/2023 0826   CO2 26 05/02/2012 1255   GLUCOSE 109 (H) 06/01/2023 0826   GLUCOSE 122 (H) 05/02/2012 1255   BUN 15 06/01/2023 0826   BUN 13 05/02/2012 1255   CREATININE 0.90 06/01/2023 0826   CREATININE 0.67 05/02/2012 1255   CALCIUM 9.5 06/01/2023 0826   CALCIUM 9.1 05/02/2012 1255   GFRNONAA >60 06/01/2023 0826   GFRNONAA >60 05/02/2012 1255   GFRAA 55 (L) 11/18/2019 1310   GFRAA >60 05/02/2012 1255    COAGS: Lab Results  Component Value Date   INR 1.0 06/01/2023   INR 0.91 06/09/2016   INR 0.92 04/10/2016     Non-Invasive Vascular Imaging:   EXAM:06/01/23 MRI HEAD WITHOUT CONTRAST   TECHNIQUE: Multiplanar, multiecho pulse sequences of the brain and surrounding structures were obtained without intravenous contrast.   COMPARISON:  Head CT June 01, 2023.  MRI brain June 09, 2016.   FINDINGS: Brain: Small focus of restricted diffusion in the cortex of the left precentral gyrus. No hemorrhage, hydrocephalus, extra-axial collection or mass lesion. Remote small infarcts in the left precentral gyrus cortex, left centrum  semiovale and right basal ganglia. A few scattered foci of T2 hyperintensity are seen within the white matter of the cerebral hemispheres, nonspecific.   Enlarged, partially empty sella.   Vascular: Normal flow voids.   Skull and upper cervical spine: Degenerative changes of the cervical spine with at least mild spinal canal stenosis at C4-5. Postsurgical changes from prior C5-C6 ACDF.   Sinuses/Orbits: Mucosal thickening of the bilateral ethmoid cells and maxillary sinuses with a mucous retention cyst in the left maxillary sinus. Chronic inflammatory changes of the right maxillary sinus which has decreased volume. The orbits are maintained.   Other: None.   IMPRESSION: 1. Small acute infarct in the cortex of the left precentral gyrus. 2. Remote small infarcts in the left precentral gyrus cortex, left centrum semiovale and right basal ganglia. 3. Mild chronic microvascular ischemic changes of the white matter.    Statin:  Yes.   Beta Blocker:  Yes.   Aspirin:  Yes.   ACEI:  Yes.   ARB:  No. CCB use:  Yes Other antiplatelets/anticoagulants:  Yes.   Plavix 75 mg Daily    ASSESSMENT/PLAN: This is a 78 y.o. female who presents to [facility] Emergency room with left arm weakness and numbness. Upon workup she underwent an MRI of the head without contrast showing a small acute infarct in the cortex of the left precentral gyrus. Patient's symptoms have resolved  since presenting to the emergency department.   PLAN After reviewing the patients imaging with Dr Vilinda Flake MD  Vascular surgery is planning endovascular left carotid stent placement as outpatient next Wednesday 06/07/2023. Okay per vascular surgery for the patient to be discharged today. We ask the patient to take 162 mg of ASA PO Daily and Plavix 75 mg daily prior to procedure. I informed the patient of the recommended medication changes. Both Patient and family verbalized their understanding. They were informed not to stop the  medication prior to procedure.    -I discussed the plan in detail with Dr Levora Dredge MD and he agrees with the plan.    Marcie Bal [facility] 06/02/2023 12:07 PM

## 2023-06-02 NOTE — Care Management CC44 (Deleted)
Condition Code 44 Documentation Completed  Patient Details  Name: Kimberly Trevino MRN: [id number] Date of Birth: January 29, 1946   Condition Code 44 given:   yes Patient signature on Condition Code 44 notice:   yes Documentation of 2 MD's agreement:  yes  Code 44 added to claim:   yes    Sausha Raymond W, CMA 06/02/2023, 10:15 AM

## 2023-06-02 NOTE — Discharge Summary (Signed)
Kimberly Trevino BPZ:[id number] DOB: January 29, 1946 DOA: 06/01/2023  PCP: Dortha Kern, MD  Admit date: 06/01/2023 Discharge date: 06/02/2023  Time spent: 35 minutes  Recommendations for Outpatient Follow-up:  Pcp f/u Vascular procedure next week as scheduled Neurology f/u     Discharge Diagnoses:  Principal Problem:   TIA (transient ischemic attack) Active Problems:   Coronary artery disease   PSVT (paroxysmal supraventricular tachycardia) (HCC)   Hyperlipidemia   Hypertension   CVA (cerebral vascular accident) (HCC)   Carotid artery stenosis   Discharge Condition: stable  Diet recommendation: heart healthy  Filed Weights   06/01/23 0822  Weight: 73.9 kg    History of present illness:  From admission h and p Kimberly Trevino is a 78 y.o. female with medical history significant of CVA, coronary artery disease, paroxysmal SVT status post loop recorder, hypertension, hyperlipidemia presenting with TIA.  Patient reports acute onset of right upper extremity weakness since around 8 AM today.  Noted prior history of CVA 2018 with chronic right upper extremity weakness.  There was an acute worsening today.  On aspirin and Plavix.  No reported recent medication changes.  Denies any alcohol or tobacco use.  Does admit to high salt diet.  Right extremity weakness persisted though transiently improved over the course of the morning.  No chest pain or shortness of breath.  No nausea or vomiting.  No focal hemiparesis.  Minimal confusion.   Hospital Course:  Patient with history CVA and CAD presents with right arm weakness/numbness, now resolved and back to her baseline. MRI shows small acute infarct in the cortex of the left precentral gyrus and CTA shows 65% stenosis left carotid bifurcation. Neurology and vascular surgery both consulted. From a stroke perspective patient will continue asa/statin, ldl is appropriate, will need f/u with neurology. No PT/OT needs identified. TTE  unremarkable, had previously underwent ILR monitoring with no events and this stroke is not strongly suggestive of embolic stroke, so further cardiac monitoring not felt to be necessary. For the carotid artery stenosis, vascular surgery advises increasing aspirin to 162 mg for the time being and have scheduled the patient for stent placement next week. We did advise the patient stop her hormone replacement therapy.  Procedures: none   Consultations: Neurology, vascular surgery  Discharge Exam: Vitals:   06/02/23 0752 06/02/23 1207  BP: (!) 147/61 125/78  Pulse: 66 80  Resp: 16 20  Temp: 98.1 F (36.7 C) 99 F (37.2 C)  SpO2: 98% 100%    General: NAD Cardiovascular: rrr Respiratory: ctab Neuro: cn 2-12 grossly intact, symmetric strength and sensation  Discharge Instructions   Discharge Instructions     Ambulatory referral to Neurology   Complete by: As directed    Diet - low sodium heart healthy   Complete by: As directed    Diet - low sodium heart healthy   Complete by: As directed    Increase activity slowly   Complete by: As directed    Increase activity slowly   Complete by: As directed       Allergies as of 06/02/2023       Reactions   Doxycycline    Angioedema + itching   Sulfa Antibiotics Hives   Welps   Gemtesa [vibegron] Diarrhea        Medication List     STOP taking these medications    estradiol 0.1 MG/GM vaginal cream Commonly known as: ESTRACE VAGINAL   estradiol 0.5 MG tablet Commonly known as: ESTRACE  TAKE these medications    acetaminophen 325 MG tablet Commonly known as: TYLENOL Take 2 tablets (650 mg total) by mouth every 6 (six) hours as needed for mild pain (or Fever >/= 101).   ascorbic acid 500 MG tablet Commonly known as: VITAMIN C Take 500 mg by mouth daily.   aspirin EC 81 MG tablet Take 2 tablets (162 mg total) by mouth daily. What changed: how much to take   azelastine 0.1 % nasal spray Commonly known  as: ASTELIN Place 1 spray into both nostrils 2 (two) times daily as needed for rhinitis or allergies. Use in each nostril as directed   clopidogrel 75 MG tablet Commonly known as: PLAVIX Take 1 tablet (75 mg total) by mouth daily.   Dilt-XR 240 MG 24 hr capsule Generic drug: diltiazem Take 1 capsule by mouth daily.   docusate sodium 100 MG capsule Commonly known as: COLACE Take 100 mg by mouth daily.   EQ Eye Allergy Relief 0.027-0.315 % Soln Generic drug: Naphazoline-Pheniramine Place 1 drop into both eyes daily as needed (eye allergies).   Euthyrox 25 MCG tablet Generic drug: levothyroxine Take 25 mcg by mouth daily before breakfast.   famotidine 40 MG tablet Commonly known as: PEPCID Take 40 mg by mouth every morning.   fluticasone-salmeterol 250-50 MCG/ACT Aepb Commonly known as: ADVAIR Inhale 1 puff into the lungs 2 (two) times daily as needed (asthma).   gabapentin 300 MG capsule Commonly known as: NEURONTIN Take 300 mg by mouth at bedtime.   isosorbide mononitrate 60 MG 24 hr tablet Commonly known as: IMDUR Take 60 mg by mouth every evening.   lisinopril 10 MG tablet Commonly known as: ZESTRIL Take 20 mg by mouth every evening.   loratadine 10 MG tablet Commonly known as: CLARITIN Take 10 mg by mouth every morning.   metoprolol succinate 100 MG 24 hr tablet Commonly known as: TOPROL-XL Take 100 mg by mouth every evening. Take with or immediately following a meal.   multivitamin tablet Take 1 tablet by mouth daily.   rosuvastatin 40 MG tablet Commonly known as: CRESTOR Take 40 mg by mouth every evening.   sertraline 50 MG tablet Commonly known as: ZOLOFT Take 25 mg by mouth every evening.       Allergies  Allergen Reactions   Doxycycline     Angioedema + itching    Sulfa Antibiotics Hives    Welps   Gemtesa [Vibegron] Diarrhea    Follow-up Information     Dortha Kern, MD Follow up.   Specialty: Family Medicine Contact  information: [address] [place] [address] [phone]         Schnier, Latina Craver, MD Follow up.   Specialties: Vascular Surgery, Cardiology, Radiology, Vascular Surgery Why: as scheduled for vascular surgery Contact information: [address] [place] [address] [phone]         Morene Crocker, MD Follow up.   Specialty: Neurology Contact information: [address] [facility] [place] [address] [phone]                  The results of significant diagnostics from this hospitalization (including imaging, microbiology, ancillary and laboratory) are listed below for reference.    Significant Diagnostic Studies: ECHOCARDIOGRAM COMPLETE Result Date: 06/02/2023    ECHOCARDIOGRAM REPORT   Patient Name:   Kimberly Trevino Date of Exam: 06/02/2023 Medical Rec #:  [id number]  Height:       62.0 in Accession #:    [id number]         Weight:       163.0 lb Date of Birth:  [birth year]          BSA:          1.753 m Patient Age:    77 years           BP:           147/61 mmHg Patient Gender: F                  HR:           70 bpm. Exam Location:  [facility] Procedure: 2D Echo, Cardiac Doppler and Color Doppler Indications:     TIA  History:         Patient has prior history of Echocardiogram examinations, most                  recent 06/13/2016. CAD, TIA and Stroke, Arrythmias:Tachycardia,                  Signs/Symptoms:Edema; Risk Factors:Hypertension and                  Dyslipidemia.  Sonographer:     Mikki Harbor Referring Phys:  [phone] Francoise Schaumann [place] Diagnosing Phys: Debbe Odea MD IMPRESSIONS  1. Left ventricular ejection fraction, by estimation, is 55 to 60%. Left ventricular ejection fraction by 2D MOD biplane is 54.4 %. The left ventricle has normal function. The left ventricle has no regional wall motion abnormalities. Left ventricular diastolic parameters are consistent with Grade I diastolic  dysfunction (impaired relaxation).  2. Right ventricular systolic function is normal. The right ventricular size is normal. There is normal pulmonary artery systolic pressure.  3. The mitral valve is degenerative. Mild mitral valve regurgitation.  4. The aortic valve is tricuspid. Aortic valve regurgitation is not visualized.  5. The inferior vena cava is normal in size with greater than 50% respiratory variability, suggesting right atrial pressure of 3 mmHg. FINDINGS  Left Ventricle: Left ventricular ejection fraction, by estimation, is 55 to 60%. Left ventricular ejection fraction by 2D MOD biplane is 54.4 %. The left ventricle has normal function. The left ventricle has no regional wall motion abnormalities. The left ventricular internal cavity size was normal in size. There is no left ventricular hypertrophy. Left ventricular diastolic parameters are consistent with Grade I diastolic dysfunction (impaired relaxation). Right Ventricle: The right ventricular size is normal. No increase in right ventricular wall thickness. Right ventricular systolic function is normal. There is normal pulmonary artery systolic pressure. The tricuspid regurgitant velocity is 1.85 m/s, and  with an assumed right atrial pressure of 3 mmHg, the estimated right ventricular systolic pressure is 16.7 mmHg. Left Atrium: Left atrial size was normal in size. Right Atrium: Right atrial size was normal in size. Pericardium: There is no evidence of pericardial effusion. Mitral Valve: The mitral valve is degenerative in appearance. Mild to moderate mitral annular calcification. Mild mitral valve regurgitation. MV peak gradient, 9.7 mmHg. The mean mitral valve gradient is 3.0 mmHg. Tricuspid Valve: The tricuspid valve is normal in structure. Tricuspid valve regurgitation is trivial. Aortic Valve: The aortic valve is tricuspid. Aortic valve regurgitation is not visualized. Aortic valve mean gradient measures 3.0 mmHg. Aortic valve peak gradient  measures 6.5 mmHg. Aortic valve area, by VTI measures 2.32 cm. Pulmonic Valve: The pulmonic valve was normal  in structure. Pulmonic valve regurgitation is not visualized. Aorta: The aortic root is normal in size and structure. Venous: The inferior vena cava is normal in size with greater than 50% respiratory variability, suggesting right atrial pressure of 3 mmHg. IAS/Shunts: No atrial level shunt detected by color flow Doppler.  LEFT VENTRICLE PLAX 2D                        Biplane EF (MOD) LVIDd:         5.20 cm         LV Biplane EF:   Left LVIDs:         3.40 cm                          ventricular LV PW:         0.60 cm                          ejection LV IVS:        0.60 cm                          fraction by LVOT diam:     2.00 cm                          2D MOD LV SV:         78                               biplane is LV SV Index:   44                               54.4 %. LVOT Area:     3.14 cm                                Diastology                                LV e' medial:    12.90 cm/s LV Volumes (MOD)               LV E/e' medial:  7.6 LV vol d, MOD    46.5 ml       LV e' lateral:   9.03 cm/s A2C:                           LV E/e' lateral: 10.9 LV vol d, MOD    58.1 ml A4C: LV vol s, MOD    21.4 ml A2C: LV vol s, MOD    28.0 ml A4C: LV SV MOD A2C:   25.1 ml LV SV MOD A4C:   58.1 ml LV SV MOD BP:    29.3 ml RIGHT VENTRICLE RV Basal diam:  3.15 cm RV Mid diam:    2.80 cm RV S prime:     18.50 cm/s TAPSE (M-mode): 3.0 cm LEFT ATRIUM             Index        RIGHT ATRIUM  Index LA diam:        3.80 cm 2.17 cm/m   RA Area:     17.80 cm LA Vol (A2C):   37.8 ml 21.57 ml/m  RA Volume:   47.70 ml  27.22 ml/m LA Vol (A4C):   49.3 ml 28.13 ml/m LA Biplane Vol: 46.3 ml 26.42 ml/m  AORTIC VALVE AV Area (Vmax):    2.29 cm AV Area (Vmean):   2.49 cm AV Area (VTI):     2.32 cm AV Vmax:           127.00 cm/s AV Vmean:          86.000 cm/s AV VTI:            0.334 m AV Peak Grad:      6.5 mmHg  AV Mean Grad:      3.0 mmHg LVOT Vmax:         92.60 cm/s LVOT Vmean:        68.200 cm/s LVOT VTI:          0.247 m LVOT/AV VTI ratio: 0.74  AORTA Ao Root diam: 3.20 cm MITRAL VALVE                TRICUSPID VALVE MV Area (PHT): 3.39 cm     TR Peak grad:   13.7 mmHg MV Area VTI:   2.29 cm     TR Vmax:        185.00 cm/s MV Peak grad:  9.7 mmHg MV Mean grad:  3.0 mmHg     SHUNTS MV Vmax:       1.56 m/s     Systemic VTI:  0.25 m MV Vmean:      75.8 cm/s    Systemic Diam: 2.00 cm MV Decel Time: 224 msec MV E velocity: 98.50 cm/s MV A velocity: 132.00 cm/s MV E/A ratio:  0.75 Debbe Odea MD Electronically signed by Debbe Odea MD Signature Date/Time: 06/02/2023/10:43:52 AM    Final    MR BRAIN WO CONTRAST Result Date: 06/01/2023 CLINICAL DATA:  Neuro deficit, acute, stroke suspected. EXAM: MRI HEAD WITHOUT CONTRAST TECHNIQUE: Multiplanar, multiecho pulse sequences of the brain and surrounding structures were obtained without intravenous contrast. COMPARISON:  Head CT June 01, 2023.  MRI brain June 09, 2016. FINDINGS: Brain: Small focus of restricted diffusion in the cortex of the left precentral gyrus. No hemorrhage, hydrocephalus, extra-axial collection or mass lesion. Remote small infarcts in the left precentral gyrus cortex, left centrum semiovale and right basal ganglia. A few scattered foci of T2 hyperintensity are seen within the white matter of the cerebral hemispheres, nonspecific. Enlarged, partially empty sella. Vascular: Normal flow voids. Skull and upper cervical spine: Degenerative changes of the cervical spine with at least mild spinal canal stenosis at C4-5. Postsurgical changes from prior C5-C6 ACDF. Sinuses/Orbits: Mucosal thickening of the bilateral ethmoid cells and maxillary sinuses with a mucous retention cyst in the left maxillary sinus. Chronic inflammatory changes of the right maxillary sinus which has decreased volume. The orbits are maintained. Other: None. IMPRESSION: 1.  Small acute infarct in the cortex of the left precentral gyrus. 2. Remote small infarcts in the left precentral gyrus cortex, left centrum semiovale and right basal ganglia. 3. Mild chronic microvascular ischemic changes of the white matter. Electronically Signed   By: Baldemar Lenis M.D.   On: 06/01/2023 14:10   CT ANGIO HEAD NECK W WO CM Result Date: 06/01/2023 CLINICAL DATA:  stroke/TIA, determine embolic source.  EXAM: CT ANGIOGRAPHY HEAD AND NECK WITH AND WITHOUT CONTRAST TECHNIQUE: Multidetector CT imaging of the head and neck was using the standard protocol during bolus administration of intravenous contrast. Multiplanar CT image reconstructions and MIPs were obtained to evaluate the vascular anatomy. Carotid stenosis measurements (when applicable) are obtained utilizing NASCET criteria, using the distal internal carotid diameter as the denominator. RADIATION DOSE REDUCTION: This exam was performed according to the departmental dose-optimization program which includes automated exposure control, adjustment of the mA and/or kV according to patient size and/or use of iterative reconstruction technique. CONTRAST:  75mL OMNIPAQUE IOHEXOL 350 MG/ML SOLN COMPARISON:  Head CT June 01, 2023. CT angiogram June 10, 2016. FINDINGS: CTA NECK FINDINGS Aortic arch: Standard branching. Imaged portion shows no evidence of aneurysm or dissection. Calcified atherosclerotic plaques are noted in the aortic arch. No significant stenosis of the major arch vessel origins. Right carotid system: Atherosclerotic changes of the right carotid bifurcation without hemodynamically significant stenosis, but progressed when compared to prior CTA. Left carotid system: Atherosclerotic changes of there left carotid bifurcation with mixed density plaque resulting in approximately 65% stenosis, progressed when compared to prior CTA. Vertebral arteries: Dominant right vertebral artery with preserved caliber throughout the  neck. Hypoplastic left vertebral artery also has preserved patency throughout the neck without focal stenosis. Skeleton: Degenerative changes of the cervical spine status post C5-C7 ACDF. Other neck: Negative. Upper chest: Implantable loop recorder in the left anterior chest wall. Review of the MIP images confirms the above findings CTA HEAD FINDINGS Anterior circulation: Calcified atherosclerotic plaques in the bilateral carotid siphons. No significant stenosis, proximal occlusion, aneurysm, or vascular malformation. Posterior circulation: Hypoplastic vertebrobasilar system with small caliber of the intracranial vertebral arteries and basilar arteries. Hypoplastic bilateral P1/PCA segments with prominent bilateral posterior communicating arteries. There is moderate stenosis at the P3 segment of the left posterior cerebral artery and distal P2 segment of the right posterior cerebral artery. Venous sinuses: As permitted by contrast timing, patent. Anatomic variants: Bilateral fetal PCA. Review of the MIP images confirms the above findings IMPRESSION: 1. No large vessel occlusion. 2. Atherosclerotic changes of the bilateral carotid bifurcations with approximately 65% stenosis on the left and no hemodynamically significant stenosis on the right. 3. 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 4. Intracranial atherosclerotic disease with moderate stenosis at the P3 segment of the left posterior cerebral artery and distal P2 segment of the right posterior cerebral artery. 5. Aortic atherosclerosis. Aortic Atherosclerosis (ICD10-I70.0). Electronically Signed   By: Baldemar Lenis M.D.   On: 06/01/2023 13:56   CT HEAD WO CONTRAST Result Date: 06/01/2023 CLINICAL DATA:  Provided history: Neuro deficit, acute, stroke suspected. Right-sided weakness. Dizziness. Gait issues. EXAM: CT HEAD WITHOUT CONTRAST TECHNIQUE: Contiguous axial images were obtained from the base of the skull through the  vertex without intravenous contrast. RADIATION DOSE REDUCTION: This exam was performed according to the departmental dose-optimization program which includes automated exposure control, adjustment of the mA and/or kV according to patient size and/or use of iterative reconstruction technique. COMPARISON:  Head CT 07/03/2019. FINDINGS: Brain: 9 mm focus of hypodensity within the left frontal lobe centrum semiovale (series 3, image 17). Partially empty sella turcica. There is no acute intracranial hemorrhage. No demarcated cortical infarct. No extra-axial fluid collection. No evidence of an intracranial mass. No midline shift. Vascular: No hyperdense vessel. Atherosclerotic calcifications. Skull: No calvarial fracture or aggressive osseous lesion. Sinuses/Orbits: No mass or acute finding within the imaged orbits. Minimal mucosal thickening within the frontal  sinuses, bilaterally. Mild-to-moderate mucosal thickening within the ethmoid sinuses, bilaterally. Mucosal thickening within the partially imaged right maxillary sinus (with associated chronic reactive osteitis). IMPRESSION: 1. 9 mm hypodense focus within the left frontal lobe white matter, nonspecific. This could reflect chronic small vessel ischemia. However, a recent white matter infarct cannot be excluded. A brain MRI may be obtained for further evaluation, as clinically warranted. 2. Paranasal sinus disease at the imaged levels, as described. Electronically Signed   By: Jackey Loge D.O.   On: 06/01/2023 09:10    Microbiology: No results found for this or any previous visit (from the past 240 hours).   Labs: Basic Metabolic Panel: Recent Labs  Lab 06/01/23 0826  NA 136  K 3.8  CL 102  CO2 24  GLUCOSE 109*  BUN 15  CREATININE 0.90  CALCIUM 9.5   Liver Function Tests: Recent Labs  Lab 06/01/23 0826  AST 50*  ALT 39  ALKPHOS 64  BILITOT 1.0  PROT 7.6  ALBUMIN 4.4   No results for input(s): "LIPASE", "AMYLASE" in the last 168  hours. No results for input(s): "AMMONIA" in the last 168 hours. CBC: Recent Labs  Lab 06/01/23 0826  WBC 3.9*  NEUTROABS 2.1  HGB 14.5  HCT 41.7  MCV 85.6  PLT 251   Cardiac Enzymes: No results for input(s): "CKTOTAL", "CKMB", "CKMBINDEX", "TROPONINI" in the last 168 hours. BNP: BNP (last 3 results) No results for input(s): "BNP" in the last 8760 hours.  ProBNP (last 3 results) No results for input(s): "PROBNP" in the last 8760 hours.  CBG: Recent Labs  Lab 06/01/23 0821  GLUCAP 99       Signed:  Silvano Bilis MD.  [facility] 06/02/2023, 3:40 PM

## 2023-06-02 NOTE — Care Management Obs Status (Signed)
MEDICARE OBSERVATION STATUS NOTIFICATION   Patient Details  Name: Kimberly Trevino MRN: [id number] Date of Birth: 1945/12/26   Medicare Observation Status Notification Given:   yes    Cristela Blue, CMA 06/02/2023, 10:15 AM

## 2023-06-02 NOTE — Care Management CC44 (Signed)
Condition Code 44 Documentation Completed  Patient Details  Name: Kimberly Trevino MRN: [id number] Date of Birth: 1945/08/30   Condition Code 44 given:  yes  Patient signature on Condition Code 44 notice:  yes  Documentation of 2 MD's agreement:   yes Code 44 added to claim:   yes    Miguelina Fore W, CMA 06/02/2023, 10:03 AM

## 2023-06-02 NOTE — TOC CM/SW Note (Signed)
Transition of Care [facility] - Inpatient Brief Assessment   Patient Details  Name: Juleanna Isom MRN: [id number] Date of Birth: [birth year]  Transition of Care [facility] CM/SW Contact:    Allena Katz, LCSW Phone Number: 06/02/2023, 2:34 PM   Clinical Narrative:    Transition of Care Asessment: Insurance and Status: Insurance coverage has been reviewed Patient has primary care physician: Yes Home environment has been reviewed: [address] [place] [address] Prior level of function:: independent Prior/Current Home Services: No current home services Social Drivers of Health Review: SDOH reviewed no interventions necessary Readmission risk has been reviewed: Yes Transition of care needs: no transition of care needs at this time

## 2023-06-06 ENCOUNTER — Telehealth (INDEPENDENT_AMBULATORY_CARE_PROVIDER_SITE_OTHER): Payer: Self-pay

## 2023-06-06 NOTE — Telephone Encounter (Signed)
Spoke with the patient and she is scheduled with Dr. Gilda Crease for a left carotid stent placement on 06/07/23 with a 8:30 am arrival time to the [facility]. Pre-procedure instructions were discussed and will be sent to Mychart. Patient stated she was writing this information down.

## 2023-06-07 ENCOUNTER — Encounter: Payer: Self-pay | Admitting: Vascular Surgery

## 2023-06-07 ENCOUNTER — Encounter
Admission: RE | Disposition: A | Discharge status: Home / Self Care | Payer: Self-pay | Source: Home / Self Care | Attending: Vascular Surgery

## 2023-06-07 ENCOUNTER — Other Ambulatory Visit: Payer: Self-pay

## 2023-06-07 ENCOUNTER — Inpatient Hospital Stay
Admission: RE | Admit: 2023-06-07 | Discharge: 2023-06-08 | DRG: 035 | Disposition: A | Discharge status: Home / Self Care | Payer: Medicare Other | Attending: Vascular Surgery | Admitting: Vascular Surgery

## 2023-06-07 DIAGNOSIS — E669 Obesity, unspecified: Secondary | ICD-10-CM | POA: Diagnosis present

## 2023-06-07 DIAGNOSIS — E785 Hyperlipidemia, unspecified: Secondary | ICD-10-CM | POA: Diagnosis present

## 2023-06-07 DIAGNOSIS — I2510 Atherosclerotic heart disease of native coronary artery without angina pectoris: Secondary | ICD-10-CM | POA: Diagnosis present

## 2023-06-07 DIAGNOSIS — I63239 Cerebral infarction due to unspecified occlusion or stenosis of unspecified carotid arteries: Principal | ICD-10-CM | POA: Diagnosis present

## 2023-06-07 DIAGNOSIS — I10 Essential (primary) hypertension: Secondary | ICD-10-CM | POA: Diagnosis present

## 2023-06-07 DIAGNOSIS — Z860100 Personal history of colon polyps, unspecified: Secondary | ICD-10-CM

## 2023-06-07 DIAGNOSIS — Z7951 Long term (current) use of inhaled steroids: Secondary | ICD-10-CM | POA: Diagnosis not present

## 2023-06-07 DIAGNOSIS — Z8241 Family history of sudden cardiac death: Secondary | ICD-10-CM

## 2023-06-07 DIAGNOSIS — Z7982 Long term (current) use of aspirin: Secondary | ICD-10-CM | POA: Diagnosis not present

## 2023-06-07 DIAGNOSIS — I69331 Monoplegia of upper limb following cerebral infarction affecting right dominant side: Secondary | ICD-10-CM | POA: Diagnosis not present

## 2023-06-07 DIAGNOSIS — Z8673 Personal history of transient ischemic attack (TIA), and cerebral infarction without residual deficits: Secondary | ICD-10-CM

## 2023-06-07 DIAGNOSIS — Z90711 Acquired absence of uterus with remaining cervical stump: Secondary | ICD-10-CM | POA: Diagnosis not present

## 2023-06-07 DIAGNOSIS — Z8249 Family history of ischemic heart disease and other diseases of the circulatory system: Secondary | ICD-10-CM | POA: Diagnosis not present

## 2023-06-07 DIAGNOSIS — Z79899 Other long term (current) drug therapy: Secondary | ICD-10-CM | POA: Diagnosis not present

## 2023-06-07 DIAGNOSIS — Z7902 Long term (current) use of antithrombotics/antiplatelets: Secondary | ICD-10-CM | POA: Diagnosis not present

## 2023-06-07 DIAGNOSIS — E039 Hypothyroidism, unspecified: Secondary | ICD-10-CM | POA: Diagnosis present

## 2023-06-07 DIAGNOSIS — I6522 Occlusion and stenosis of left carotid artery: Principal | ICD-10-CM | POA: Diagnosis present

## 2023-06-07 DIAGNOSIS — Z96611 Presence of right artificial shoulder joint: Secondary | ICD-10-CM | POA: Diagnosis present

## 2023-06-07 DIAGNOSIS — I771 Stricture of artery: Secondary | ICD-10-CM | POA: Diagnosis not present

## 2023-06-07 DIAGNOSIS — I4719 Other supraventricular tachycardia: Secondary | ICD-10-CM | POA: Diagnosis present

## 2023-06-07 DIAGNOSIS — Z83438 Family history of other disorder of lipoprotein metabolism and other lipidemia: Secondary | ICD-10-CM

## 2023-06-07 HISTORY — PX: CAROTID PTA/STENT INTERVENTION: CATH118231

## 2023-06-07 LAB — MRSA NEXT GEN BY PCR, NASAL: MRSA by PCR Next Gen: NOT DETECTED

## 2023-06-07 LAB — CREATININE, SERUM
Creatinine, Ser: 0.93 mg/dL (ref 0.44–1.00)
GFR, Estimated: >60 mL/min (ref >60)

## 2023-06-07 LAB — GLUCOSE, CAPILLARY: Glucose-Capillary: 79 mg/dL (ref 70–99)

## 2023-06-07 LAB — POCT ACTIVATED CLOTTING TIME: Activated Clotting Time: 256 s

## 2023-06-07 LAB — BUN: BUN: 17 mg/dL (ref 8–23)

## 2023-06-07 SURGERY — CAROTID PTA/STENT INTERVENTION
Anesthesia: Moderate Sedation | Laterality: Left

## 2023-06-07 MED ORDER — FAMOTIDINE 20 MG PO TABS: 40.0000 mg | ORAL_TABLET | Freq: Once | ORAL | Status: DC | PRN

## 2023-06-07 MED ORDER — DOCUSATE SODIUM 100 MG PO CAPS
100.0000 mg | ORAL_CAPSULE | Freq: Every day | ORAL | Status: DC
  Administered 2023-06-08: 100 mg via ORAL
  Filled 2023-06-07: qty 1

## 2023-06-07 MED ORDER — METOPROLOL TARTRATE 5 MG/5ML IV SOLN: 2.0000 mg | INTRAVENOUS | Status: DC | PRN

## 2023-06-07 MED ORDER — OXYCODONE HCL 5 MG PO TABS: 5.0000 mg | ORAL_TABLET | ORAL | Status: DC | PRN

## 2023-06-07 MED ORDER — CEFAZOLIN SODIUM-DEXTROSE 2-4 GM/100ML-% IV SOLN
2.0000 g | Freq: Three times a day (TID) | INTRAVENOUS | Status: AC
  Administered 2023-06-07 – 2023-06-08 (×2): 2 g via INTRAVENOUS
  Filled 2023-06-07 (×2): qty 100

## 2023-06-07 MED ORDER — MIDAZOLAM HCL 2 MG/2ML IJ SOLN
INTRAMUSCULAR | Status: AC
  Filled 2023-06-07: qty 2

## 2023-06-07 MED ORDER — PHENYLEPHRINE 80 MCG/ML (10ML) SYRINGE FOR IV PUSH (FOR BLOOD PRESSURE SUPPORT)
PREFILLED_SYRINGE | INTRAVENOUS | Status: AC
  Filled 2023-06-07: qty 10

## 2023-06-07 MED ORDER — CEFAZOLIN SODIUM-DEXTROSE 2-4 GM/100ML-% IV SOLN
2.0000 g | INTRAVENOUS | Status: AC
  Administered 2023-06-07: 2 g via INTRAVENOUS

## 2023-06-07 MED ORDER — MORPHINE SULFATE (PF) 4 MG/ML IV SOLN: 2.0000 mg | INTRAVENOUS | Status: DC | PRN

## 2023-06-07 MED ORDER — ACETAMINOPHEN 325 MG RE SUPP: 325.0000 mg | RECTAL | Status: DC | PRN

## 2023-06-07 MED ORDER — ATROPINE SULFATE 1 MG/10ML IJ SOSY
PREFILLED_SYRINGE | INTRAMUSCULAR | Status: AC
  Filled 2023-06-07: qty 10

## 2023-06-07 MED ORDER — HEPARIN SODIUM (PORCINE) 1000 UNIT/ML IJ SOLN
INTRAMUSCULAR | Status: DC | PRN
  Administered 2023-06-07: 8000 [IU] via INTRAVENOUS
  Administered 2023-06-07: 3000 [IU] via INTRAVENOUS
  Administered 2023-06-07: 2000 [IU] via INTRAVENOUS

## 2023-06-07 MED ORDER — ALUM & MAG HYDROXIDE-SIMETH 200-200-20 MG/5ML PO SUSP: 15.0000 mL | ORAL | Status: DC | PRN

## 2023-06-07 MED ORDER — LISINOPRIL 10 MG PO TABS
20.0000 mg | ORAL_TABLET | Freq: Every evening | ORAL | Status: DC
  Filled 2023-06-07 (×2): qty 2

## 2023-06-07 MED ORDER — PHENOL 1.4 % MT LIQD
1.0000 | OROMUCOSAL | Status: DC | PRN
Start: 2023-06-07 — End: 2023-06-08

## 2023-06-07 MED ORDER — SODIUM CHLORIDE 0.9 % IV SOLN: INTRAVENOUS | Status: DC

## 2023-06-07 MED ORDER — IODIXANOL 320 MG/ML IV SOLN
INTRAVENOUS | Status: DC | PRN
  Administered 2023-06-07: 105 mL

## 2023-06-07 MED ORDER — CEFAZOLIN SODIUM-DEXTROSE 2-4 GM/100ML-% IV SOLN
INTRAVENOUS | Status: AC
  Filled 2023-06-07: qty 100

## 2023-06-07 MED ORDER — DOCUSATE SODIUM 100 MG PO CAPS: 100.0000 mg | ORAL_CAPSULE | Freq: Every day | ORAL | Status: DC

## 2023-06-07 MED ORDER — LIDOCAINE-EPINEPHRINE (PF) 1 %-1:200000 IJ SOLN
INTRAMUSCULAR | Status: DC | PRN
  Administered 2023-06-07: 10 mL

## 2023-06-07 MED ORDER — LABETALOL HCL 5 MG/ML IV SOLN: 10.0000 mg | INTRAVENOUS | Status: DC | PRN

## 2023-06-07 MED ORDER — ASPIRIN 81 MG PO TBEC
162.0000 mg | DELAYED_RELEASE_TABLET | Freq: Every day | ORAL | Status: DC
Start: 2023-06-08 — End: 2023-06-08
  Administered 2023-06-08: 162 mg via ORAL
  Filled 2023-06-07: qty 2

## 2023-06-07 MED ORDER — ONDANSETRON HCL 4 MG/2ML IJ SOLN: 4.0000 mg | Freq: Four times a day (QID) | INTRAMUSCULAR | Status: DC | PRN

## 2023-06-07 MED ORDER — HYDROMORPHONE HCL 1 MG/ML IJ SOLN: 1.0000 mg | Freq: Once | INTRAMUSCULAR | Status: DC | PRN

## 2023-06-07 MED ORDER — FENTANYL CITRATE PF 50 MCG/ML IJ SOSY
PREFILLED_SYRINGE | INTRAMUSCULAR | Status: AC
  Filled 2023-06-07: qty 1

## 2023-06-07 MED ORDER — ISOSORBIDE MONONITRATE ER 60 MG PO TB24
60.0000 mg | ORAL_TABLET | Freq: Every evening | ORAL | Status: DC
  Filled 2023-06-07: qty 1

## 2023-06-07 MED ORDER — MIDAZOLAM HCL 2 MG/2ML IJ SOLN
INTRAMUSCULAR | Status: DC | PRN
  Administered 2023-06-07: 2 mg via INTRAVENOUS

## 2023-06-07 MED ORDER — HEPARIN SODIUM (PORCINE) 1000 UNIT/ML IJ SOLN
INTRAMUSCULAR | Status: AC
  Filled 2023-06-07: qty 10

## 2023-06-07 MED ORDER — SODIUM CHLORIDE 0.9 % IV SOLN: 500.0000 mL | Freq: Once | INTRAVENOUS | Status: DC | PRN

## 2023-06-07 MED ORDER — PHENYLEPHRINE HCL-NACL 20-0.9 MG/250ML-% IV SOLN
INTRAVENOUS | Status: AC
  Filled 2023-06-07: qty 250

## 2023-06-07 MED ORDER — CHLORHEXIDINE GLUCONATE CLOTH 2 % EX PADS
6.0000 | MEDICATED_PAD | Freq: Every day | CUTANEOUS | Status: DC
  Administered 2023-06-07: 6 via TOPICAL

## 2023-06-07 MED ORDER — LEVOTHYROXINE SODIUM 25 MCG PO TABS
25.0000 ug | ORAL_TABLET | Freq: Every day | ORAL | Status: DC
  Administered 2023-06-08: 25 ug via ORAL
  Filled 2023-06-07: qty 1

## 2023-06-07 MED ORDER — HYDRALAZINE HCL 20 MG/ML IJ SOLN: 5.0000 mg | INTRAMUSCULAR | Status: DC | PRN

## 2023-06-07 MED ORDER — PANTOPRAZOLE SODIUM 40 MG IV SOLR
40.0000 mg | Freq: Every day | INTRAVENOUS | Status: DC
  Administered 2023-06-07: 40 mg via INTRAVENOUS
  Filled 2023-06-07: qty 10

## 2023-06-07 MED ORDER — HEPARIN (PORCINE) IN NACL 1000-0.9 UT/500ML-% IV SOLN
INTRAVENOUS | Status: DC | PRN
  Administered 2023-06-07: 1500 mL

## 2023-06-07 MED ORDER — ATROPINE SULFATE 1 MG/10ML IJ SOSY
PREFILLED_SYRINGE | INTRAMUSCULAR | Status: DC | PRN
  Administered 2023-06-07: 1 mg via INTRAVENOUS

## 2023-06-07 MED ORDER — HEPARIN (PORCINE) IN NACL 2000-0.9 UNIT/L-% IV SOLN
INTRAVENOUS | Status: DC | PRN
  Administered 2023-06-07: 3000 mL

## 2023-06-07 MED ORDER — CLOPIDOGREL BISULFATE 75 MG PO TABS
150.0000 mg | ORAL_TABLET | ORAL | Status: AC
  Administered 2023-06-07: 150 mg via ORAL
  Filled 2023-06-07: qty 2

## 2023-06-07 MED ORDER — FAMOTIDINE 20 MG PO TABS
40.0000 mg | ORAL_TABLET | ORAL | Status: DC
  Administered 2023-06-08: 40 mg via ORAL
  Filled 2023-06-07: qty 2

## 2023-06-07 MED ORDER — CLOPIDOGREL BISULFATE 75 MG PO TABS
75.0000 mg | ORAL_TABLET | Freq: Every day | ORAL | Status: DC
  Administered 2023-06-08: 75 mg via ORAL
  Filled 2023-06-07: qty 1

## 2023-06-07 MED ORDER — DOPAMINE-DEXTROSE 3.2-5 MG/ML-% IV SOLN
INTRAVENOUS | Status: AC
  Filled 2023-06-07: qty 250

## 2023-06-07 MED ORDER — HEPARIN SODIUM (PORCINE) 1000 UNIT/ML IJ SOLN
INTRAMUSCULAR | Status: AC
  Filled 2023-06-07: qty 20

## 2023-06-07 MED ORDER — LIDOCAINE HCL (PF) 1 % IJ SOLN
INTRAMUSCULAR | Status: DC | PRN
  Administered 2023-06-07: 10 mL

## 2023-06-07 MED ORDER — ROSUVASTATIN CALCIUM 20 MG PO TABS
40.0000 mg | ORAL_TABLET | Freq: Every evening | ORAL | Status: DC
Start: 2023-06-07 — End: 2023-06-08
  Administered 2023-06-07: 40 mg via ORAL
  Filled 2023-06-07: qty 2
  Filled 2023-06-07: qty 4
  Filled 2023-06-07: qty 2

## 2023-06-07 MED ORDER — DILTIAZEM HCL ER 240 MG PO TB24
240.0000 mg | ORAL_TABLET | Freq: Every day | ORAL | Status: DC
  Administered 2023-06-07 – 2023-06-08 (×2): 240 mg via ORAL
  Filled 2023-06-07 (×2): qty 1

## 2023-06-07 MED ORDER — ACETAMINOPHEN 325 MG PO TABS
325.0000 mg | ORAL_TABLET | ORAL | Status: DC | PRN
Start: 2023-06-07 — End: 2023-06-08

## 2023-06-07 MED ORDER — SERTRALINE HCL 25 MG PO TABS
25.0000 mg | ORAL_TABLET | Freq: Every evening | ORAL | Status: DC
Start: 2023-06-07 — End: 2023-06-08
  Administered 2023-06-07: 25 mg via ORAL
  Filled 2023-06-07 (×2): qty 1

## 2023-06-07 MED ORDER — FENTANYL CITRATE (PF) 100 MCG/2ML IJ SOLN
INTRAMUSCULAR | Status: DC | PRN
  Administered 2023-06-07: 50 ug via INTRAVENOUS

## 2023-06-07 MED ORDER — METHYLPREDNISOLONE SODIUM SUCC 125 MG IJ SOLR: 125.0000 mg | Freq: Once | INTRAMUSCULAR | Status: DC | PRN

## 2023-06-07 MED ORDER — MIDAZOLAM HCL 2 MG/ML PO SYRP: 8.0000 mg | ORAL_SOLUTION | Freq: Once | ORAL | Status: DC | PRN

## 2023-06-07 MED ORDER — DIPHENHYDRAMINE HCL 50 MG/ML IJ SOLN: 50.0000 mg | Freq: Once | INTRAMUSCULAR | Status: DC | PRN

## 2023-06-07 MED ORDER — GUAIFENESIN-DM 100-10 MG/5ML PO SYRP
15.0000 mL | ORAL_SOLUTION | ORAL | Status: DC | PRN
Start: 2023-06-07 — End: 2023-06-08

## 2023-06-07 SURGICAL SUPPLY — 29 items
BALLN VTRAC 4.5X30X135 (BALLOONS) ×1
BALLOON VTRAC 4.5X30X135 (BALLOONS) IMPLANT
CATH ANGIO 5F PIGTAIL 100CM (CATHETERS) IMPLANT
CATH ANGIO 5F SIM1 100CM (CATHETERS) IMPLANT
CATH BEACON 5 .035 100 H1 TIP (CATHETERS) IMPLANT
CATH BEACON 5 .035 100 JB1 TIP (CATHETERS) IMPLANT
CATH INFINITI JR4 5F (CATHETERS) IMPLANT
CATH SLIP .038X100 JB2 (CATHETERS) IMPLANT
COVER PROBE ULTRASOUND 5X96 (MISCELLANEOUS) IMPLANT
DEVICE EMBOSHIELD NAV6 4.0-7.0 (FILTER) IMPLANT
DEVICE PRESTO INFLATION (MISCELLANEOUS) IMPLANT
DEVICE STARCLOSE SE CLOSURE (Vascular Products) IMPLANT
DEVICE TORQUE (MISCELLANEOUS) IMPLANT
GLIDEWIRE ANGLED SS 035X260CM (WIRE) IMPLANT
KIT CAROTID MANIFOLD (MISCELLANEOUS) IMPLANT
NDL ENTRY 21GA 7CM ECHOTIP (NEEDLE) IMPLANT
NEEDLE ENTRY 21GA 7CM ECHOTIP (NEEDLE) ×1 IMPLANT
PACK ANGIOGRAPHY (CUSTOM PROCEDURE TRAY) ×1 IMPLANT
SET INTRO CAPELLA COAXIAL (SET/KITS/TRAYS/PACK) IMPLANT
SHEATH BRITE TIP 6FRX11 (SHEATH) IMPLANT
SHEATH SHUTTLE 6FRX80 (SHEATH) IMPLANT
STENT XACT CAR 9-7X40X136 (Permanent Stent) IMPLANT
SUT MNCRL AB 4-0 PS2 18 (SUTURE) IMPLANT
SYR MEDRAD MARK 7 150ML (SYRINGE) IMPLANT
TUBING CONTRAST HIGH PRESS 72 (TUBING) IMPLANT
WIRE G VAS 035X260 STIFF (WIRE) IMPLANT
WIRE GUIDERIGHT .035X150 (WIRE) IMPLANT
WIRE HI TORQ COMMND ES.014X300 (WIRE) IMPLANT
WIRE SUPPORT BAREWIRE .014X190 (WIRE) IMPLANT

## 2023-06-07 NOTE — Interval H&P Note (Signed)
History and Physical Interval Note:  06/07/2023 12:00 PM  Kimberly Trevino  has presented today for surgery, with the diagnosis of L Carotid Stent    ABBOTT     L carotid stenosis.  The various methods of treatment have been discussed with the patient and family. After consideration of risks, benefits and other options for treatment, the patient has consented to  Procedure(s): CAROTID PTA/STENT INTERVENTION (Left) as a surgical intervention.  The patient's history has been reviewed, patient examined, no change in status, stable for surgery.  I have reviewed the patient's chart and labs.  Questions were answered to the patient's satisfaction.     Levora Dredge

## 2023-06-07 NOTE — Plan of Care (Signed)
  Problem: Education: Goal: Knowledge of discharge needs will improve Outcome: Progressing   Problem: Clinical Measurements: Goal: Postoperative complications will be avoided or minimized Outcome: Progressing   Problem: Activity: Goal: Ability to return to baseline activity level will improve Outcome: Progressing   Problem: Cardiovascular: Goal: Ability to achieve and maintain adequate cardiovascular perfusion will improve Outcome: Progressing Goal: Vascular access site(s) Level 0-1 will be maintained Outcome: Progressing

## 2023-06-08 ENCOUNTER — Encounter: Payer: Self-pay | Admitting: Vascular Surgery

## 2023-06-08 LAB — BASIC METABOLIC PANEL
Anion gap: 10 (ref 5–15)
BUN: 14 mg/dL (ref 8–23)
CO2: 20 mmol/L — ABNORMAL LOW (ref 22–32)
Calcium: 8.2 mg/dL — ABNORMAL LOW (ref 8.9–10.3)
Chloride: 108 mmol/L (ref 98–111)
Creatinine, Ser: 0.73 mg/dL (ref 0.44–1.00)
GFR, Estimated: >60 mL/min (ref >60)
Glucose, Bld: 93 mg/dL (ref 70–99)
Potassium: 3.6 mmol/L (ref 3.5–5.1)
Sodium: 138 mmol/L (ref 135–145)

## 2023-06-08 LAB — CBC
HCT: 32 % — ABNORMAL LOW (ref 36.0–46.0)
Hemoglobin: 11.1 g/dL — ABNORMAL LOW (ref 12.0–15.0)
MCH: 29.4 pg (ref 26.0–34.0)
MCHC: 34.7 g/dL (ref 30.0–36.0)
MCV: 84.7 fL (ref 80.0–100.0)
Platelets: 184 10*3/uL (ref 150–400)
RBC: 3.78 MIL/uL — ABNORMAL LOW (ref 3.87–5.11)
RDW: 13.2 % (ref 11.5–15.5)
WBC: 4.8 10*3/uL (ref 4.0–10.5)
nRBC: 0 % (ref 0.0–0.2)

## 2023-06-08 MED ORDER — ASPIRIN 81 MG PO TBEC: 162.0000 mg | DELAYED_RELEASE_TABLET | Freq: Every day | ORAL | 12 refills | Status: AC

## 2023-06-08 MED ORDER — CLOPIDOGREL BISULFATE 75 MG PO TABS: 75.0000 mg | ORAL_TABLET | Freq: Every day | ORAL | 12 refills | Status: AC

## 2023-06-08 MED ORDER — PANTOPRAZOLE SODIUM 40 MG PO TBEC: 40.0000 mg | DELAYED_RELEASE_TABLET | Freq: Every day | ORAL | Status: DC

## 2023-06-08 NOTE — Discharge Instructions (Addendum)
Do not lift anything heavy.  Do not lift anything more than a gallon of milk for the next 2 weeks.  You may shower tomorrow on Friday, 06/09/2023.  When showering shower with the old dressing to your groin in place.  Remove dressing after showering.  Completely dry the area to your groin and place a Band-Aid over the insertion site for the next 3 days.  You may not drive for the next 2 weeks.  You will be discharged home on aspirin 162 mg daily, Plavix 75 mg daily and Crestor 40 mg daily.  Do not miss or skip any of these medications, as this may affect the outcome of your surgery.  Follow-up with vein and vascular surgery as scheduled.

## 2023-06-08 NOTE — TOC CM/SW Note (Signed)
Transition of Care [facility]) - Inpatient Brief Assessment   Patient Details  Name: Kimberly Trevino MRN: [id number] Date of Birth: Jun 25, 1945  Transition of Care [facility]) CM/SW Contact:    Margarito Liner, LCSW Phone Number: 06/08/2023, 11:39 AM   Clinical Narrative: Patient has orders to discharge home today. Chart reviewed. No TOC needs identified. CSW signing off.  Transition of Care Asessment: Insurance and Status: Insurance coverage has been reviewed Patient has primary care physician: Yes Home environment has been reviewed: Apartment Prior level of function:: Not documented Prior/Current Home Services: No current home services Social Drivers of Health Review: SDOH reviewed no interventions necessary Readmission risk has been reviewed: Yes Transition of care needs: no transition of care needs at this time

## 2023-06-08 NOTE — Discharge Summary (Signed)
[facility]    Discharge Summary    Patient ID:  Kimberly Trevino MRN: [id number] DOB/AGE: 12-20-1945 78 y.o.  Admit date: 06/07/2023 Discharge date: 06/08/2023 Date of Surgery: 06/07/2023 Surgeon: Surgeon(s): Schnier, Latina Craver, MD  Admission Diagnosis: Symptomatic carotid artery stenosis with infarction [facility]) [I63.239]  Discharge Diagnoses:  Symptomatic carotid artery stenosis with infarction [facility]) [I63.239]  Secondary Diagnoses: Past Medical History:  Diagnosis Date   Acute ischemic left MCA stroke (HCC) 04/11/2016   a.) small LEFT frontal MCA territory infarct   Anxiety    Arthritis    Asthma    B12 deficiency    Carotid stenosis, bilateral    a.) Carotid doppler 04/10/2016: mild (1-49%) BILATERAL ICAs.   Chronic low back pain    Colon polyp    Coronary artery disease 2011   a.) LHC 2011: 50% mLAD; no intervention opting for med mgmt. b.) LHC 06/04/2020: 70% p-mLAD, 90% oD1, 70% RI, 70-80% Cx marginal branch, 60% RCA; anatomy not ideally suited for PCI --> CVTS consult for CABG consideration recommended for refractory symptoms.   Depression    Diastolic dysfunction    a.)  TTE 04/11/2016: EF 60%; no RWMAs; mild MR; mild LA dilation; G1DD.   Female cystocele    GERD (gastroesophageal reflux disease)    Hyperlipidemia    Hypertension    Hypothyroidism    Incontinence in female    Long term current use of antithrombotics/antiplatelets    a.) DAPT therapy (ASA + clopidogrel)   OAB (overactive bladder)    Obesity    Paresthesias in left hand    PSVT (paroxysmal supraventricular tachycardia) (HCC)    Tachycardia 2010   TIA (transient ischemic attack)    Vitamin D deficiency     Procedure(s): CAROTID PTA/STENT INTERVENTION  Discharged Condition: good  HPI:  Kimberly Trevino is a 78 yo female who is now POD #1 from left Carotid Endovascular Stent placement.  Patient is recovering as expected.  Patient does endorse some soreness in her neck  which is normal.  Patient's right groin was cannulated for the procedure and is now with dressing clean dry and intact.  No hematoma seroma or infection to note.  Patient eating well, urinating, ambulating the unit well.  Patient's vitals all remained stable.  Patient to be discharged home later today.  Patient to be discharged on 162 mg of aspirin daily, Plavix 75 mg daily, and Crestor 40 mg daily.  Patient was instructed to not miss taking any of these medications as it will affect the outcome of her procedure.  Patient verbalizes her understanding  Hospital Course:  Kimberly Trevino is a 78 y.o. female is S/P Left endovascular Carotid Stent Placement  Extubated: POD # 0 Physical Exam:  Alert notes x3, no acute distress Face: Symmetrical.  Tongue is midline. Neck: Trachea is midline.  No swelling or bruising. Cardiovascular: Regular rate and rhythm Pulmonary: Clear to auscultation bilaterally Abdomen: Soft, nontender, nondistended Right groin access: Clean dry and intact.  No swelling or drainage noted Left lower extremity: Thigh soft.  Calf soft.  Extremities warm distally toes.  Hard to palpate pedal pulses however the foot is warm is her good capillary refill. Right lower extremity: Thigh soft.  Calf soft.  Extremities warm distally toes.  Hard to palpate pedal pulses however the foot is warm is her good capillary refill. Neurological: No deficits noted   Post-op wounds:  clean, dry, intact or healing well  Pt. Ambulating, voiding and  taking PO diet without difficulty. Pt pain controlled with PO pain meds.  Labs:  As below  Complications: none  Consults:    Significant Diagnostic Studies: CBC Lab Results  Component Value Date   WBC 4.8 06/08/2023   HGB 11.1 (L) 06/08/2023   HCT 32.0 (L) 06/08/2023   MCV 84.7 06/08/2023   PLT 184 06/08/2023    BMET    Component Value Date/Time   NA 138 06/08/2023 0350   NA 140 05/02/2012 1255   K 3.6 06/08/2023 0350   K 3.3  (L) 05/02/2012 1255   CL 108 06/08/2023 0350   CL 107 05/02/2012 1255   CO2 20 (L) 06/08/2023 0350   CO2 26 05/02/2012 1255   GLUCOSE 93 06/08/2023 0350   GLUCOSE 122 (H) 05/02/2012 1255   BUN 14 06/08/2023 0350   BUN 13 05/02/2012 1255   CREATININE 0.73 06/08/2023 0350   CREATININE 0.67 05/02/2012 1255   CALCIUM 8.2 (L) 06/08/2023 0350   CALCIUM 9.1 05/02/2012 1255   GFRNONAA >60 06/08/2023 0350   GFRNONAA >60 05/02/2012 1255   GFRAA 55 (L) 11/18/2019 1310   GFRAA >60 05/02/2012 1255   COAG Lab Results  Component Value Date   INR 1.0 06/01/2023   INR 0.91 06/09/2016   INR 0.92 04/10/2016     Disposition:  Discharge to :Home  Allergies as of 06/08/2023       Reactions   Doxycycline    Angioedema + itching   Sulfa Antibiotics Hives   Welps   Gemtesa [vibegron] Diarrhea        Medication List     TAKE these medications    acetaminophen 325 MG tablet Commonly known as: TYLENOL Take 2 tablets (650 mg total) by mouth every 6 (six) hours as needed for mild pain (or Fever >/= 101).   ascorbic acid 500 MG tablet Commonly known as: VITAMIN C Take 500 mg by mouth daily.   aspirin EC 81 MG tablet Take 2 tablets (162 mg total) by mouth daily. Swallow whole. What changed: additional instructions   azelastine 0.1 % nasal spray Commonly known as: ASTELIN Place 1 spray into both nostrils 2 (two) times daily as needed for rhinitis or allergies. Use in each nostril as directed   clopidogrel 75 MG tablet Commonly known as: PLAVIX Take 1 tablet (75 mg total) by mouth daily.   Dilt-XR 240 MG 24 hr capsule Generic drug: diltiazem Take 1 capsule by mouth daily.   docusate sodium 100 MG capsule Commonly known as: COLACE Take 100 mg by mouth daily.   EQ Eye Allergy Relief 0.027-0.315 % Soln Generic drug: Naphazoline-Pheniramine Place 1 drop into both eyes daily as needed (eye allergies).   Euthyrox 25 MCG tablet Generic drug: levothyroxine Take 25 mcg by mouth  daily before breakfast.   famotidine 40 MG tablet Commonly known as: PEPCID Take 40 mg by mouth every morning.   fluticasone-salmeterol 250-50 MCG/ACT Aepb Commonly known as: ADVAIR Inhale 1 puff into the lungs 2 (two) times daily as needed (asthma).   gabapentin 300 MG capsule Commonly known as: NEURONTIN Take 300 mg by mouth at bedtime.   isosorbide mononitrate 60 MG 24 hr tablet Commonly known as: IMDUR Take 60 mg by mouth every evening.   lisinopril 10 MG tablet Commonly known as: ZESTRIL Take 20 mg by mouth every evening.   loratadine 10 MG tablet Commonly known as: CLARITIN Take 10 mg by mouth every morning.   metoprolol succinate 100 MG 24 hr tablet  Commonly known as: TOPROL-XL Take 100 mg by mouth every evening. Take with or immediately following a meal.   multivitamin tablet Take 1 tablet by mouth daily.   rosuvastatin 40 MG tablet Commonly known as: CRESTOR Take 40 mg by mouth every evening.   sertraline 50 MG tablet Commonly known as: ZOLOFT Take 25 mg by mouth every evening.       Verbal and written Discharge instructions given to the patient. Wound care per Discharge AVS  Follow-up Information     Schnier, Latina Craver, MD Follow up in 1 month(s).   Specialties: Vascular Surgery, Cardiology, Radiology, Vascular Surgery Why: Carotid Ultrasound post stent placement Contact information: [address] [place] [address] [phone]                 Signed: Marcie Bal, NP  06/08/2023, 9:27 AM

## 2023-06-08 NOTE — Progress Notes (Addendum)
0800 Awake and alert. 1230 Discharged home with daughter.

## 2023-06-14 DIAGNOSIS — R7309 Other abnormal glucose: Secondary | ICD-10-CM | POA: Insufficient documentation

## 2023-06-14 NOTE — Assessment & Plan Note (Signed)
Blood sugar 110s Check A1C Monitor

## 2023-06-20 NOTE — Op Note (Signed)
OPERATIVE NOTE DATE: 06/07/2023  PROCEDURE:  Ultrasound guidance for vascular access right femoral artery  Placement of a 9 mm x 7 mm x 40 mm exact stent with the use of the NAV-6 embolic protection device in the left internal carotid artery.    Placement of an additional 9 mm x 7 mm x 40 mm exact stent left internal carotid artery. StarClose right common femoral artery  PRE-OPERATIVE DIAGNOSIS: 1.  Greater than 70% left internal carotid artery stenosis. 2.  Multiple TIAs with an associated CVA  POST-OPERATIVE DIAGNOSIS:  Same as above  SURGEON: Levora Dredge  ASSISTANT(S): None  ANESTHESIA: local/MCS  ESTIMATED BLOOD LOSS: 100 cc  CONTRAST: 105 cc  FLUORO TIME: 24.9 minutes  MODERATE CONSCIOUS SEDATION TIME: Continuous ECG pulse oximetry and cardiopulmonary monitoring was performed throughout the entire procedure by the interventional radiology nurse total sedation time was 108 minutes  FINDING(S): 1.   Greater than 70% left internal carotid artery stenosis  SPECIMEN(S):   none  INDICATIONS:   Patient is a 78 y.o. female who presents with greater than 70% left internal carotid artery stenosis.  The patient has severe neck immobility as well as a high bifurcation and carotid artery stenting was felt to be preferred to endarterectomy for that reason.  Risks and benefits were discussed and informed consent was obtained.   DESCRIPTION: After obtaining full informed written consent, the patient was brought back to the vascular suite and placed supine upon the table.  The patient received IV antibiotics prior to induction. Moderate conscious sedation was administered during a face to face encounter with the patient throughout the procedure with my supervision of the RN administering medicines and monitoring the patients vital signs and mental status throughout from the start of the procedure until the patient was taken to the recovery room.    After obtaining adequate  sedation, the patient was prepped and draped in the standard fashion.    The right femoral artery was visualized with ultrasound and found to be widely patent. It was then accessed under direct ultrasound guidance without difficulty with a micropuncture needle. A permanent image was recorded.  A microwire was then advanced without difficulty under fluoroscopic guidance followed by a micro-sheath.  A J-wire was placed and we then placed a 6 French sheath. The patient was then heparinized and a total of 10,000 units of intravenous heparin were given and an ACT was checked to confirm successful anticoagulation.   A pigtail catheter was then placed into the ascending aorta and an LAO view of the aortic arch was obtained. This showed a bovine arch anatomy, no evidence of significant ostial stenosis of the great vessels. The left common carotid artery was then selectively cannulated after using several different catheters and succeeding with a JB2 catheter and the catheter advanced into the mid left common carotid artery.  Cervical and cerebral carotid angiography was then performed. There were no obvious intracranial filling defects. The left carotid bifurcation demonstrated a greater than 70% stenosis associated with an ulceration and mild tortuosity in the distal common carotid as well as in the internal carotid artery just distal to the lesion.  I then advanced into the external carotid artery with a Glidewire and the JB2 catheter and then exchanged for the Amplatz Super Stiff wire. Over the Amplatz Super Stiff wire, a 6 [place] shuttle sheath was placed into the mid common carotid artery. I then used the NAV-6  Embolic protection device and crossed the lesion and parked  this in the distal internal carotid artery at the base of the skull.  I then selected a 9 mm x 7 mm x 40 mm Exact stent. This was deployed across the lesion encompassing it in its entirety. A 4.5 mm x 30 mm length balloon was used to post dilate  the stent.  Inflation was to 10 atm.  Follow-up imaging demonstrated only about a 10% residual stenosis was present after angioplasty.  However, where the tortuosity in the internal carotid artery was previously noted the stent had now created a confirmational change that resulted in a greater than 90% flow-limiting kink.  After imaging this new lesion in multiple different angles it became quite clear that it would need to be treated.  I was able to advance a second 9 mm x 7 mm x 40 mm exact stent across this lesion overlapping the original stent by approximately 10 mm.  Deployment was successful across this lesion and follow-up imaging now demonstrated complete resolution of the lesion with no further kinking or sharp angles.  Post stent balloon angioplasty was not necessary given the near normal appearance of the distal internal carotid artery.  Completion angiogram showed normal intracranial filling without new defects. At this point I elected to terminate the procedure. The sheath was removed and StarClose closure device was deployed in the right femoral artery with excellent hemostatic result. The patient was taken to the recovery room in stable condition having tolerated the procedure well.  COMPLICATIONS: none  CONDITION: stable  Levora Dredge 06/20/2023 3:22 PM   This note was created with Dragon Medical transcription system. Any errors in dictation are purely unintentional.

## 2023-07-13 ENCOUNTER — Other Ambulatory Visit (INDEPENDENT_AMBULATORY_CARE_PROVIDER_SITE_OTHER): Payer: Self-pay | Admitting: Vascular Surgery

## 2023-07-13 DIAGNOSIS — I6523 Occlusion and stenosis of bilateral carotid arteries: Secondary | ICD-10-CM

## 2023-07-16 NOTE — Progress Notes (Unsigned)
 Patient ID: Kimberly Trevino, female   DOB: 12-Oct-1945, 78 y.o.   MRN: [id number]  No chief complaint on file.   HPI Kimberly Trevino is a 78 y.o. female.    The patient is seen for follow up evaluation of carotid stenosis status post left carotid stent placement on 06/07/2023.  There were no post operative problems or complications related to the surgery.  The patient denies neck or incisional pain.  Procedure:  Placement of a 9 mm x 7 mm x 40 mm exact stent with the use of the NAV-6 embolic protection device in the left internal carotid artery.    Placement of an additional 9 mm x 7 mm x 40 mm exact stent left internal carotid artery.  The patient denies interval amaurosis fugax. There is no recent history of TIA symptoms or focal motor deficits. There is no prior documented CVA.  The patient denies headache.  The patient is taking enteric-coated aspirin 81 mg daily.  No recent shortening of the patient's walking distance or new symptoms consistent with claudication.  No history of rest pain symptoms. No new ulcers or wounds of the lower extremities have occurred.  There is no history of DVT, PE or superficial thrombophlebitis. No recent episodes of angina or shortness of breath documented.    Past Medical History:  Diagnosis Date   Acute ischemic left MCA stroke (HCC) 04/11/2016   a.) small LEFT frontal MCA territory infarct   Anxiety    Arthritis    Asthma    B12 deficiency    Carotid stenosis, bilateral    a.) Carotid doppler 04/10/2016: mild (1-49%) BILATERAL ICAs.   Chronic low back pain    Colon polyp    Coronary artery disease 2011   a.) LHC 2011: 50% mLAD; no intervention opting for med mgmt. b.) LHC 06/04/2020: 70% p-mLAD, 90% oD1, 70% RI, 70-80% Cx marginal branch, 60% RCA; anatomy not ideally suited for PCI --> CVTS consult for CABG consideration recommended for refractory symptoms.   Depression    Diastolic dysfunction    a.)  TTE 04/11/2016: EF 60%; no  RWMAs; mild MR; mild LA dilation; G1DD.   Female cystocele    GERD (gastroesophageal reflux disease)    Hyperlipidemia    Hypertension    Hypothyroidism    Incontinence in female    Long term current use of antithrombotics/antiplatelets    a.) DAPT therapy (ASA + clopidogrel)   OAB (overactive bladder)    Obesity    Paresthesias in left hand    PSVT (paroxysmal supraventricular tachycardia) (HCC)    Tachycardia 2010   TIA (transient ischemic attack)    Vitamin D deficiency     Past Surgical History:  Procedure Laterality Date   ABDOMINAL HYSTERECTOMY     CARDIAC CATHETERIZATION  05/16/2009   50% to the mid LAD lesion   CAROTID PTA/STENT INTERVENTION Left 06/07/2023   Procedure: CAROTID PTA/STENT INTERVENTION;  Surgeon: Renford Dills, MD;  Location: [facility] INVASIVE CV LAB;  Service: Cardiovascular;  Laterality: Left;   CARPAL TUNNEL RELEASE Bilateral    CERVICAL FUSION     COLONOSCOPY WITH PROPOFOL N/A 05/21/2021   Procedure: COLONOSCOPY WITH PROPOFOL;  Surgeon: Regis Bill, MD;  Location: [facility] ENDOSCOPY;  Service: Endoscopy;  Laterality: N/A;  PLAVIX   KNEE ARTHROSCOPY Left    PARTIAL HYSTERECTOMY  05/16/1997   REVERSE SHOULDER ARTHROPLASTY Right 08/17/2021   Procedure: REVERSE SHOULDER ARTHROPLASTY WITH BICEPS TENODESIS;  Surgeon: Christena Flake, MD;  Location: [facility] ORS;  Service: Orthopedics;  Laterality: Right;   TEE WITHOUT CARDIOVERSION N/A 06/13/2016   Procedure: TRANSESOPHAGEAL ECHOCARDIOGRAM (TEE);  Surgeon: Iran Ouch, MD;  Location: [facility] ORS;  Service: Cardiovascular;  Laterality: N/A;   TONSILLECTOMY     TUBAL LIGATION        Allergies  Allergen Reactions   Doxycycline     Angioedema + itching    Sulfa Antibiotics Hives    Welps   Gemtesa [Vibegron] Diarrhea    Current Outpatient Medications  Medication Sig Dispense Refill   acetaminophen (TYLENOL) 325 MG tablet Take 2 tablets (650 mg total) by mouth every 6 (six) hours as needed for mild  pain (or Fever >/= 101).     aspirin EC 81 MG tablet Take 2 tablets (162 mg total) by mouth daily. Swallow whole. 30 tablet 12   azelastine (ASTELIN) 0.1 % nasal spray Place 1 spray into both nostrils 2 (two) times daily as needed for rhinitis or allergies. Use in each nostril as directed     clopidogrel (PLAVIX) 75 MG tablet Take 1 tablet (75 mg total) by mouth daily. 30 tablet 12   DILT-XR 240 MG 24 hr capsule Take 1 capsule by mouth daily.     docusate sodium (COLACE) 100 MG capsule Take 100 mg by mouth daily.     EUTHYROX 25 MCG tablet Take 25 mcg by mouth daily before breakfast.     famotidine (PEPCID) 40 MG tablet Take 40 mg by mouth every morning.     fluticasone-salmeterol (ADVAIR) 250-50 MCG/ACT AEPB Inhale 1 puff into the lungs 2 (two) times daily as needed (asthma).     gabapentin (NEURONTIN) 300 MG capsule Take 300 mg by mouth at bedtime.     isosorbide mononitrate (IMDUR) 60 MG 24 hr tablet Take 60 mg by mouth every evening.     lisinopril (ZESTRIL) 10 MG tablet Take 20 mg by mouth every evening.     loratadine (CLARITIN) 10 MG tablet Take 10 mg by mouth every morning.     metoprolol succinate (TOPROL-XL) 100 MG 24 hr tablet Take 100 mg by mouth every evening. Take with or immediately following a meal.     Multiple Vitamin (MULTIVITAMIN) tablet Take 1 tablet by mouth daily.     Naphazoline-Pheniramine (EQ EYE ALLERGY RELIEF) 0.027-0.315 % SOLN Place 1 drop into both eyes daily as needed (eye allergies).     rosuvastatin (CRESTOR) 40 MG tablet Take 40 mg by mouth every evening.     sertraline (ZOLOFT) 50 MG tablet Take 25 mg by mouth every evening.     vitamin C (ASCORBIC ACID) 500 MG tablet Take 500 mg by mouth daily.     No current facility-administered medications for this visit.        Physical Exam There were no vitals taken for this visit. Gen:  WD/WN, NAD Skin: incision C/D/I; no carotid bruit; neuro is intact     Assessment/Plan: 1. Acute CVA (cerebrovascular  accident) (HCC) (Primary) Recommend:  The patient is s/p successful left carotid stent  Duplex ultrasound  shows 1-39%  stenosis bilateral ICA's.  Continue dual antiplatelet therapy as prescribed for a minimum of 6 months.  Continue management of CAD, HTN and Hyperlipidemia Healthy heart diet,  encouraged exercise at least 4 times per week  The patient's NIHSS score is as follows: 1 Mild: 1 - 5 Mild to Moderately Severe: 5 - 14 Severe: 15 - 24 Very Severe: >25  Follow up in 6 months  with duplex ultrasound and physical exam.  - VAS US CAROTID; Future  2. Stenosis of left carotid artery See #1 - VAS US CAROTID; Future      Levora Dredge 07/16/2023, 1:18 PM   This note was created with Dragon medical transcription system.  Any errors from dictation are unintentional.

## 2023-07-17 ENCOUNTER — Ambulatory Visit (INDEPENDENT_AMBULATORY_CARE_PROVIDER_SITE_OTHER): Payer: Self-pay | Admitting: Vascular Surgery

## 2023-07-17 ENCOUNTER — Ambulatory Visit (INDEPENDENT_AMBULATORY_CARE_PROVIDER_SITE_OTHER): Payer: Medicare Other

## 2023-07-17 ENCOUNTER — Encounter (INDEPENDENT_AMBULATORY_CARE_PROVIDER_SITE_OTHER): Payer: Self-pay | Admitting: Vascular Surgery

## 2023-07-17 VITALS — BP 141/66 | HR 65 | Resp 16 | Wt 161.8 lb

## 2023-07-17 DIAGNOSIS — I6522 Occlusion and stenosis of left carotid artery: Secondary | ICD-10-CM

## 2023-07-17 DIAGNOSIS — I6523 Occlusion and stenosis of bilateral carotid arteries: Secondary | ICD-10-CM | POA: Diagnosis not present

## 2023-07-17 DIAGNOSIS — I639 Cerebral infarction, unspecified: Secondary | ICD-10-CM

## 2023-08-10 ENCOUNTER — Emergency Department
Admission: EM | Admit: 2023-08-10 | Discharge: 2023-08-10 | Disposition: A | Discharge status: Home / Self Care | Attending: Emergency Medicine | Admitting: Emergency Medicine

## 2023-08-10 ENCOUNTER — Emergency Department

## 2023-08-10 ENCOUNTER — Other Ambulatory Visit: Payer: Self-pay

## 2023-08-10 DIAGNOSIS — R102 Pelvic and perineal pain: Secondary | ICD-10-CM | POA: Diagnosis not present

## 2023-08-10 DIAGNOSIS — R300 Dysuria: Secondary | ICD-10-CM | POA: Diagnosis present

## 2023-08-10 DIAGNOSIS — N390 Urinary tract infection, site not specified: Secondary | ICD-10-CM | POA: Diagnosis not present

## 2023-08-10 LAB — COMPREHENSIVE METABOLIC PANEL WITH GFR
ALT: 21 U/L (ref 0–44)
AST: 35 U/L (ref 15–41)
Albumin: 4.3 g/dL (ref 3.5–5.0)
Alkaline Phosphatase: 55 U/L (ref 38–126)
Anion gap: 11 (ref 5–15)
BUN: 21 mg/dL (ref 8–23)
CO2: 20 mmol/L — ABNORMAL LOW (ref 22–32)
Calcium: 9.5 mg/dL (ref 8.9–10.3)
Chloride: 105 mmol/L (ref 98–111)
Creatinine, Ser: 0.81 mg/dL (ref 0.44–1.00)
GFR, Estimated: >60 mL/min (ref >60)
Glucose, Bld: 165 mg/dL — ABNORMAL HIGH (ref 70–99)
Potassium: 3.8 mmol/L (ref 3.5–5.1)
Sodium: 136 mmol/L (ref 135–145)
Total Bilirubin: 1.1 mg/dL (ref 0.0–1.2)
Total Protein: 7.7 g/dL (ref 6.5–8.1)

## 2023-08-10 LAB — URINALYSIS, ROUTINE W REFLEX MICROSCOPIC
Bilirubin Urine: NEGATIVE
Glucose, UA: NEGATIVE mg/dL
Ketones, ur: NEGATIVE mg/dL
Nitrite: NEGATIVE
Protein, ur: >=300 mg/dL — AB
RBC / HPF: >50 RBC/hpf (ref 0–5)
Specific Gravity, Urine: 1.017 (ref 1.005–1.030)
WBC, UA: >50 WBC/hpf (ref 0–5)
pH: 5 (ref 5.0–8.0)

## 2023-08-10 LAB — CBC WITH DIFFERENTIAL/PLATELET
Abs Immature Granulocytes: 0.02 10*3/uL (ref 0.00–0.07)
Basophils Absolute: 0 10*3/uL (ref 0.0–0.1)
Basophils Relative: 0 %
Eosinophils Absolute: 0.1 10*3/uL (ref 0.0–0.5)
Eosinophils Relative: 2 %
HCT: 38.5 % (ref 36.0–46.0)
Hemoglobin: 13.2 g/dL (ref 12.0–15.0)
Immature Granulocytes: 0 %
Lymphocytes Relative: 19 %
Lymphs Abs: 1.3 10*3/uL (ref 0.7–4.0)
MCH: 29.2 pg (ref 26.0–34.0)
MCHC: 34.3 g/dL (ref 30.0–36.0)
MCV: 85.2 fL (ref 80.0–100.0)
Monocytes Absolute: 0.6 10*3/uL (ref 0.1–1.0)
Monocytes Relative: 9 %
Neutro Abs: 4.7 10*3/uL (ref 1.7–7.7)
Neutrophils Relative %: 70 %
Platelets: 251 10*3/uL (ref 150–400)
RBC: 4.52 MIL/uL (ref 3.87–5.11)
RDW: 13.1 % (ref 11.5–15.5)
WBC: 6.7 10*3/uL (ref 4.0–10.5)
nRBC: 0 % (ref 0.0–0.2)

## 2023-08-10 MED ORDER — MORPHINE SULFATE (PF) 4 MG/ML IV SOLN
4.0000 mg | Freq: Once | INTRAVENOUS | Status: AC
  Administered 2023-08-10: 4 mg via INTRAVENOUS
  Filled 2023-08-10: qty 1

## 2023-08-10 MED ORDER — KETOROLAC TROMETHAMINE 30 MG/ML IJ SOLN
15.0000 mg | Freq: Once | INTRAMUSCULAR | Status: AC
  Administered 2023-08-10: 15 mg via INTRAVENOUS
  Filled 2023-08-10: qty 1

## 2023-08-10 MED ORDER — CEFADROXIL 500 MG PO CAPS: 1000.0000 mg | ORAL_CAPSULE | Freq: Two times a day (BID) | ORAL | 0 refills | Status: AC

## 2023-08-10 MED ORDER — ONDANSETRON HCL 4 MG/2ML IJ SOLN
4.0000 mg | INTRAMUSCULAR | Status: AC
  Administered 2023-08-10: 4 mg via INTRAVENOUS
  Filled 2023-08-10: qty 2

## 2023-08-10 MED ORDER — SODIUM CHLORIDE 0.9 % IV SOLN
1.0000 g | Freq: Once | INTRAVENOUS | Status: AC
  Administered 2023-08-10: 1 g via INTRAVENOUS
  Filled 2023-08-10: qty 10

## 2023-08-10 MED ORDER — IOHEXOL 300 MG/ML  SOLN
100.0000 mL | Freq: Once | INTRAMUSCULAR | Status: AC | PRN
  Administered 2023-08-10: 100 mL via INTRAVENOUS

## 2023-08-10 NOTE — ED Notes (Signed)
 This RN introduced self to pt. Call light in reach, bed wheels locked, side rail raised, pt updated on plan of care. Rounding completed.

## 2023-08-10 NOTE — ED Provider Notes (Signed)
 [facility] Provider Note    Event Date/Time   First MD Initiated Contact with Patient 08/10/23 [phone]     (approximate)   History   Dysuria   HPI Kimberly Trevino is a 78 y.o. female who presents for evaluation of painful urination, increased urinary frequency, and severe pain in her suprapubic region that has gotten worse over the last day.  A week or more ago she was diagnosed with a UTI and completed a course of treatment.  However the symptoms came back 2 days ago and she was started on nitrofurantoin.  She has taken about 4 doses of it, but her symptoms have steadily gotten worse.  She is very uncomfortable now and is groaning and grunting.  She said that the pain is a sharp and stabbing pain that feels inside her urethra.  She is not having any upper abdominal or flank pain.  She denies fever, chest pain, shortness of breath, nausea, and vomiting.     Physical Exam   Triage Vital Signs: ED Triage Vitals  Encounter Vitals Group     BP 08/10/23 0328 (!) 153/102     Systolic BP Percentile --      Diastolic BP Percentile --      Pulse Rate 08/10/23 0328 78     Resp 08/10/23 0328 (!) 28     Temp 08/10/23 0328 98.6 F (37 C)     Temp Source 08/10/23 0328 Oral     SpO2 08/10/23 0328 96 %     Weight 08/10/23 0318 73.9 kg (163 lb)     Height 08/10/23 0318 1.549 m (5\' 1" )     Head Circumference --      Peak Flow --      Pain Score 08/10/23 0328 10     Pain Loc --      Pain Education --      Exclude from Growth Chart --     Most recent vital signs: Vitals:   08/10/23 0630 08/10/23 0700  BP: (!) 109/57 120/64  Pulse: 63 67  Resp:  18  Temp:    SpO2: 97% 99%    General: Awake, appears quite uncomfortable and in pain. CV:  Good peripheral perfusion.  Regular rate and rhythm. Resp:  Normal effort. Speaking easily and comfortably, no accessory muscle usage nor intercostal retractions.   Abd:  No distention.  No tenderness to palpation of the  abdomen but the patient has pain at the area of her mons pubis and vulva.  However she describes it is on the inside.   ED Results / Procedures / Treatments   Labs (all labs ordered are listed, but only abnormal results are displayed) Labs Reviewed  URINALYSIS, ROUTINE W REFLEX MICROSCOPIC - Abnormal; Notable for the following components:      Result Value   Color, Urine YELLOW (*)    APPearance CLOUDY (*)    Hgb urine dipstick LARGE (*)    Protein, ur >=300 (*)    Leukocytes,Ua MODERATE (*)    Bacteria, UA MANY (*)    All other components within normal limits  COMPREHENSIVE METABOLIC PANEL WITH GFR - Abnormal; Notable for the following components:   CO2 20 (*)    Glucose, Bld 165 (*)    All other components within normal limits  URINE CULTURE  CBC WITH DIFFERENTIAL/PLATELET      RADIOLOGY See hospital course for details regarding CT abdomen/pelvis results   PROCEDURES:  Critical Care performed: No  Procedures    IMPRESSION / MDM / ASSESSMENT AND PLAN / ED COURSE  I reviewed the triage vital signs and the nursing notes.                              Differential diagnosis includes, but is not limited to, UTI, pyelonephritis, renal or ureteral stone, organ prolapse (bladder or uterus), diverticulitis, appendicitis, yeast infection, viral/herpes infection..  Patient's presentation is most consistent with acute presentation with potential threat to life or bodily function.  Labs/studies ordered: Urinalysis, CMP, CBC with differential, urine culture, CT of the abdomen and pelvis  Interventions/Medications given:  Medications  morphine (PF) 4 MG/ML injection 4 mg (4 mg Intravenous Given 08/10/23 0539)  ketorolac (TORADOL) 30 MG/ML injection 15 mg (15 mg Intravenous Given 08/10/23 0539)  ondansetron (ZOFRAN) injection 4 mg (4 mg Intravenous Given 08/10/23 0539)  iohexol (OMNIPAQUE) 300 MG/ML solution 100 mL (100 mLs Intravenous Contrast Given 08/10/23 0547)    (Note:   hospital course my include additional interventions and/or labs/studies not listed above.)   Vital signs are stable within normal limits other than hypertension which I suspect are the result of the patient's pain.  Similarly she is a bit tachypneic but that is because of the way she is breathing due to grunting and holding her breath from the pain.  Lab work is notable for a grossly positive urinalysis and I have ordered a urine culture.  CMP is essentially normal other than very slightly decreased CO2.  CBC is normal.  I ordered medications as listed above including ceftriaxone 1 g IV to begin treatment of the UTI.  I ordered a CT of the abdomen pelvis to evaluate for the possibility of an obstructive stone as well as to evaluate for pyelonephritis or other cause for her pelvic pain.  She agrees with the current plan.   Clinical Course as of 08/10/23 0713  Thu Aug 10, 2023  0710 Transferring ED care to Dr. Larinda Buttery to follow up on CT scan and reassess. [CF]    Clinical Course User Index [CF] Loleta Rose, MD     FINAL CLINICAL IMPRESSION(S) / ED DIAGNOSES   Final diagnoses:  Pelvic pain  Urinary tract infection with hematuria, site unspecified     Rx / DC Orders   ED Discharge Orders          Ordered    cefadroxil (DURICEF) 500 MG capsule  2 times daily        08/10/23 0712             Note:  This document was prepared using Dragon voice recognition software and may include unintentional dictation errors.   Loleta Rose, MD 08/10/23 [phone]

## 2023-08-10 NOTE — ED Notes (Signed)
 This RN gave report to [facility] and performed care handoff. Call light in reach, bed wheels locked, side rail raised, pt updated on plan of care. Rounding completed.

## 2023-08-10 NOTE — Discharge Instructions (Addendum)
 Please follow-up with your Urologist or regular doctor at the next available opportunity.  You can stop taking the Macrobid (nitrofurantoin), but please take the full course of the new prescription prescribed tonight which is called Duricef (cefadroxil).  Please take any other medications prescribed as well and continue taking your regular medications.  Return to the emergency department if you develop new or worsening symptoms that concern you.

## 2023-08-10 NOTE — ED Provider Notes (Signed)
-----------------------------------------   7:06 AM on 08/10/2023 -----------------------------------------  Blood pressure (!) 109/57, pulse 63, temperature 98.6 F (37 C), temperature source Oral, resp. rate (!) 28, height 5\' 1"  (1.549 m), weight 73.9 kg, SpO2 97%.  Assuming care from Dr. York Cerise.  In short, Kimberly Trevino is a 78 y.o. female with a chief complaint of Dysuria .  Refer to the original H&P for additional details.  The current plan of care is to follow-up CT results for abdominal pain and possible UTI.  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  No findings concerning for pyelonephritis and I reviewed these findings with Dr. 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  He agrees that patient is appropriate for outpatient management with antibiotics for cystitis, will arrange follow-up for her with urology as well.  She was counseled to return to the ED for new or worsening symptoms.  Patient agrees with plan.     Chesley Noon, MD 08/10/23 [phone]

## 2023-08-10 NOTE — ED Triage Notes (Signed)
 Pt arrives via POV from home with CC of painful urination, increased urinary frequency, and severe pelvic floor pressure. Pt was dx with UTI on 3/25 and started taking Nitrofurantn yesterday. Pt seems very uncomfortable in triage and is rocking back and forth while moaning in pain.

## 2023-08-11 LAB — URINE CULTURE: Culture: NO GROWTH

## 2023-08-13 ENCOUNTER — Other Ambulatory Visit: Payer: Self-pay

## 2023-08-13 ENCOUNTER — Encounter: Payer: Self-pay | Admitting: Emergency Medicine

## 2023-08-13 ENCOUNTER — Emergency Department
Admission: EM | Admit: 2023-08-13 | Discharge: 2023-08-13 | Disposition: A | Discharge status: Home / Self Care | Attending: Emergency Medicine | Admitting: Emergency Medicine

## 2023-08-13 DIAGNOSIS — I10 Essential (primary) hypertension: Secondary | ICD-10-CM | POA: Diagnosis not present

## 2023-08-13 DIAGNOSIS — T464X5A Adverse effect of angiotensin-converting-enzyme inhibitors, initial encounter: Secondary | ICD-10-CM | POA: Insufficient documentation

## 2023-08-13 DIAGNOSIS — Z79899 Other long term (current) drug therapy: Secondary | ICD-10-CM | POA: Insufficient documentation

## 2023-08-13 DIAGNOSIS — T783XXA Angioneurotic edema, initial encounter: Secondary | ICD-10-CM | POA: Diagnosis present

## 2023-08-13 MED ORDER — TRANEXAMIC ACID-NACL 1000-0.7 MG/100ML-% IV SOLN
1000.0000 mg | INTRAVENOUS | Status: DC
  Filled 2023-08-13: qty 100

## 2023-08-13 MED ORDER — VALSARTAN 40 MG PO TABS: 40.0000 mg | ORAL_TABLET | Freq: Every day | ORAL | 11 refills | Status: DC

## 2023-08-13 MED ORDER — TRANEXAMIC ACID-NACL 1000-0.7 MG/100ML-% IV SOLN
1000.0000 mg | Freq: Once | INTRAVENOUS | Status: AC
  Administered 2023-08-13: 1000 mg via INTRAVENOUS
  Filled 2023-08-13: qty 100

## 2023-08-13 MED ORDER — EPINEPHRINE 0.3 MG/0.3ML IJ SOAJ
0.3000 mg | Freq: Once | INTRAMUSCULAR | Status: AC
  Administered 2023-08-13: 0.3 mg via INTRAMUSCULAR
  Filled 2023-08-13: qty 0.3

## 2023-08-13 MED ORDER — PREDNISONE 10 MG (21) PO TBPK: ORAL_TABLET | ORAL | 0 refills | Status: DC

## 2023-08-13 MED ORDER — METHYLPREDNISOLONE SODIUM SUCC 125 MG IJ SOLR
125.0000 mg | Freq: Once | INTRAMUSCULAR | Status: AC
  Administered 2023-08-13: 125 mg via INTRAVENOUS
  Filled 2023-08-13: qty 2

## 2023-08-13 MED ORDER — DIPHENHYDRAMINE HCL 50 MG/ML IJ SOLN
50.0000 mg | Freq: Once | INTRAMUSCULAR | Status: AC
Start: 2023-08-13 — End: 2023-08-13
  Administered 2023-08-13: 50 mg via INTRAVENOUS
  Filled 2023-08-13: qty 1

## 2023-08-13 MED ORDER — DIPHENHYDRAMINE HCL 25 MG PO CAPS: 25.0000 mg | ORAL_CAPSULE | Freq: Three times a day (TID) | ORAL | 0 refills | Status: DC

## 2023-08-13 NOTE — ED Notes (Signed)
 Medical Necessity charted under wrong pt

## 2023-08-13 NOTE — ED Provider Notes (Signed)
 [facility] Provider Note   Event Date/Time   First MD Initiated Contact with Patient 08/13/23 [phone]     (approximate) History  Angioedema  HPI Verta Riedlinger is a 78 y.o. female with stated past medical history of hypertension on lisinopril who presents after waking up with tongue swelling this morning.  Patient states the right side of her tongue is swollen.  Patient is in the difficulty passing air however does endorse mild difficulty swallowing.  Patient tolerating secretions without difficulty.  Patient states last dosage of lisinopril was yesterday. ROS: Patient currently denies any vision changes, tinnitus, difficulty speaking, facial droop, sore throat, chest pain, shortness of breath, abdominal pain, nausea/vomiting/diarrhea, dysuria, or weakness/numbness/paresthesias in any extremity   Physical Exam  Triage Vital Signs: ED Triage Vitals  Encounter Vitals Group     BP 08/13/23 0657 (!) 152/76     Systolic BP Percentile --      Diastolic BP Percentile --      Pulse Rate 08/13/23 0657 66     Resp 08/13/23 0700 11     Temp --      Temp src --      SpO2 08/13/23 0657 100 %     Weight --      Height --      Head Circumference --      Peak Flow --      Pain Score 08/13/23 0656 0     Pain Loc --      Pain Education --      Exclude from Growth Chart --    Most recent vital signs: Vitals:   08/13/23 1101 08/13/23 1140  BP:  (!) 145/67  Pulse:  61  Resp:  (!) 24  Temp: 98.9 F (37.2 C)   SpO2:  97%   General: Awake, oriented x4. CV:  Good peripheral perfusion.  Resp:  Normal effort.  Abd:  No distention.  Other:  Elderly obese African-American female resting comfortably in no acute distress.  Right sided tongue swelling with patent airway and no stridor ED Results / Procedures / Treatments  Labs (all labs ordered are listed, but only abnormal results are displayed) Labs Reviewed - No data to display  PROCEDURES: Critical Care  performed: Yes, see critical care procedure note(s) Procedures MEDICATIONS ORDERED IN ED: Medications  EPINEPHrine (EPI-PEN) injection 0.3 mg (0.3 mg Intramuscular Given 08/13/23 0702)  methylPREDNISolone sodium succinate (SOLU-MEDROL) 125 mg/2 mL injection 125 mg (125 mg Intravenous Given 08/13/23 0704)  diphenhydrAMINE (BENADRYL) injection 50 mg (50 mg Intravenous Given 08/13/23 0704)  tranexamic acid (CYKLOKAPRON) IVPB 1,000 mg (0 mg Intravenous Stopped 08/13/23 0748)   IMPRESSION / MDM / ASSESSMENT AND PLAN / ED COURSE  I reviewed the triage vital signs and the nursing notes.                             The patient is on the cardiac monitor to evaluate for evidence of arrhythmia and/or significant heart rate changes. Patient's presentation is most consistent with acute presentation with potential threat to life or bodily function. Patient 78 year old female who presents for tongue swelling in the setting of lisinopril use.  Differential diagnosis includes but is not limited to hereditary angioedema, ACE inhibitor induced angioedema, anaphylaxis  Tx: Epinephrine, 125 Solu-Medrol, 50 mg Benadryl, 1 g TXA  Patient has had no airway compromise throughout her emergency department course.  Patient is been monitored for a  full 6 hours without any recurrence of her symptoms.  As half-life of epinephrine is much less than this, it was discussed with patient regarding admission versus discharge.  Patient states that her tongue feels almost back to normal and wishes to be discharged at this time with strict return precautions.  Dispo: Discharge home with PCP follow-up in 1-3 days   FINAL CLINICAL IMPRESSION(S) / ED DIAGNOSES   Final diagnoses:  ACE inhibitor-aggravated angioedema, initial encounter   Rx / DC Orders   ED Discharge Orders          Ordered    predniSONE (STERAPRED UNI-PAK 21 TAB) 10 MG (21) TBPK tablet        08/13/23 1225    diphenhydrAMINE (BENADRYL ALLERGY) 25 mg capsule   Every 8 hours        08/13/23 1225    valsartan (DIOVAN) 40 MG tablet  Daily        08/13/23 1225           Note:  This document was prepared using Dragon voice recognition software and may include unintentional dictation errors.   Merwyn Katos, MD 08/13/23 [phone]

## 2023-08-13 NOTE — ED Notes (Signed)
 Pt given ice chips

## 2023-08-13 NOTE — ED Notes (Signed)
 Daughter called back, given update, stated she would come to see pt soon.

## 2023-08-13 NOTE — ED Triage Notes (Signed)
 Pt arrives from home woke this morning with severe tongue swelling. Pt reports she takes lisinopril.  Reports she is still able to move air well.  Hard to understand d/t tongue swelling.  EDP at bedside.

## 2023-08-13 NOTE — Discharge Instructions (Addendum)
 Please return if any tongue swelling comes back

## 2023-08-13 NOTE — ED Notes (Signed)
 Pt asking for daughter to be called and informed pt is here. Called daughter and left HIPAA compliant VM.

## 2023-09-13 ENCOUNTER — Ambulatory Visit: Admitting: Urology

## 2023-09-13 VITALS — BP 134/75 | HR 68 | Ht 62.0 in | Wt 163.0 lb

## 2023-09-13 DIAGNOSIS — N281 Cyst of kidney, acquired: Secondary | ICD-10-CM | POA: Diagnosis not present

## 2023-09-13 DIAGNOSIS — N390 Urinary tract infection, site not specified: Secondary | ICD-10-CM | POA: Diagnosis not present

## 2023-09-13 DIAGNOSIS — R3129 Other microscopic hematuria: Secondary | ICD-10-CM | POA: Diagnosis not present

## 2023-09-13 DIAGNOSIS — N952 Postmenopausal atrophic vaginitis: Secondary | ICD-10-CM

## 2023-09-13 LAB — URINALYSIS, COMPLETE
Bilirubin, UA: NEGATIVE
Glucose, UA: NEGATIVE
Ketones, UA: NEGATIVE
Leukocytes,UA: NEGATIVE
Nitrite, UA: NEGATIVE
Specific Gravity, UA: 1.02 (ref 1.005–1.030)
Urobilinogen, Ur: 1 mg/dL (ref 0.2–1.0)
pH, UA: 5.5 (ref 5.0–7.5)

## 2023-09-13 LAB — MICROSCOPIC EXAMINATION: Epithelial Cells (non renal): >10 /HPF — AB (ref 0–10)

## 2023-09-13 MED ORDER — FLUCONAZOLE 150 MG PO TABS: 150.0000 mg | ORAL_TABLET | Freq: Once | ORAL | 4 refills | Status: AC

## 2023-09-13 MED ORDER — CEPHALEXIN 250 MG PO CAPS: ORAL_CAPSULE | ORAL | 0 refills | Status: DC

## 2023-09-13 NOTE — Progress Notes (Unsigned)
 09/13/2023 11:36 AM   Kimberly Trevino July 28, 1945 [id number]  Referring provider: Claudine Cullens, MD [address] [place],  [place] [address]  Urological history: 1.  Mixed incontinence - Failed anticholinergic therapy - Failed PTNS - Myrbetriq cost prohibitive  2.  Recurrent UTIs - Cystoscopy (2019) - NED - E. coli August 29, 2023 - No growth August 10, 2023 - E. coli August 08, 2023 - E. coli March 03, 2023 - E. coli December 19, 2022 - E. coli November 16, 2022  3.  Pelvic floor laxity - Grade 2 cystocele and grade 1 rectocele  4.  Peripelvic cysts - contrast CT (07/2023) - right UPJ obstruction versus peripelvic cysts - Serum creatinine (07/2023) 0.81  Chief Complaint  Patient presents with   Hospitalization Follow-up   HPI: Kimberly Trevino is a 78 y.o. woman who presents today for follow-up from the hospital.  Previous records reviewed.   Per the ED notes on August 10, 2023 she had painful urination, increased urinary frequency and severe pain in the suprapubic region along with a sharp and stabbing pain inside her urethra.  She was diagnosed with a urinary tract infection and completed a course of antibiotics a week prior to seeking further treatment in the ED.  She did contact her PCPs office stating her symptoms had returned and she was placed on Macrobid  however her symptoms continued to worsen after 4 doses of the Macrobid .  Her urine culture from August 08, 2023 was positive for pansensitive E. coli.   She was placed on Omnicef.  She had a contrast CT which was suspicious for right UPJ obstruction at first, but at a closer examination there is more likely peripelvic cysts.  Her serum creatinine was normal at 0.81.  She then had recurrence of the symptoms and then again grew out a pansensitive E. coli in August 29, 2023 when she was treated with culture appropriate antibiotics.  Today,  she is not having any symptoms of UTI.  She had been on oral estrogen and  vaginal estrogen cream, but it was discontinued back in January after she was hospitalized for a TIA.   Her urinalysis color yellow clear, specific already 1.020, trace heme, pH 5.5, 2+ protein, send 5 WBCs, 3-10 RBC's, greater than 10 epithelial cells, mucus at present and a few bacteria.  She is having baseline frequency and urgency.  Patient denies any modifying or aggravating factors.  Patient denies any recent UTI's, gross hematuria, dysuria or suprapubic/flank pain.  Patient denies any fevers, chills, nausea or vomiting.    PMH: Past Medical History:  Diagnosis Date   Acute ischemic left MCA stroke (HCC) 04/11/2016   a.) small LEFT frontal MCA territory infarct   Anxiety    Arthritis    Asthma    B12 deficiency    Carotid stenosis, bilateral    a.) Carotid doppler 04/10/2016: mild (1-49%) BILATERAL ICAs.   Chronic low back pain    Colon polyp    Coronary artery disease 2011   a.) LHC 2011: 50% mLAD; no intervention opting for med mgmt. b.) LHC 06/04/2020: 70% p-mLAD, 90% oD1, 70% RI, 70-80% Cx marginal branch, 60% RCA; anatomy not ideally suited for PCI --> CVTS consult for CABG consideration recommended for refractory symptoms.   Depression    Diastolic dysfunction    a.)  TTE 04/11/2016: EF 60%; no RWMAs; mild MR; mild LA dilation; G1DD.   Female cystocele    GERD (gastroesophageal reflux disease)  Hyperlipidemia    Hypertension    Hypothyroidism    Incontinence in female    Long term current use of antithrombotics/antiplatelets    a.) DAPT therapy (ASA + clopidogrel )   OAB (overactive bladder)    Obesity    Paresthesias in left hand    PSVT (paroxysmal supraventricular tachycardia) (HCC)    Tachycardia 2010   TIA (transient ischemic attack)    Vitamin D deficiency     Surgical History: Past Surgical History:  Procedure Laterality Date   ABDOMINAL HYSTERECTOMY     CARDIAC CATHETERIZATION  05/16/2009   50% to the mid LAD lesion   CAROTID PTA/STENT INTERVENTION Left  06/07/2023   Procedure: CAROTID PTA/STENT INTERVENTION;  Surgeon: Kimberly Mass, MD;  Location: [facility];  Service: Cardiovascular;  Laterality: Left;   CARPAL TUNNEL RELEASE Bilateral    CERVICAL FUSION     COLONOSCOPY WITH PROPOFOL  N/A 05/21/2021   Procedure: COLONOSCOPY WITH PROPOFOL ;  Surgeon: Kimberly Darling, MD;  Location: [facility] ENDOS[facility];  Service: Endoscopy;  Laterality: N/A;  PLAVIX    KNEE ARTHROSCOPY Left    PARTIAL HYSTERECTOMY  05/16/1997   REVERSE SHOULDER ARTHROPLASTY Right 08/17/2021   Procedure: REVERSE SHOULDER ARTHROPLASTY WITH BICEPS TENODESIS;  Surgeon: Kimberly Hahn, MD;  Location: [facility] ORS;  Service: Orthopedics;  Laterality: Right;   TEE WITHOUT CARDIOVERSION N/A 06/13/2016   Procedure: TRANSESOPHAGEAL ECHOCARDIOGRAM (TEE);  Surgeon: Kimberly Hamilton, MD;  Location: [facility] OR[facility];  Service: Cardiovascular;  Laterality: N/A;   TONSILLECTOMY     TUBAL LIGATION      Home Medications:  Allergies as of 09/13/2023       Reactions   Doxycycline    Angioedema + itching   Sulfa Antibiotics Hives   Welps   Gemtesa  [vibegron ] Diarrhea        Medication List        Accurate as of September 13, 2023 11:59 PM. If you have any questions, ask your nurse or doctor.          acetaminophen  325 MG tablet Commonly known as: TYLENOL  Take 2 tablets (650 mg total) by mouth every 6 (six) hours as needed for mild pain (or Fever >/= 101).   ascorbic acid  500 MG tablet Commonly known as: VITAMIN C  Take 500 mg by mouth daily.   aspirin  EC 81 MG tablet Take 2 tablets (162 mg total) by mouth daily. Swallow whole.   azelastine 0.1 % nasal spray Commonly known as: ASTELIN Place 1 spray into both nostrils 2 (two) times daily as needed for rhinitis or allergies. Use in each nostril as directed   buPROPion 150 MG 24 hr tablet Commonly known as: WELLBUTRIN XL Take 150 mg by mouth daily.   cephALEXin  250 MG capsule Commonly known as: KEFLEX  Take 1 tablet nightly    clopidogrel  75 MG tablet Commonly known as: PLAVIX  Take 1 tablet (75 mg total) by mouth daily.   Dilt-XR 240 MG 24 hr capsule Generic drug: diltiazem  Take 1 capsule by mouth daily.   diphenhydrAMINE  25 mg capsule Commonly known as: Benadryl  Allergy Take 1 capsule (25 mg total) by mouth every 8 (eight) hours for 3 days.   docusate sodium  100 MG capsule Commonly known as: COLACE Take 100 mg by mouth daily.   EQ Eye Allergy Relief 0.027-0.315 % Soln Generic drug: Naphazoline-Pheniramine Place 1 drop into both eyes daily as needed (eye allergies).   Euthyrox  25 MCG tablet Generic drug: levothyroxine  Take 25 mcg by mouth daily before breakfast.  famotidine  40 MG tablet Commonly known as: PEPCID  Take 40 mg by mouth every morning.   fluconazole  150 MG tablet Commonly known as: DIFLUCAN  Take 1 tablet (150 mg total) by mouth once for 1 dose.   fluticasone-salmeterol 250-50 MCG/ACT Aepb Commonly known as: ADVAIR Inhale 1 puff into the lungs 2 (two) times daily as needed (asthma).   gabapentin  300 MG capsule Commonly known as: NEURONTIN  Take 300 mg by mouth at bedtime.   isosorbide  mononitrate 60 MG 24 hr tablet Commonly known as: IMDUR  Take 60 mg by mouth every evening.   loratadine  10 MG tablet Commonly known as: CLARITIN  Take 10 mg by mouth every morning.   metoprolol  succinate 100 MG 24 hr tablet Commonly known as: TOPROL -XL Take 100 mg by mouth every evening. Take with or immediately following a meal.   multivitamin tablet Take 1 tablet by mouth daily.   predniSONE  10 MG (21) Tbpk tablet Commonly known as: STERAPRED UNI-PAK 21 TAB As directed on packaging   rosuvastatin  40 MG tablet Commonly known as: CRESTOR  Take 40 mg by mouth every evening.   sertraline  50 MG tablet Commonly known as: ZOLOFT  Take 25 mg by mouth every evening.   valsartan  40 MG tablet Commonly known as: Diovan  Take 1 tablet (40 mg total) by mouth daily.        Allergies:   Allergies  Allergen Reactions   Doxycycline     Angioedema + itching    Sulfa Antibiotics Hives    Welps   Gemtesa  [Vibegron ] Diarrhea    Family History: Family History  Problem Relation Age of Onset   Hypertension Mother    Heart attack Father 70       MI   Heart disease Father    Hypertension Father    Hypertension Sister    Hyperlipidemia Sister    Heart attack Brother        cardiac arrest   Hypertension Sister    Hyperlipidemia Sister    Breast cancer Neg Hx     Social History:  reports that she has never smoked. She has never been exposed to tobacco smoke. She has never used smokeless tobacco. She reports that she does not drink alcohol and does not use drugs.  ROS: Pertinent ROS in HPI  Physical Exam: BP 134/75   Pulse 68   Ht 5\' 2"  (1.575 m)   Wt 163 lb (73.9 kg)   BMI 29.81 kg/m   Constitutional:  Well nourished. Alert and oriented, No acute distress. HEENT: [place] AT, moist mucus membranes.  Trachea midline Cardiovascular: No clubbing, cyanosis, or edema. Respiratory: Normal respiratory effort, no increased work of breathing. Neurologic: Grossly intact, no focal deficits, moving all 4 extremities. Psychiatric: Normal mood and affect.  Laboratory Data: Lab Results  Component Value Date   WBC 6.7 08/10/2023   HGB 13.2 08/10/2023   HCT 38.5 08/10/2023   MCV 85.2 08/10/2023   PLT 251 08/10/2023   Lab Results  Component Value Date   CREATININE 0.81 08/10/2023   Lab Results  Component Value Date   HGBA1C 5.8 (H) 06/01/2023      Component Value Date/Time   CHOL 129 06/02/2023 0455   CHOL 157 05/25/2016 0746   HDL 40 (L) 06/02/2023 0455   HDL 40 05/25/2016 0746   CHOLHDL 3.2 06/02/2023 0455   VLDL 40 06/02/2023 0455   LDLCALC 49 06/02/2023 0455   LDLCALC 75 05/25/2016 0746   Lab Results  Component Value Date   AST 35 08/10/2023  Lab Results  Component Value Date   ALT 21 08/10/2023   Urinalysis See EPIC and HPI  I have reviewed the  labs.   Pertinent Imaging: Narrative & Impression  CLINICAL DATA:  Pyelonephritis suspected, painful urination.   EXAM: CT ABDOMEN AND PELVIS WITH CONTRAST   TECHNIQUE: Multidetector CT imaging of the abdomen and pelvis was performed using the standard protocol following bolus administration of intravenous contrast.   RADIATION DOSE REDUCTION: This exam was performed according to the departmental dose-optimization program which includes automated exposure control, adjustment of the mA and/or kV according to patient size and/or use of iterative reconstruction technique.   CONTRAST:  OMNIPAQUE  IOHEXOL  300 MG/ML  SOLN   COMPARISON:  None Available.   FINDINGS: Lower chest:  No contributory findings.   Hepatobiliary: No focal liver abnormality.No evidence of biliary obstruction or stone.   Pancreas: Unremarkable.   Spleen: Unremarkable.   Adrenals/Urinary Tract: Negative adrenals. Right hydronephrosis with renal pelvis distorted by a large renal cyst which appears separate and measures up to 7.5 cm. The cyst appears simple as does a 5.2 cm cyst on the left. There is Trevino effect on the UPJ but the collecting system is dilated even beyond the cyst with kinking and transition at the UPJ where there is some accentuated enhancement but no measurable Trevino. No nephrolithiasis or visible pyelonephritis. The bladder is collapsed with thick wall and prominent mucosal enhancement. Soft tissue swelling surrounds the bladder.   Stomach/Bowel: Numerous distal colonic diverticula. No bowel obstruction or visible inflammation. No appendicitis.   Vascular/Lymphatic: Extensive atheromatous calcification of the aorta. No Trevino or adenopathy.   Reproductive:Hysterectomy.   Other: No ascites or pneumoperitoneum.   Musculoskeletal: No acute abnormalities. Multilevel disc and facet degeneration with mild T9-10 and T11-12 anterolisthesis. Gas containing disc herniation with upward  migration at L3-4 where there is moderate spinal stenosis.   IMPRESSION: 1. Cystitis, correlate with urinalysis. 2. Right hydronephrosis with caliber change at the UPJ where there is possible stricture. A large right renal hilar cyst also deforms the right renal collecting system. No evidence of pyelonephritis. 3. Atherosclerosis and colonic diverticulosis.     Electronically Signed   By: Ronnette Coke M.D.   On: 08/10/2023 07:46  I have independently reviewed the films.  See HPI.    Assessment & Plan:  ***  1. Recurrent UTI (Primary) - Urinalysis, Complete, w/ micro heme - Since her estrogen cream has been discontinued we will have a trial of Keflex  250 mg daily for 90 days to see if we can stop her episodes of recurrent urinary tract infections and I will also investigate as to why her estrogen cream was discontinued and restart it if it is appropriate  2. GSM - I will investigate the reason as to why her vaginal cream was stopped and we will restart it if appropriate  3. Parapelvic right renal cysts - The cysts are benign and her serum creatinine is within normal levels - No further surveillance is recommended for benign cyst, but we will continue to monitor serum creatinine as these cysts are  large and in the pelvic region and could cause obstruction in the future  4. Microscopic hematuria     Return in about 3 months (around 12/13/2023) for UA, OAB questionare and PVR .  These notes generated with voice recognition software. I apologize for typographical errors.  Briant Camper  [facility] [address]  [address] [place], [place] [address] [phone]

## 2023-09-16 ENCOUNTER — Encounter: Payer: Self-pay | Admitting: Urology

## 2023-10-03 ENCOUNTER — Encounter (INDEPENDENT_AMBULATORY_CARE_PROVIDER_SITE_OTHER): Payer: Self-pay

## 2023-10-25 ENCOUNTER — Ambulatory Visit: Payer: Self-pay | Admitting: Urology

## 2023-12-19 NOTE — Progress Notes (Unsigned)
 12/20/2023 2:23 PM   [place] Kimberly Trevino 09/28/1945 [id number]  Referring provider: Derick Leita POUR, MD [address] [place],  [place] [address]  Urological history: 1.  Mixed incontinence - Failed anticholinergic therapy - Failed PTNS - Myrbetriq cost prohibitive  2.  Recurrent UTIs - Cystoscopy (2019) - NED  3.  Pelvic floor laxity - Grade 2 cystocele and grade 1 rectocele  4.  Peripelvic cysts - contrast CT (07/2023) - right UPJ obstruction versus peripelvic cysts - Serum creatinine (07/2023) 0.81  Chief Complaint  Patient presents with   Medical Management of Chronic Issues   HPI: Kimberly Trevino is a 78 y.o. woman who presents today for 3 month follow up.   Previous records reviewed.   She was placed on prophylactic Keflex  250 mg daily for rUTI's as she was taken off her estrogen cream secondary to TIAs.   She completed the Keflex  250 mg daily.  She has the estrogen cream medication, but she has not been applying it.  She continues to have issues with urge incontinence and a slight pain in her urethra.   Patient denies any modifying or aggravating factors.  Patient denies any recent UTI's, gross hematuria, dysuria or suprapubic/flank pain.  Patient denies any fevers, chills, nausea or vomiting.    UA positive for leukocytosis, hematuria and bacteria  PVR 104 mL   She is wanting to know why she is having so many bladder infections.  PMH: Past Medical History:  Diagnosis Date   Acute ischemic left MCA stroke (HCC) 04/11/2016   a.) small LEFT frontal MCA territory infarct   Anxiety    Arthritis    Asthma    B12 deficiency    Carotid stenosis, bilateral    a.) Carotid doppler 04/10/2016: mild (1-49%) BILATERAL ICAs.   Chronic low back pain    Colon polyp    Coronary artery disease 2011   a.) LHC 2011: 50% mLAD; no intervention opting for med mgmt. b.) LHC 06/04/2020: 70% p-mLAD, 90% oD1, 70% RI, 70-80% Cx marginal branch, 60% RCA; anatomy not ideally  suited for PCI --> CVTS consult for CABG consideration recommended for refractory symptoms.   Depression    Diastolic dysfunction    a.)  TTE 04/11/2016: EF 60%; no RWMAs; mild MR; mild LA dilation; G1DD.   Female cystocele    GERD (gastroesophageal reflux disease)    Hyperlipidemia    Hypertension    Hypothyroidism    Incontinence in female    Long term current use of antithrombotics/antiplatelets    a.) DAPT therapy (ASA + clopidogrel )   OAB (overactive bladder)    Obesity    Paresthesias in left hand    PSVT (paroxysmal supraventricular tachycardia) (HCC)    Tachycardia 2010   TIA (transient ischemic attack)    Vitamin D deficiency     Surgical History: Past Surgical History:  Procedure Laterality Date   ABDOMINAL HYSTERECTOMY     CARDIAC CATHETERIZATION  05/16/2009   50% to the mid LAD lesion   CAROTID PTA/STENT INTERVENTION Left 06/07/2023   Procedure: CAROTID PTA/STENT INTERVENTION;  Surgeon: Jama Cordella MATSU, MD;  Location: [facility];  Service: Cardiovascular;  Laterality: Left;   CARPAL TUNNEL RELEASE Bilateral    CERVICAL FUSION     COLONOSCOPY WITH PROPOFOL  N/A 05/21/2021   Procedure: COLONOSCOPY WITH PROPOFOL ;  Surgeon: Maryruth Ole DASEN, MD;  Location: [facility];  Service: Endoscopy;  Laterality: N/A;  PLAVIX    KNEE ARTHROSCOPY Left  PARTIAL HYSTERECTOMY  05/16/1997   REVERSE SHOULDER ARTHROPLASTY Right 08/17/2021   Procedure: REVERSE SHOULDER ARTHROPLASTY WITH BICEPS TENODESIS;  Surgeon: Edie Norleen PARAS, MD;  Location: [facility] OR[facility];  Service: Orthopedics;  Laterality: Right;   TEE WITHOUT CARDIOVERSION N/A 06/13/2016   Procedure: TRANSESOPHAGEAL ECHOCARDIOGRAM (TEE);  Surgeon: Deatrice DELENA Cage, MD;  Location: [facility] OR[facility];  Service: Cardiovascular;  Laterality: N/A;   TONSILLECTOMY     TUBAL LIGATION      Home Medications:  Allergies as of 12/20/2023       Reactions   Doxycycline    Angioedema + itching   Sulfa Antibiotics Hives   Welps   Gemtesa   [vibegron ] Diarrhea        Medication List        Accurate as of December 20, 2023  2:23 PM. If you have any questions, ask your nurse or doctor.          acetaminophen  325 MG tablet Commonly known as: TYLENOL  Take 2 tablets (650 mg total) by mouth every 6 (six) hours as needed for mild pain (or Fever >/= 101).   ascorbic acid  500 MG tablet Commonly known as: VITAMIN C  Take 500 mg by mouth daily.   aspirin  EC 81 MG tablet Take 2 tablets (162 mg total) by mouth daily. Swallow whole.   azelastine 0.1 % nasal spray Commonly known as: ASTELIN Place 1 spray into both nostrils 2 (two) times daily as needed for rhinitis or allergies. Use in each nostril as directed   buPROPion 150 MG 24 hr tablet Commonly known as: WELLBUTRIN XL Take 150 mg by mouth daily.   Calcium  + D3 250-3 MG-MCG Tabs 1 tablet Orally Once a day   cephALEXin  250 MG capsule Commonly known as: KEFLEX  Take 1 tablet nightly   clopidogrel  75 MG tablet Commonly known as: PLAVIX  Take 1 tablet (75 mg total) by mouth daily.   Dilt-XR 240 MG 24 hr capsule Generic drug: diltiazem  Take 1 capsule by mouth daily.   diphenhydrAMINE  25 mg capsule Commonly known as: Benadryl  Allergy Take 1 capsule (25 mg total) by mouth every 8 (eight) hours for 3 days.   docusate sodium  100 MG capsule Commonly known as: COLACE Take 100 mg by mouth daily.   EQ Eye Allergy Relief 0.027-0.315 % Soln Generic drug: Naphazoline-Pheniramine Place 1 drop into both eyes daily as needed (eye allergies).   Euthyrox  25 MCG tablet Generic drug: levothyroxine  Take 25 mcg by mouth daily before breakfast.   famotidine  40 MG tablet Commonly known as: PEPCID  Take 40 mg by mouth every morning.   fluticasone-salmeterol 250-50 MCG/ACT Aepb Commonly known as: ADVAIR Inhale 1 puff into the lungs 2 (two) times daily as needed (asthma).   gabapentin  300 MG capsule Commonly known as: NEURONTIN  Take 300 mg by mouth at bedtime.   isosorbide   mononitrate 60 MG 24 hr tablet Commonly known as: IMDUR  Take 60 mg by mouth every evening.   loratadine  10 MG tablet Commonly known as: CLARITIN  Take 10 mg by mouth every morning.   metFORMIN 750 MG 24 hr tablet Commonly known as: GLUCOPHAGE-XR Take 750 mg by mouth daily with breakfast.   metoprolol  succinate 100 MG 24 hr tablet Commonly known as: TOPROL -XL Take 100 mg by mouth every evening. Take with or immediately following a meal.   multivitamin tablet Take 1 tablet by mouth daily.   predniSONE  10 MG (21) Tbpk tablet Commonly known as: STERAPRED UNI-PAK 21 TAB As directed on packaging   rosuvastatin  40 MG  tablet Commonly known as: CRESTOR  Take 40 mg by mouth every evening.   valsartan  40 MG tablet Commonly known as: Diovan  Take 1 tablet (40 mg total) by mouth daily.        Allergies:  Allergies  Allergen Reactions   Doxycycline     Angioedema + itching    Sulfa Antibiotics Hives    Welps   Gemtesa  [Vibegron ] Diarrhea    Family History: Family History  Problem Relation Age of Onset   Hypertension Mother    Heart attack Father 68       MI   Heart disease Father    Hypertension Father    Hypertension Sister    Hyperlipidemia Sister    Heart attack Brother        cardiac arrest   Hypertension Sister    Hyperlipidemia Sister    Breast cancer Neg Hx     Social History:  reports that she has never smoked. She has never been exposed to tobacco smoke. She has never used smokeless tobacco. She reports that she does not drink alcohol and does not use drugs.  ROS: Pertinent ROS in HPI  Physical Exam: BP 126/74   Pulse 73   Ht 5' 2 (1.575 m)   Wt 161 lb (73 kg)   BMI 29.45 kg/m   Constitutional:  Well nourished. Alert and oriented, No acute distress. HEENT:  AT, moist mucus membranes.  Trachea midline Cardiovascular: No clubbing, cyanosis, or edema. Respiratory: Normal respiratory effort, no increased work of breathing. Neurologic: Grossly  intact, no focal deficits, moving all 4 extremities. Psychiatric: Normal mood and affect.    Laboratory Data: See EPIC and HPI  I have reviewed the labs.   Pertinent Imaging: N/A   Assessment & Plan:    1. Recurrent UTI (Primary) - UA positive for pyuria, hematuria and bacteriuria - Discussed that she may be colonized versus having recurrent urinary tract infections - Encouraged her to start the vaginal estrogen cream applying it 3 nights weekly - I will send the urine for culture and if it grows out a bacteria will treat with culture appropriate antibiotics and schedule cystoscopy - If urine culture is negative, we will move forward with cystoscopy for further evaluation  2. GSM - Start vaginal estrogen cream 3 nights weekly - Discussed that her symptoms may be the result of atrophic vaginitis and that the estrogen cream will address this  3. Parapelvic right renal cysts - No further surveillance is recommended for benign cyst, but we will continue to monitor serum creatinine as these cysts are  large and in the pelvic region and could cause obstruction in the future  4. Microscopic hematuria  - UA w/ micro heme - Will pursue cystoscopy in the future, timing pending on urine culture results  Return for Follow up pending labs.  These notes generated with voice recognition software. I apologize for typographical errors.  CLOTILDA HELON RIGGERS  [facility] [address]  [address] [place], [place] [address] [phone]

## 2023-12-20 ENCOUNTER — Ambulatory Visit: Admitting: Urology

## 2023-12-20 ENCOUNTER — Encounter: Payer: Self-pay | Admitting: Urology

## 2023-12-20 VITALS — BP 126/74 | HR 73 | Ht 62.0 in | Wt 161.0 lb

## 2023-12-20 DIAGNOSIS — R3129 Other microscopic hematuria: Secondary | ICD-10-CM | POA: Diagnosis not present

## 2023-12-20 DIAGNOSIS — N952 Postmenopausal atrophic vaginitis: Secondary | ICD-10-CM | POA: Diagnosis not present

## 2023-12-20 DIAGNOSIS — N390 Urinary tract infection, site not specified: Secondary | ICD-10-CM | POA: Diagnosis not present

## 2023-12-20 LAB — MICROSCOPIC EXAMINATION: WBC, UA: >30 /HPF — AB (ref 0–5)

## 2023-12-20 LAB — URINALYSIS, COMPLETE
Bilirubin, UA: NEGATIVE
Glucose, UA: NEGATIVE
Ketones, UA: NEGATIVE
Nitrite, UA: NEGATIVE
Protein,UA: NEGATIVE
Specific Gravity, UA: 1.01 (ref 1.005–1.030)
Urobilinogen, Ur: 0.2 mg/dL (ref 0.2–1.0)
pH, UA: 6 (ref 5.0–7.5)

## 2023-12-20 LAB — BLADDER SCAN AMB NON-IMAGING: Scan Result: 104

## 2023-12-24 ENCOUNTER — Ambulatory Visit: Payer: Self-pay | Admitting: Urology

## 2023-12-24 DIAGNOSIS — N390 Urinary tract infection, site not specified: Secondary | ICD-10-CM

## 2023-12-24 LAB — CULTURE, URINE COMPREHENSIVE

## 2023-12-24 MED ORDER — CEFPODOXIME PROXETIL 200 MG PO TABS: 200.0000 mg | ORAL_TABLET | Freq: Two times a day (BID) | ORAL | 0 refills | Status: DC

## 2024-01-17 ENCOUNTER — Other Ambulatory Visit (INDEPENDENT_AMBULATORY_CARE_PROVIDER_SITE_OTHER): Payer: Self-pay | Admitting: Vascular Surgery

## 2024-01-17 DIAGNOSIS — I6523 Occlusion and stenosis of bilateral carotid arteries: Secondary | ICD-10-CM

## 2024-01-18 ENCOUNTER — Ambulatory Visit (INDEPENDENT_AMBULATORY_CARE_PROVIDER_SITE_OTHER): Admitting: Vascular Surgery

## 2024-01-18 ENCOUNTER — Ambulatory Visit (INDEPENDENT_AMBULATORY_CARE_PROVIDER_SITE_OTHER)

## 2024-01-18 ENCOUNTER — Encounter (INDEPENDENT_AMBULATORY_CARE_PROVIDER_SITE_OTHER): Payer: Self-pay | Admitting: Vascular Surgery

## 2024-01-18 ENCOUNTER — Encounter (INDEPENDENT_AMBULATORY_CARE_PROVIDER_SITE_OTHER): Payer: Self-pay

## 2024-01-18 VITALS — BP 112/68 | HR 58 | Ht 62.0 in | Wt 157.0 lb

## 2024-01-18 DIAGNOSIS — E782 Mixed hyperlipidemia: Secondary | ICD-10-CM

## 2024-01-18 DIAGNOSIS — I6522 Occlusion and stenosis of left carotid artery: Secondary | ICD-10-CM

## 2024-01-18 DIAGNOSIS — I6523 Occlusion and stenosis of bilateral carotid arteries: Secondary | ICD-10-CM | POA: Diagnosis not present

## 2024-01-18 DIAGNOSIS — I25119 Atherosclerotic heart disease of native coronary artery with unspecified angina pectoris: Secondary | ICD-10-CM | POA: Diagnosis not present

## 2024-01-18 DIAGNOSIS — I10 Essential (primary) hypertension: Secondary | ICD-10-CM | POA: Diagnosis not present

## 2024-01-18 NOTE — Progress Notes (Signed)
 MRN : [id number]  Kimberly Trevino is a 78 y.o. ([birth year]) female who presents with chief complaint of check carotid arteries.  History of Present Illness:  The patient is seen for follow up evaluation of carotid stenosis status post left carotid stent placement on 06/07/2023.  There were no post operative problems or complications related to the surgery.  The patient denies neck or incisional pain.   Procedure:  Placement of a 9 mm x 7 mm x 40 mm exact stent with the use of the NAV-6 embolic protection device in the left internal carotid artery.    Placement of an additional 9 mm x 7 mm x 40 mm exact stent left internal carotid artery.   The patient denies interval amaurosis fugax. There is no recent history of TIA symptoms or focal motor deficits. There is no prior documented CVA.   The patient denies headache.   The patient is taking enteric-coated aspirin  81 mg daily.   No recent shortening of the patient's walking distance or new symptoms consistent with claudication.  No history of rest pain symptoms. No new ulcers or wounds of the lower extremities have occurred.   There is no history of DVT, PE or superficial thrombophlebitis. No recent episodes of angina or shortness of breath documented.   Duplex ultrasound of the carotid arteries obtained today demonstrates RICA 1-39% and LICA 40-59%.  Slight increase distal left ICA compared to the previous study.   Current Meds  Medication Sig   acetaminophen  (TYLENOL ) 325 MG tablet Take 2 tablets (650 mg total) by mouth every 6 (six) hours as needed for mild pain (or Fever >/= 101).   aspirin  EC 81 MG tablet Take 2 tablets (162 mg total) by mouth daily. Swallow whole.   azelastine (ASTELIN) 0.1 % nasal spray Place 1 spray into both nostrils 2 (two) times daily as needed for rhinitis or allergies. Use in each nostril as directed   buPROPion (WELLBUTRIN XL) 150 MG 24 hr tablet Take 150 mg by mouth daily.    Calcium  Carb-Cholecalciferol  (CALCIUM  + D3) 250-3 MG-MCG TABS 1 tablet Orally Once a day   cefpodoxime  (VANTIN ) 200 MG tablet Take 1 tablet (200 mg total) by mouth 2 (two) times daily.   clopidogrel  (PLAVIX ) 75 MG tablet Take 1 tablet (75 mg total) by mouth daily.   DILT-XR 240 MG 24 hr capsule Take 1 capsule by mouth daily.   diphenhydrAMINE  (BENADRYL  ALLERGY) 25 mg capsule Take 1 capsule (25 mg total) by mouth every 8 (eight) hours for 3 days.   docusate sodium  (COLACE) 100 MG capsule Take 100 mg by mouth daily.   EUTHYROX  25 MCG tablet Take 25 mcg by mouth daily before breakfast.   famotidine  (PEPCID ) 40 MG tablet Take 40 mg by mouth every morning.   fluticasone-salmeterol (ADVAIR) 250-50 MCG/ACT AEPB Inhale 1 puff into the lungs 2 (two) times daily as needed (asthma).   gabapentin  (NEURONTIN ) 300 MG capsule Take 300 mg by mouth at bedtime.   isosorbide  mononitrate (IMDUR ) 60 MG 24 hr tablet Take 60 mg by mouth every evening.   loratadine  (CLARITIN ) 10 MG tablet Take 10 mg by mouth every morning.   metFORMIN (GLUCOPHAGE-XR) 750 MG 24 hr tablet Take 750 mg by mouth daily with breakfast.   metoprolol  succinate (TOPROL -XL) 100 MG 24 hr tablet Take 100 mg by mouth every evening.  Take with or immediately following a meal.   Multiple Vitamin (MULTIVITAMIN) tablet Take 1 tablet by mouth daily.   Naphazoline-Pheniramine (EQ EYE ALLERGY RELIEF) 0.027-0.315 % SOLN Place 1 drop into both eyes daily as needed (eye allergies).   rosuvastatin  (CRESTOR ) 40 MG tablet Take 40 mg by mouth every evening.   vitamin C  (ASCORBIC ACID ) 500 MG tablet Take 500 mg by mouth daily.    Past Medical History:  Diagnosis Date   Acute ischemic left MCA stroke (HCC) 04/11/2016   a.) small LEFT frontal MCA territory infarct   Anxiety    Arthritis    Asthma    B12 deficiency    Carotid stenosis, bilateral    a.) Carotid doppler 04/10/2016: mild (1-49%) BILATERAL ICAs.   Chronic low back pain    Colon polyp     Coronary artery disease 2011   a.) LHC 2011: 50% mLAD; no intervention opting for med mgmt. b.) LHC 06/04/2020: 70% p-mLAD, 90% oD1, 70% RI, 70-80% Cx marginal branch, 60% RCA; anatomy not ideally suited for PCI --> CVTS consult for CABG consideration recommended for refractory symptoms.   Depression    Diastolic dysfunction    a.)  TTE 04/11/2016: EF 60%; no RWMAs; mild MR; mild LA dilation; G1DD.   Female cystocele    GERD (gastroesophageal reflux disease)    Hyperlipidemia    Hypertension    Hypothyroidism    Incontinence in female    Long term current use of antithrombotics/antiplatelets    a.) DAPT therapy (ASA + clopidogrel )   OAB (overactive bladder)    Obesity    Paresthesias in left hand    PSVT (paroxysmal supraventricular tachycardia) (HCC)    Tachycardia 2010   TIA (transient ischemic attack)    Vitamin D deficiency     Past Surgical History:  Procedure Laterality Date   ABDOMINAL HYSTERECTOMY     CARDIAC CATHETERIZATION  05/16/2009   50% to the mid LAD lesion   CAROTID PTA/STENT INTERVENTION Left 06/07/2023   Procedure: CAROTID PTA/STENT INTERVENTION;  Surgeon: Jama Cordella MATSU, MD;  Location: [facility];  Service: Cardiovascular;  Laterality: Left;   CARPAL TUNNEL RELEASE Bilateral    CERVICAL FUSION     COLONOSCOPY WITH PROPOFOL  N/A 05/21/2021   Procedure: COLONOSCOPY WITH PROPOFOL ;  Surgeon: Maryruth Ole DASEN, MD;  Location: [facility];  Service: Endoscopy;  Laterality: N/A;  PLAVIX    KNEE ARTHROSCOPY Left    PARTIAL HYSTERECTOMY  05/16/1997   REVERSE SHOULDER ARTHROPLASTY Right 08/17/2021   Procedure: REVERSE SHOULDER ARTHROPLASTY WITH BICEPS TENODESIS;  Surgeon: Edie Norleen PARAS, MD;  Location: [facility];  Service: Orthopedics;  Laterality: Right;   TEE WITHOUT CARDIOVERSION N/A 06/13/2016   Procedure: TRANSESOPHAGEAL ECHOCARDIOGRAM (TEE);  Surgeon: Deatrice DELENA Cage, MD;  Location: [facility];  Service: Cardiovascular;  Laterality: N/A;    TONSILLECTOMY     TUBAL LIGATION      Social History Social History   Tobacco Use   Smoking status: Never    Passive exposure: Never   Smokeless tobacco: Never  Vaping Use   Vaping status: Never Used  Substance Use Topics   Alcohol use: No   Drug use: No    Family History Family History  Problem Relation Age of Onset   Hypertension Mother    Heart attack Father 109       MI   Heart disease Father    Hypertension Father    Hypertension Sister    Hyperlipidemia Sister  Heart attack Brother        cardiac arrest   Hypertension Sister    Hyperlipidemia Sister    Breast cancer Neg Hx     Allergies  Allergen Reactions   Doxycycline     Angioedema + itching    Sulfa Antibiotics Hives    Welps   Gemtesa  [Vibegron ] Diarrhea     REVIEW OF SYSTEMS (Negative unless checked)  Constitutional: [] Weight loss  [] Fever  [] Chills Cardiac: [] Chest pain   [] Chest pressure   [] Palpitations   [] Shortness of breath when laying flat   [] Shortness of breath with exertion. Vascular:  [x] Pain in legs with walking   [] Pain in legs at rest  [] History of DVT   [] Phlebitis   [] Swelling in legs   [] Varicose veins   [] Non-healing ulcers Pulmonary:   [] Uses home oxygen   [] Productive cough   [] Hemoptysis   [] Wheeze  [] COPD   [] Asthma Neurologic:  [] Dizziness   [] Seizures   [] History of stroke   [] History of TIA  [] Aphasia   [] Vissual changes   [] Weakness or numbness in arm   [] Weakness or numbness in leg Musculoskeletal:   [] Joint swelling   [] Joint pain   [] Low back pain Hematologic:  [] Easy bruising  [] Easy bleeding   [] Hypercoagulable state   [] Anemic Gastrointestinal:  [] Diarrhea   [] Vomiting  [] Gastroesophageal reflux/heartburn   [] Difficulty swallowing. Genitourinary:  [] Chronic kidney disease   [] Difficult urination  [] Frequent urination   [] Blood in urine Skin:  [] Rashes   [] Ulcers  Psychological:  [] History of anxiety   []  History of major depression.  Physical  Examination  Vitals:   01/18/24 0853  BP: 112/68  Pulse: (!) 58  Weight: 157 lb (71.2 kg)  Height: 5' 2 (1.575 m)   Body mass index is 28.72 kg/m. Gen: WD/WN, NAD Head: [place]/AT, No temporalis wasting.  Ear/Nose/Throat: Hearing grossly intact, nares w/o erythema or drainage Eyes: PER, EOMI, sclera nonicteric.  Neck: Supple, no masses.  No bruit or JVD.  Pulmonary:  Good air movement, no audible wheezing, no use of accessory muscles.  Cardiac: RRR, normal S1, S2, no Murmurs. Vascular:  carotid bruit noted Vessel Right Left  Radial Palpable Palpable  Carotid  Palpable  Palpable  Subclav  Palpable Palpable  Gastrointestinal: soft, non-distended. No guarding/no peritoneal signs.  Musculoskeletal: M/S 5/5 throughout.  No visible deformity.  Neurologic: CN 2-12 intact. Pain and light touch intact in extremities.  Symmetrical.  Speech is fluent. Motor exam as listed above. Psychiatric: Judgment intact, Mood & affect appropriate for pt's clinical situation. Dermatologic: No rashes or ulcers noted.  No changes consistent with cellulitis.   CBC Lab Results  Component Value Date   WBC 6.7 08/10/2023   HGB 13.2 08/10/2023   HCT 38.5 08/10/2023   MCV 85.2 08/10/2023   PLT 251 08/10/2023    BMET    Component Value Date/Time   NA 136 08/10/2023 0339   NA 140 05/02/2012 1255   K 3.8 08/10/2023 0339   K 3.3 (L) 05/02/2012 1255   CL 105 08/10/2023 0339   CL 107 05/02/2012 1255   CO2 20 (L) 08/10/2023 0339   CO2 26 05/02/2012 1255   GLUCOSE 165 (H) 08/10/2023 0339   GLUCOSE 122 (H) 05/02/2012 1255   BUN 21 08/10/2023 0339   BUN 13 05/02/2012 1255   CREATININE 0.81 08/10/2023 0339   CREATININE 0.67 05/02/2012 1255   CALCIUM  9.5 08/10/2023 0339   CALCIUM  9.1 05/02/2012 1255   GFRNONAA >60 08/10/2023 9660  GFRNONAA >60 05/02/2012 1255   GFRAA 55 (L) 11/18/2019 1310   GFRAA >60 05/02/2012 1255   CrCl cannot be calculated (Patient's most recent lab result is older than the  maximum 21 days allowed.).  COAG Lab Results  Component Value Date   INR 1.0 06/01/2023   INR 0.91 06/09/2016   INR 0.92 04/10/2016    Radiology No results found.   Assessment/Plan 1. Stenosis of left carotid artery (Primary) Recommend:   The patient is s/p successful left carotid stent   Duplex ultrasound of the carotid arteries obtained today demonstrates RICA 1-39% and LICA 40-59%.  Slight increase distal left ICA compared to the previous study.   Continue dual antiplatelet therapy as prescribed we discussed her dual antiplatelet therapy and have decided to continue the Plavix  with aspirin  as it has been well-tolerated.   Continue management of CAD, HTN and Hyperlipidemia Healthy heart diet,  encouraged exercise at least 4 times per week   The patient's NIHSS score is as follows: 1 Mild: 1 - 5 Mild to Moderately Severe: 5 - 14 Severe: 15 - 24 Very Severe: >25   Follow up in 6 months with duplex ultrasound and physical exam.   - VAS US  CAROTID; Future  2. Primary hypertension Continue antihypertensive medications as already ordered, these medications have been reviewed and there are no changes at this time.  3. Coronary artery disease involving native coronary artery of native heart with angina pectoris (HCC) Continue cardiac and antihypertensive medications as already ordered and reviewed, no changes at this time.  Continue statin as ordered and reviewed, no changes at this time  Nitrates PRN for chest pain  4. Mixed hyperlipidemia Continue statin as ordered and reviewed, no changes at this time    Cordella Shawl, MD  01/18/2024 9:05 AM

## 2024-06-14 ENCOUNTER — Encounter: Payer: Self-pay | Admitting: Ophthalmology

## 2024-06-15 ENCOUNTER — Telehealth: Payer: Self-pay | Admitting: Urology

## 2024-06-15 NOTE — Telephone Encounter (Signed)
 Would you schedule her for follow up?  She has a history of blood in her urine and we need to monitor it.

## 2024-06-18 NOTE — Discharge Instructions (Signed)

## 2024-06-19 ENCOUNTER — Encounter
Admission: RE | Disposition: A | Discharge status: Home / Self Care | Payer: Self-pay | Source: Home / Self Care | Attending: Ophthalmology

## 2024-06-19 ENCOUNTER — Encounter: Payer: Self-pay | Admitting: Anesthesiology

## 2024-06-19 ENCOUNTER — Other Ambulatory Visit: Payer: Self-pay

## 2024-06-19 ENCOUNTER — Encounter: Payer: Self-pay | Admitting: Ophthalmology

## 2024-06-19 ENCOUNTER — Ambulatory Visit: Admission: RE | Admit: 2024-06-19 | Admitting: Ophthalmology

## 2024-06-19 HISTORY — DX: Type 2 diabetes mellitus without complications: E11.9

## 2024-06-19 LAB — GLUCOSE, CAPILLARY: Glucose-Capillary: 106 mg/dL — ABNORMAL HIGH (ref 70–99)

## 2024-06-19 MED ORDER — CEFUROXIME OPHTHALMIC INJECTION 1 MG/0.1 ML
INJECTION | OPHTHALMIC | Status: DC | PRN
  Administered 2024-06-19: 1 mg via INTRACAMERAL

## 2024-06-19 MED ORDER — SIGHTPATH DOSE#1 BSS IO SOLN
INTRAOCULAR | Status: DC | PRN
  Administered 2024-06-19: 15 mL via INTRAOCULAR

## 2024-06-19 MED ORDER — TETRACAINE HCL 0.5 % OP SOLN
OPHTHALMIC | Status: AC
  Filled 2024-06-19: qty 4

## 2024-06-19 MED ORDER — PHENYLEPHRINE HCL 10 % OP SOLN
OPHTHALMIC | Status: AC
  Filled 2024-06-19: qty 5

## 2024-06-19 MED ORDER — BRIMONIDINE TARTRATE-TIMOLOL 0.2-0.5 % OP SOLN
OPHTHALMIC | Status: DC | PRN
  Administered 2024-06-19: 1 [drp] via OPHTHALMIC

## 2024-06-19 MED ORDER — TETRACAINE HCL 0.5 % OP SOLN
1.0000 [drp] | OPHTHALMIC | Status: DC | PRN
  Administered 2024-06-19 (×3): 1 [drp] via OPHTHALMIC

## 2024-06-19 MED ORDER — PHENYLEPHRINE HCL 10 % OP SOLN
1.0000 [drp] | OPHTHALMIC | Status: AC | PRN
  Administered 2024-06-19 (×3): 1 [drp] via OPHTHALMIC

## 2024-06-19 MED ORDER — LIDOCAINE HCL (PF) 2 % IJ SOLN
INTRAOCULAR | Status: DC | PRN
  Administered 2024-06-19: 2 mL

## 2024-06-19 MED ORDER — LACTATED RINGERS IV SOLN: INTRAVENOUS | Status: DC

## 2024-06-19 MED ORDER — SIGHTPATH DOSE#1 NA HYALUR & NA CHOND-NA HYALUR IO KIT
PACK | INTRAOCULAR | Status: DC | PRN
  Administered 2024-06-19: 1 via OPHTHALMIC

## 2024-06-19 MED ORDER — ARMC OPHTHALMIC DILATING DROPS: 1.0000 | OPHTHALMIC | Status: DC | PRN

## 2024-06-19 MED ORDER — MIDAZOLAM HCL 2 MG/2ML IJ SOLN
INTRAMUSCULAR | Status: AC
  Filled 2024-06-19: qty 2

## 2024-06-19 MED ORDER — CYCLOPENTOLATE HCL 2 % OP SOLN
OPHTHALMIC | Status: AC
  Filled 2024-06-19: qty 2

## 2024-06-19 MED ORDER — FENTANYL CITRATE (PF) 100 MCG/2ML IJ SOLN
INTRAMUSCULAR | Status: AC
  Filled 2024-06-19: qty 2

## 2024-06-19 MED ORDER — FENTANYL CITRATE (PF) 100 MCG/2ML IJ SOLN
INTRAMUSCULAR | Status: DC | PRN
  Administered 2024-06-19: 50 ug via INTRAVENOUS

## 2024-06-19 MED ORDER — CYCLOPENTOLATE HCL 2 % OP SOLN
1.0000 [drp] | OPHTHALMIC | Status: DC | PRN
  Administered 2024-06-19 (×3): 1 [drp] via OPHTHALMIC

## 2024-06-19 MED ORDER — MIDAZOLAM HCL (PF) 2 MG/2ML IJ SOLN
INTRAMUSCULAR | Status: DC | PRN
  Administered 2024-06-19 (×2): 1 mg via INTRAVENOUS

## 2024-06-19 MED ORDER — SIGHTPATH DOSE#1 BSS IO SOLN
INTRAOCULAR | Status: DC | PRN
  Administered 2024-06-19: 67 mL via OPHTHALMIC

## 2024-06-19 NOTE — Transfer of Care (Signed)
 Immediate Anesthesia Transfer of Care Note  Patient: Kimberly Trevino  Procedure(s) Performed: PHACOEMULSIFICATION, CATARACT, WITH IOL INSERTION 8.01 00:36.6 (Right)  Patient Location: PACU  Anesthesia Type: MAC  Level of Consciousness: awake, alert  and patient cooperative  Airway and Oxygen Therapy: Patient Spontanous Breathing and Patient connected to supplemental oxygen  Post-op Assessment: Post-op Vital signs reviewed, Patient's Cardiovascular Status Stable, Respiratory Function Stable, Patent Airway and No signs of Nausea or vomiting  Post-op Vital Signs: Reviewed and stable  Complications: No notable events documented.

## 2024-06-19 NOTE — Addendum Note (Signed)
 Addendum  created 06/19/24 0919 by Ashok Sawaya, [facility], MD   Clinical Note Signed

## 2024-06-19 NOTE — Anesthesia Postprocedure Evaluation (Signed)
"   Anesthesia Post Note  Patient: Shaquna Geigle  Procedure(s) Performed: PHACOEMULSIFICATION, CATARACT, WITH IOL INSERTION 8.01 00:36.6 (Right)  Patient location during evaluation: PACU Anesthesia Type: MAC Level of consciousness: awake and alert Pain management: pain level controlled Vital Signs Assessment: post-procedure vital signs reviewed and stable Respiratory status: spontaneous breathing, nonlabored ventilation, respiratory function stable and patient connected to nasal cannula oxygen Cardiovascular status: blood pressure returned to baseline and stable Postop Assessment: no apparent nausea or vomiting Anesthetic complications: no   No notable events documented.   Last Vitals:  Vitals:   06/19/24 0849 06/19/24 0850  BP:  131/71  Pulse: 64   Resp: 19   Temp:    SpO2: 95%     Last Pain:  Vitals:   06/19/24 0850  TempSrc:   PainSc: 0-No pain                 Fairy A Keiandre Cygan      "

## 2024-06-19 NOTE — Anesthesia Preprocedure Evaluation (Signed)
"                                    Anesthesia Evaluation  Patient identified by MRN, date of birth, ID band Patient awake    Reviewed: Allergy & Precautions, H&P , NPO status , Patient's Chart, lab work & pertinent test results  Airway Mallampati: II  TM Distance: >3 FB Neck ROM: Full    Dental no notable dental hx.    Pulmonary neg pulmonary ROS   Pulmonary exam normal breath sounds clear to auscultation       Cardiovascular hypertension, negative cardio ROS Normal cardiovascular exam Rhythm:Regular Rate:Normal     Neuro/Psych negative neurological ROS  negative psych ROS   GI/Hepatic negative GI ROS, Neg liver ROS,,,  Endo/Other  negative endocrine ROSdiabetes    Renal/GU negative Renal ROS  negative genitourinary   Musculoskeletal negative musculoskeletal ROS (+)    Abdominal   Peds negative pediatric ROS (+)  Hematology negative hematology ROS (+)   Anesthesia Other Findings   Reproductive/Obstetrics negative OB ROS                              Anesthesia Physical Anesthesia Plan  ASA: 3  Anesthesia Plan: MAC   Post-op Pain Management:    Induction: Intravenous  PONV Risk Score and Plan:   Airway Management Planned:   Additional Equipment:   Intra-op Plan:   Post-operative Plan: Extubation in OR  Informed Consent: I have reviewed the patients History and Physical, chart, labs and discussed the procedure including the risks, benefits and alternatives for the proposed anesthesia with the patient or authorized representative who has indicated his/her understanding and acceptance.     Dental advisory given  Plan Discussed with: CRNA  Anesthesia Plan Comments:         Anesthesia Quick Evaluation  "

## 2024-06-19 NOTE — Anesthesia Postprocedure Evaluation (Signed)
"   Anesthesia Post Note  Patient: Kimberly Trevino  Procedure(s) Performed: PHACOEMULSIFICATION, CATARACT, WITH IOL INSERTION 8.01 00:36.6 (Right)  Patient location during evaluation: PACU Anesthesia Type: MAC Level of consciousness: awake and alert Pain management: pain level controlled Vital Signs Assessment: post-procedure vital signs reviewed and stable Respiratory status: spontaneous breathing, nonlabored ventilation, respiratory function stable and patient connected to nasal cannula oxygen Cardiovascular status: blood pressure returned to baseline and stable Postop Assessment: no apparent nausea or vomiting Anesthetic complications: no   No notable events documented.   Last Vitals:  Vitals:   06/19/24 0724  BP: 125/62  Pulse: 68  Resp: 20  Temp: (!) 36.4 C  SpO2: 98%    Last Pain:  Vitals:   06/19/24 0724  TempSrc: Temporal  PainSc: 0-No pain                 Fairy A Arika Mainer      "

## 2024-06-19 NOTE — Telephone Encounter (Signed)
 Left message for patient to call office to schedule appointment

## 2024-06-19 NOTE — H&P (Signed)
 " [facility]   Primary Care Physician:  Odell Chard, Edra GRADE, MD Ophthalmologist: Dr. Dene Etienne  Pre-Procedure History & Physical: HPI:  Kimberly Trevino is a 79 y.o. female here for ophthalmic surgery.   Past Medical History:  Diagnosis Date   Acute ischemic left MCA stroke (HCC) 04/11/2016   a.) small LEFT frontal MCA territory infarct and TIA 05/2023   Anxiety    Arthritis    Asthma    B12 deficiency    Carotid stenosis, bilateral    a.) Carotid doppler 04/10/2016: mild (1-49%) BILATERAL ICAs.   Chronic low back pain    Colon polyp    Coronary artery disease 2011   a.) LHC 2011: 50% mLAD; no intervention opting for med mgmt. b.) LHC 06/04/2020: 70% p-mLAD, 90% oD1, 70% RI, 70-80% Cx marginal branch, 60% RCA; anatomy not ideally suited for PCI --> CVTS consult for CABG consideration recommended for refractory symptoms.   Depression    Diabetes mellitus without complication (HCC)    type 2   Diastolic dysfunction    a.)  TTE 04/11/2016: EF 60%; no RWMAs; mild MR; mild LA dilation; G1DD.   Female cystocele    GERD (gastroesophageal reflux disease)    Hyperlipidemia    Hypertension    Hypothyroidism    Incontinence in female    Long term current use of antithrombotics/antiplatelets    a.) DAPT therapy (ASA + clopidogrel )   OAB (overactive bladder)    Obesity    Paresthesias in left hand    PSVT (paroxysmal supraventricular tachycardia)    Tachycardia 2010   TIA (transient ischemic attack)    Vitamin D deficiency     Past Surgical History:  Procedure Laterality Date   ABDOMINAL HYSTERECTOMY     CARDIAC CATHETERIZATION  05/16/2009   50% to the mid LAD lesion   CAROTID PTA/STENT INTERVENTION Left 06/07/2023   Procedure: CAROTID PTA/STENT INTERVENTION;  Surgeon: Jama Cordella MATSU, MD;  Location: [facility];  Service: Cardiovascular;  Laterality: Left;   CARPAL TUNNEL RELEASE Bilateral    CERVICAL FUSION     COLONOSCOPY WITH PROPOFOL   N/A 05/21/2021   Procedure: COLONOSCOPY WITH PROPOFOL ;  Surgeon: Maryruth Ole DASEN, MD;  Location: [facility];  Service: Endoscopy;  Laterality: N/A;  PLAVIX    KNEE ARTHROSCOPY Left    PARTIAL HYSTERECTOMY  05/16/1997   REVERSE SHOULDER ARTHROPLASTY Right 08/17/2021   Procedure: REVERSE SHOULDER ARTHROPLASTY WITH BICEPS TENODESIS;  Surgeon: Edie Norleen PARAS, MD;  Location: [facility];  Service: Orthopedics;  Laterality: Right;   TEE WITHOUT CARDIOVERSION N/A 06/13/2016   Procedure: TRANSESOPHAGEAL ECHOCARDIOGRAM (TEE);  Surgeon: Deatrice DELENA Cage, MD;  Location: [facility];  Service: Cardiovascular;  Laterality: N/A;   TONSILLECTOMY     TUBAL LIGATION      Prior to Admission medications  Medication Sig Start Date End Date Taking? Authorizing Provider  acetaminophen  (TYLENOL ) 325 MG tablet Take 2 tablets (650 mg total) by mouth every 6 (six) hours as needed for mild pain (or Fever >/= 101). 06/13/16  Yes [name], [name], MD  aspirin  EC 81 MG tablet Take 2 tablets (162 mg total) by mouth daily. Swallow whole. 06/08/23  Yes Pace, Brien R, NP  buPROPion (WELLBUTRIN XL) 150 MG 24 hr tablet Take 150 mg by mouth daily. 08/26/23  Yes [provider]  Calcium  Carb-Cholecalciferol  (CALCIUM  + D3) 250-3 MG-MCG TABS 1 tablet Orally Once a day   Yes [provider]  clopidogrel  (PLAVIX ) 75 MG tablet Take  1 tablet (75 mg total) by mouth daily. 06/08/23  Yes Pace, Brien R, NP  Cyanocobalamin (B-12 PO) Take by mouth daily.   Yes [provider]  DILT-XR 240 MG 24 hr capsule Take 1 capsule by mouth daily. 02/21/22  Yes [provider]  docusate sodium  (COLACE) 100 MG capsule Take 100 mg by mouth daily.   Yes [provider]  EUTHYROX  25 MCG tablet Take 25 mcg by mouth daily before breakfast. 11/04/20  Yes [provider]  famotidine  (PEPCID ) 40 MG tablet Take 40 mg by mouth every morning.   Yes [provider]  ferrous sulfate 325 (65 FE) MG EC tablet Take 325  mg by mouth daily.   Yes [provider]  fluticasone-salmeterol (ADVAIR) 250-50 MCG/ACT AEPB Inhale 1 puff into the lungs 2 (two) times daily as needed (asthma).   Yes [provider]  gabapentin  (NEURONTIN ) 300 MG capsule Take 300 mg by mouth at bedtime. 05/19/23  Yes [provider]  insulin glargine (LANTUS) 100 UNIT/ML injection Inject 20 Units into the skin daily.   Yes [provider]  isosorbide  mononitrate (IMDUR ) 60 MG 24 hr tablet Take 60 mg by mouth every evening. 10/27/20  Yes [provider]  loratadine  (CLARITIN ) 10 MG tablet Take 10 mg by mouth every morning.   Yes [provider]  losartan (COZAAR) 25 MG tablet Take 25 mg by mouth daily.   Yes [provider]  metoprolol  succinate (TOPROL -XL) 100 MG 24 hr tablet Take 100 mg by mouth every evening. Take with or immediately following a meal.   Yes [provider]  Multiple Vitamin (MULTIVITAMIN) tablet Take 1 tablet by mouth daily.   Yes [provider]  Naphazoline-Pheniramine (EQ EYE ALLERGY RELIEF) 0.027-0.315 % SOLN Place 1 drop into both eyes daily as needed (eye allergies).   Yes [provider]  rosuvastatin  (CRESTOR ) 40 MG tablet Take 40 mg by mouth every evening.   Yes [provider]  vitamin C  (ASCORBIC ACID ) 500 MG tablet Take 500 mg by mouth daily.   Yes [provider]    Allergies as of 05/27/2024 - Review Complete 01/18/2024  Allergen Reaction Noted   Doxycycline  11/14/2016   Sulfa antibiotics Hives 05/14/2012   Gemtesa  [vibegron ] Diarrhea 04/27/2023    Family History  Problem Relation Age of Onset   Hypertension Mother    Heart attack Father 62       MI   Heart disease Father    Hypertension Father    Hypertension Sister    Hyperlipidemia Sister    Heart attack Brother        cardiac arrest   Hypertension Sister    Hyperlipidemia Sister    Breast cancer Neg Hx     Social History    Socioeconomic History   Marital status: Single    Spouse name: Not on file   Number of children: Not on file   Years of education: Not on file   Highest education level: Not on file  Occupational History   Not on file  Tobacco Use   Smoking status: Never    Passive exposure: Never   Smokeless tobacco: Never  Vaping Use   Vaping status: Never Used  Substance and Sexual Activity   Alcohol use: No   Drug use: No   Sexual activity: Not on file  Other Topics Concern   Not on file  Social History Narrative   Not on file   Social  Drivers of Health   Tobacco Use: Low Risk (06/19/2024)   Patient History    Smoking Tobacco Use: Never    Smokeless Tobacco Use: Never    Passive Exposure: Never  Financial Resource Strain: Low Risk  (01/23/2023)   Received from [facility]   Overall Financial Resource Strain (CARDIA)    Difficulty of Paying Living Expenses: Not hard at all  Food Insecurity: Patient Declined (06/07/2023)   Hunger Vital Sign    Worried About Running Out of Food in the Last Year: Patient declined    Ran Out of Food in the Last Year: Patient declined  Transportation Needs: No Transportation Needs (06/07/2023)   PRAPARE - Administrator, Civil Service (Medical): No    Lack of Transportation (Non-Medical): No  Physical Activity: Not on file  Stress: Not on file  Social Connections: Unknown (06/08/2023)   Social Connection and Isolation Panel    Frequency of Communication with Friends and Family: More than three times a week    Frequency of Social Gatherings with Friends and Family: More than three times a week    Attends Religious Services: Patient declined    Database Administrator or Organizations: Patient declined    Attends Banker Meetings: Patient declined    Marital Status: Patient declined  Intimate Partner Violence: Not At Risk (06/07/2023)   Humiliation, Afraid, Rape, and Kick questionnaire    Fear of Current or  Ex-Partner: No    Emotionally Abused: No    Physically Abused: No    Sexually Abused: No  Depression (PHQ2-9): Not on file  Alcohol Screen: Not on file  Housing: Low Risk  (06/15/2023)   [facility] from [facility]   Epic    In the last 12 months, was there a time when you were not able to pay the mortgage or rent on time?: No    In the past 12 months, how many times have you moved where you were living?: 0    At any time in the past 12 months, were you homeless or living in a shelter (including now)?: No  Recent Concern: Housing - High Risk (06/02/2023)   Housing Stability Vital Sign    Unable to Pay for Housing in the Last Year: Yes    Number of Times Moved in the Last Year: Not on file    Homeless in the Last Year: Yes  Utilities: Not At Risk (06/07/2023)   [facility] Utilities    Threatened with loss of utilities: No  Health Literacy: Not on file    Review of Systems: See HPI, otherwise negative ROS  Physical Exam: BP 125/62   Pulse 68   Temp (!) 97.5 F (36.4 C) (Temporal)   Resp 20   Ht 5' 2.01 (1.575 m)   Wt 72.5 kg   SpO2 98%   BMI 29.22 kg/m  General:   Alert,  pleasant and cooperative in NAD Head:  Normocephalic and atraumatic. Lungs:  Clear to auscultation.    Heart:  Regular rate and rhythm.   Impression/Plan: Kimberly Trevino is here for ophthalmic surgery.  Risks, benefits, limitations, and alternatives regarding ophthalmic surgery have been reviewed with the patient.  Questions have been answered.  All parties agreeable.   MITTIE GASKIN, MD  06/19/2024, 8:06 AM  "

## 2024-06-19 NOTE — Op Note (Signed)
 LOCATION:  [facility]   PREOPERATIVE DIAGNOSIS:    Nuclear sclerotic cataract right eye. H25.11   POSTOPERATIVE DIAGNOSIS:  Nuclear sclerotic cataract right eye.     PROCEDURE:  Phacoemusification with posterior chamber intraocular lens placement of the right eye   ULTRASOUND TIME: Procedures: PHACOEMULSIFICATION, CATARACT, WITH IOL INSERTION 8.01 00:36.6 (Right)  LENS:   Implant Name Type Inv. Item Serial No. Manufacturer Lot No. LRB No. Used Action  LENS IOL TECNIS EYHANCE 20.5 - [id number] Intraocular Lens LENS IOL TECNIS EYHANCE 20.5 [id number] [employer]  Right 1 Implanted         SURGEON:  Dene FABIENE Etienne, MD   ANESTHESIA:  Topical with tetracaine  drops and 2% Xylocaine  jelly, augmented with 1% preservative-free intracameral lidocaine .    COMPLICATIONS:  None.   DESCRIPTION OF PROCEDURE:  The patient was identified in the holding room and transported to the operating room and placed in the supine position under the operating microscope.  The right eye was identified as the operative eye and it was prepped and draped in the usual sterile ophthalmic fashion.   A 1 millimeter clear-corneal paracentesis was made at the 12:00 position.  0.5 ml of preservative-free 1% lidocaine  was injected into the anterior chamber. The anterior chamber was filled with Viscoat viscoelastic.  A 2.4 millimeter keratome was used to make a near-clear corneal incision at the 9:00 position.  A curvilinear capsulorrhexis was made with a cystotome and capsulorrhexis forceps.  Balanced salt solution was used to hydrodissect and hydrodelineate the nucleus.   Phacoemulsification was then used in stop and chop fashion to remove the lens nucleus and epinucleus.  The remaining cortex was then removed using the irrigation and aspiration handpiece. Provisc was then placed into the capsular bag to distend it for lens placement.  A lens was then injected into the capsular bag.  The remaining  viscoelastic was aspirated.   Wounds were hydrated with balanced salt solution.  The anterior chamber was inflated to a physiologic pressure with balanced salt solution.  No wound leaks were noted. Cefuroxime  0.1 ml of a 10mg /ml solution was injected into the anterior chamber for a dose of 1 mg of intracameral antibiotic at the completion of the case.   Timolol  and Brimonidine  drops were applied to the eye.  The patient was taken to the recovery room in stable condition without complications of anesthesia or surgery.   Jannice Beitzel 06/19/2024, 8:43 AM

## 2024-07-03 ENCOUNTER — Encounter: Admission: RE | Payer: Self-pay | Source: Home / Self Care

## 2024-07-03 ENCOUNTER — Ambulatory Visit: Admission: RE | Admit: 2024-07-03 | Source: Home / Self Care | Admitting: Ophthalmology

## 2024-07-03 SURGERY — PHACOEMULSIFICATION, CATARACT, WITH IOL INSERTION
Anesthesia: Topical | Laterality: Left

## 2024-07-18 ENCOUNTER — Encounter (INDEPENDENT_AMBULATORY_CARE_PROVIDER_SITE_OTHER)

## 2024-07-18 ENCOUNTER — Ambulatory Visit (INDEPENDENT_AMBULATORY_CARE_PROVIDER_SITE_OTHER): Admitting: Vascular Surgery
